# Patient Record
Sex: Female | Born: 1955 | Race: White | Hispanic: No | Marital: Married | Smoking: Never smoker
Health system: Southern US, Community
[De-identification: ages and names within clinical notes are randomized; demographics above are authoritative.]

## PROBLEM LIST (undated history)

## (undated) DIAGNOSIS — M1990 Unspecified osteoarthritis, unspecified site: Secondary | ICD-10-CM

## (undated) DIAGNOSIS — K219 Gastro-esophageal reflux disease without esophagitis: Secondary | ICD-10-CM

## (undated) DIAGNOSIS — F32A Depression, unspecified: Secondary | ICD-10-CM

## (undated) DIAGNOSIS — C4490 Unspecified malignant neoplasm of skin, unspecified: Secondary | ICD-10-CM

## (undated) DIAGNOSIS — E079 Disorder of thyroid, unspecified: Secondary | ICD-10-CM

## (undated) DIAGNOSIS — E785 Hyperlipidemia, unspecified: Secondary | ICD-10-CM

## (undated) DIAGNOSIS — E119 Type 2 diabetes mellitus without complications: Secondary | ICD-10-CM

## (undated) DIAGNOSIS — K635 Polyp of colon: Secondary | ICD-10-CM

## (undated) DIAGNOSIS — C44310 Basal cell carcinoma of skin of unspecified parts of face: Secondary | ICD-10-CM

## (undated) DIAGNOSIS — K589 Irritable bowel syndrome without diarrhea: Secondary | ICD-10-CM

## (undated) DIAGNOSIS — I10 Essential (primary) hypertension: Secondary | ICD-10-CM

## (undated) DIAGNOSIS — F419 Anxiety disorder, unspecified: Secondary | ICD-10-CM

## (undated) DIAGNOSIS — K449 Diaphragmatic hernia without obstruction or gangrene: Secondary | ICD-10-CM

## (undated) DIAGNOSIS — G4733 Obstructive sleep apnea (adult) (pediatric): Principal | ICD-10-CM

## (undated) DIAGNOSIS — Z201 Contact with and (suspected) exposure to tuberculosis: Secondary | ICD-10-CM

## (undated) DIAGNOSIS — R87619 Unspecified abnormal cytological findings in specimens from cervix uteri: Secondary | ICD-10-CM

## (undated) DIAGNOSIS — IMO0002 Reserved for concepts with insufficient information to code with codable children: Secondary | ICD-10-CM

## (undated) DIAGNOSIS — G43909 Migraine, unspecified, not intractable, without status migrainosus: Secondary | ICD-10-CM

## (undated) DIAGNOSIS — R7611 Nonspecific reaction to tuberculin skin test without active tuberculosis: Secondary | ICD-10-CM

## (undated) DIAGNOSIS — F329 Major depressive disorder, single episode, unspecified: Secondary | ICD-10-CM

## (undated) HISTORY — DX: Type 2 diabetes mellitus without complications: E11.9

## (undated) HISTORY — PX: COLONOSCOPY: SHX174

## (undated) HISTORY — DX: Major depressive disorder, single episode, unspecified: F32.9

## (undated) HISTORY — DX: Disorder of thyroid, unspecified: E07.9

## (undated) HISTORY — DX: Irritable bowel syndrome, unspecified: K58.9

## (undated) HISTORY — DX: Nonspecific reaction to tuberculin skin test without active tuberculosis: R76.11

## (undated) HISTORY — DX: Unspecified abnormal cytological findings in specimens from cervix uteri: R87.619

## (undated) HISTORY — DX: Contact with and (suspected) exposure to tuberculosis: Z20.1

## (undated) HISTORY — DX: Unspecified malignant neoplasm of skin, unspecified: C44.90

## (undated) HISTORY — DX: Essential (primary) hypertension: I10

## (undated) HISTORY — DX: Gastro-esophageal reflux disease without esophagitis: K21.9

## (undated) HISTORY — DX: Hyperlipidemia, unspecified: E78.5

## (undated) HISTORY — PX: POLYPECTOMY: SHX149

## (undated) HISTORY — DX: Obstructive sleep apnea (adult) (pediatric): G47.33

## (undated) HISTORY — DX: Reserved for concepts with insufficient information to code with codable children: IMO0002

## (undated) HISTORY — DX: Anxiety disorder, unspecified: F41.9

## (undated) HISTORY — DX: Unspecified osteoarthritis, unspecified site: M19.90

## (undated) HISTORY — DX: Depression, unspecified: F32.A

## (undated) HISTORY — DX: Polyp of colon: K63.5

## (undated) HISTORY — DX: Basal cell carcinoma of skin of unspecified parts of face: C44.310

## (undated) HISTORY — DX: Migraine, unspecified, not intractable, without status migrainosus: G43.909

## (undated) HISTORY — DX: Diaphragmatic hernia without obstruction or gangrene: K44.9

---

## 1980-05-24 DIAGNOSIS — IMO0002 Reserved for concepts with insufficient information to code with codable children: Secondary | ICD-10-CM

## 1980-05-24 HISTORY — DX: Reserved for concepts with insufficient information to code with codable children: IMO0002

## 1986-05-24 DIAGNOSIS — R87619 Unspecified abnormal cytological findings in specimens from cervix uteri: Secondary | ICD-10-CM

## 1986-05-24 HISTORY — DX: Unspecified abnormal cytological findings in specimens from cervix uteri: R87.619

## 1986-05-24 HISTORY — PX: VAGINAL HYSTERECTOMY: SUR661

## 1993-05-24 DIAGNOSIS — R7611 Nonspecific reaction to tuberculin skin test without active tuberculosis: Secondary | ICD-10-CM

## 1993-05-24 HISTORY — DX: Nonspecific reaction to tuberculin skin test without active tuberculosis: R76.11

## 2000-01-14 ENCOUNTER — Other Ambulatory Visit: Admission: RE | Admit: 2000-01-14 | Discharge: 2000-01-14 | Payer: Self-pay | Admitting: Obstetrics and Gynecology

## 2001-01-17 ENCOUNTER — Other Ambulatory Visit: Admission: RE | Admit: 2001-01-17 | Discharge: 2001-01-17 | Payer: Self-pay | Admitting: Obstetrics and Gynecology

## 2002-01-19 ENCOUNTER — Other Ambulatory Visit: Admission: RE | Admit: 2002-01-19 | Discharge: 2002-01-19 | Payer: Self-pay | Admitting: Obstetrics and Gynecology

## 2003-01-21 ENCOUNTER — Other Ambulatory Visit: Admission: RE | Admit: 2003-01-21 | Discharge: 2003-01-21 | Payer: Self-pay | Admitting: Obstetrics and Gynecology

## 2004-01-28 ENCOUNTER — Other Ambulatory Visit: Admission: RE | Admit: 2004-01-28 | Discharge: 2004-01-28 | Payer: Self-pay | Admitting: Obstetrics and Gynecology

## 2004-03-10 ENCOUNTER — Encounter
Admission: RE | Admit: 2004-03-10 | Discharge: 2004-06-08 | Payer: Self-pay | Admitting: Physical Medicine and Rehabilitation

## 2004-05-24 HISTORY — PX: MOHS SURGERY: SHX181

## 2006-02-18 ENCOUNTER — Encounter: Admission: RE | Admit: 2006-02-18 | Discharge: 2006-02-18 | Payer: Self-pay | Admitting: Obstetrics and Gynecology

## 2006-03-01 ENCOUNTER — Ambulatory Visit: Admission: RE | Admit: 2006-03-01 | Discharge: 2006-03-01 | Payer: Self-pay | Admitting: Gynecologic Oncology

## 2006-05-24 DIAGNOSIS — C44310 Basal cell carcinoma of skin of unspecified parts of face: Secondary | ICD-10-CM

## 2006-05-24 HISTORY — DX: Basal cell carcinoma of skin of unspecified parts of face: C44.310

## 2006-06-21 ENCOUNTER — Ambulatory Visit: Payer: Self-pay | Admitting: Internal Medicine

## 2006-06-24 LAB — HM COLONOSCOPY

## 2006-07-04 ENCOUNTER — Encounter (INDEPENDENT_AMBULATORY_CARE_PROVIDER_SITE_OTHER): Payer: Self-pay | Admitting: *Deleted

## 2006-07-04 ENCOUNTER — Ambulatory Visit: Payer: Self-pay | Admitting: Internal Medicine

## 2006-07-04 DIAGNOSIS — K5730 Diverticulosis of large intestine without perforation or abscess without bleeding: Secondary | ICD-10-CM | POA: Insufficient documentation

## 2006-07-04 DIAGNOSIS — D126 Benign neoplasm of colon, unspecified: Secondary | ICD-10-CM | POA: Insufficient documentation

## 2007-02-15 ENCOUNTER — Ambulatory Visit: Payer: Self-pay | Admitting: Internal Medicine

## 2007-08-04 DIAGNOSIS — K589 Irritable bowel syndrome without diarrhea: Secondary | ICD-10-CM | POA: Insufficient documentation

## 2007-08-04 DIAGNOSIS — K219 Gastro-esophageal reflux disease without esophagitis: Secondary | ICD-10-CM

## 2008-03-22 ENCOUNTER — Other Ambulatory Visit: Admission: RE | Admit: 2008-03-22 | Discharge: 2008-03-22 | Payer: Self-pay | Admitting: Obstetrics and Gynecology

## 2009-05-24 DIAGNOSIS — E079 Disorder of thyroid, unspecified: Secondary | ICD-10-CM

## 2009-05-24 HISTORY — PX: THYROID SURGERY: SHX805

## 2009-05-24 HISTORY — DX: Disorder of thyroid, unspecified: E07.9

## 2010-02-26 ENCOUNTER — Encounter (HOSPITAL_COMMUNITY)
Admission: RE | Admit: 2010-02-26 | Discharge: 2010-05-22 | Payer: Self-pay | Source: Home / Self Care | Attending: Endocrinology | Admitting: Endocrinology

## 2010-05-22 ENCOUNTER — Ambulatory Visit (HOSPITAL_COMMUNITY)
Admission: RE | Admit: 2010-05-22 | Discharge: 2010-05-22 | Payer: Self-pay | Source: Home / Self Care | Attending: Endocrinology | Admitting: Endocrinology

## 2010-09-18 ENCOUNTER — Encounter: Payer: Self-pay | Admitting: Nurse Practitioner

## 2010-09-18 DIAGNOSIS — I10 Essential (primary) hypertension: Secondary | ICD-10-CM | POA: Insufficient documentation

## 2010-09-18 DIAGNOSIS — E0590 Thyrotoxicosis, unspecified without thyrotoxic crisis or storm: Secondary | ICD-10-CM

## 2010-09-18 DIAGNOSIS — E785 Hyperlipidemia, unspecified: Secondary | ICD-10-CM

## 2010-09-18 DIAGNOSIS — G4700 Insomnia, unspecified: Secondary | ICD-10-CM

## 2010-09-18 DIAGNOSIS — E039 Hypothyroidism, unspecified: Secondary | ICD-10-CM | POA: Insufficient documentation

## 2010-09-18 DIAGNOSIS — E8881 Metabolic syndrome: Secondary | ICD-10-CM

## 2010-10-06 NOTE — Assessment & Plan Note (Signed)
[facility]                         GASTROENTEROLOGY OFFICE NOTE   [name], [name]                    MRN:          [id number]  DATE:02/15/2007                            DOB:          06-15-1955    [name] is a very nice 55 year old white female with a history of  gastroesophageal reflux and irritable bowel syndrome. She is here today  to refill her Bentyl. We had her on Levsin sublingually for a long time,  but because the manufacturer stopped making Levsin, she was switched to  Bentyl 10 mg which she takes about 2 or 3 times a week. We did a  colonoscopy on her earlier this year in February 2008 with findings of  two small polyps, one of which was suggestive of neuroma.   She is currently asymptomatic as far as the reflux is concerned being  on:  1. Prilosec 20 mg p.o. b.i.d.  2. Lexapro 20 mg p.o. daily  3. Lipitor 10 mg p.o. daily.  4. Calcium.  5. Mild thistle.  6. Multiple vitamins.  7. Hydrochlorothiazide 12.5 mg 1 p.o. daily.  8. Atenolol 100 mg p.o. daily.  9. Dicyclomine 10 mg p.r.n.   The patient came today to refill her medications.   IMPRESSION:  1. Irritable bowel syndrome.  2. Gastroesophageal reflux, well controlled.   PLAN:  Refill for Bentyl, 6 refills. I will see her on a p.r.n. basis.     Hedwig Morton. Juanda Chance, MD  Electronically Signed    DMB/MedQ  DD: 02/15/2007  DT: 02/16/2007  Job #: [id number]   cc:   Ernestina Penna, M.D.

## 2010-10-09 NOTE — Assessment & Plan Note (Signed)
[facility]                                 ON-CALL NOTE   TOLUWANI, RUDER                    MRN:          [id number]  DATE:07/04/2006                            DOB:          [birth year]    PHONE NUMBER:  [phone].   TIME OF CALL:  7:15 p.m.   I received a call this evening from the patient's husband stating that  his wife underwent colonoscopy today and was having nausea and vomiting.  I then spoke with the patient. After leaving the [facility], she  apparently had a large supper including fettuccine at the [employer].  They acknowledged that this was contrary to discharge instructions. She  subsequently developed problems with abdominal cramping, nausea and  vomiting. She has had one bowel movement with darkish fluid though no  blood. She is having cramping abdominal discomfort though no severe  pain. Currently, she is most troubled by dry heaves. She asks if I might  not call a prescription in for nausea. The patient lives in [place].  We discussed that nausea, vomiting and discomfort may be due to  overeating in the face of sedation and retained air post colonoscopy. I  was very clear however that if she were to develop significant pain that  she should be evaluated immediately. They were agreeable. I called in  Phenergan 25 mg once every 4-6h as needed for nausea to [facility]. I  asked her not to eat but only sip warm liquids until feeling better.  Again, they know to contact me or the office if her problems persist,  worsen or change in a negative manner.     Wilhemina Bonito. Marina Goodell, MD  Electronically Signed    JNP/MedQ  DD: 07/04/2006  DT: 07/05/2006  Job #: [id number]   cc:   Hedwig Morton. Juanda Chance, MD  Iva Boop, MD,FACG

## 2010-10-09 NOTE — Consult Note (Signed)
NAMEJULYANA, [name]           ACCOUNT NO.:  [id number]   MEDICAL RECORD NO.:  [id number]          PATIENT TYPE:  OUT   LOCATION:  GYN                          FACILITY:  [facility]   PHYSICIAN:  John T. Kyla Balzarine, M.D.    DATE OF BIRTH:  [birth year]   DATE OF CONSULTATION:  DATE OF DISCHARGE:                                   CONSULTATION   CHIEF COMPLAINT:  This patient is seen at the request of Dr. Tresa Res for  recommendations regarding persistent adnexal cyst.  Unfortunately, I only  have information from her last visit and ultrasound.   HISTORY OF PRESENT ILLNESS:  This patient has been followed with multiple  ultrasounds for pelvic cysts and states that she will alternate cysts on  the right and left side.  She states that at approximately a year ago she  had cyst on the on the left side.  CA-125 value has been normal on several  occasions, most recently February 16, 2006.  The patient is asymptomatic  and ultrasound performed February 16, 2006, revealed difficulty in  determining the ovaries because of stool and gas.  A 1.6-cm smooth cyst was  seen in the left adnexa.  In the right adnexa, there were small cysts.  An  MRI of the pelvis was performed on February 18, 2006.  The right ovary was  found to measure 3.5 x 2.6 x 2.3-cm with a 1.7-cm cystic lesion compatible  with complex cyst posteriorly, a second cystic lesion medially measuring 1.6  x 1.9-cm, and a third anterior 1.4 x 1.1-cm likely simple cyst.  Immediately  adjacent to the right ovary an 11-mm simple cyst was present.  The left  ovary measured 3.9 x 2.4 x 3-cm and contained a 2-cm x 2.3-cm compatible  with a simple cyst, and a 1.6 x 1-cm second simple cyst.  Of note, the  patient is post vaginal hysterectomy in the past for endometriosis.  She  states that she has had normal LH and FSH values, although these are not  available for review.   PAST MEDICAL HISTORY:  The patient denies major comorbidities.  1. She is  status post TVH.  2. She has anxiety.  3. Low grade, non suicidal depression.   MEDICATIONS:  Include Lexapro, clonazepam, potassium supplementation, and  multivitamins.   ALLERGIES:  NONE KNOWN.   PERSONAL SOCIAL HISTORY:  Denies tobacco or ethanol use.   FAMILY MEDICAL HISTORY:  Noncontributory without gynecologic malignancy.   PHYSICAL EXAMINATION:  GENERAL:  The patient is anxious, alert, and oriented  x3 in no acute distress.  VITAL SIGNS:  Weight 147 pounds and vital signs stable as reflected in the  patient's chart.  LYMPH NODES:  There is no pathologic lymphadenopathy.  LUNGS:  Fields clear.  There is no back or CVA tenderness.  ABDOMEN:  Obese, soft, and benign with no ascites, mass, organomegaly.  EXTREMITIES:  Extremities have full strength and range of motion without  edema.  PELVIC:  External genitalia and BUS are normal.  Bladder and urethra are  normal.  Vaginal mucosa clear.  Bimanual and rectovaginal examinations  reveal  absent uterus and cervix.  No mass or nodularity.   ASSESSMENT:  Likely benign cysts in a patient nearing the end of ovarian  function who continues to have ovulation and has had prior vaginal  hysterectomy.   From my review of the scans and her history, I believe this is  overwhelmingly benign.   RECOMMENDATIONS:  I would recommend the patient be followed at least  symptomatically and with an exam in 6 months.      John T. Kyla Balzarine, M.D.  Electronically Signed     JTS/MEDQ  D:  03/01/2006  T:  03/02/2006  Job:  [id number]   cc:   Telford Nab, R.N.  [address]  [place], [place] [address]   Edwena Felty. Romine, M.D.  Fax: [phone]

## 2010-10-09 NOTE — Assessment & Plan Note (Signed)
[facility]                                 ON-CALL NOTE   [name], [name]                    MRN:          [id number]  DATE:01/09/2007                            DOB:          July 12, 1955    This is a patient of Dr. Regino Schultze with irritable bowel syndrome,  longstanding.  She had been doing well on hyoscyamine, but due to the  lack of production of that, she needs a prescription to tide her over  until she sees Dr. Juanda Chance in followup next month.  I have called in  dicyclomine 10 mg 1 every 6 hours as needed, #120 with 1 refill.     Iva Boop, MD,FACG  Electronically Signed    CEG/MedQ  DD: 01/09/2007  DT: 01/10/2007  Job #: [id number]

## 2011-07-16 ENCOUNTER — Encounter: Payer: Self-pay | Admitting: Internal Medicine

## 2011-08-26 ENCOUNTER — Ambulatory Visit (AMBULATORY_SURGERY_CENTER): Payer: BC Managed Care – PPO | Admitting: *Deleted

## 2011-08-26 VITALS — Ht 65.5 in | Wt 285.0 lb

## 2011-08-26 DIAGNOSIS — Z1211 Encounter for screening for malignant neoplasm of colon: Secondary | ICD-10-CM

## 2011-08-26 MED ORDER — PEG-KCL-NACL-NASULF-NA ASC-C 100 G PO SOLR: ORAL | Status: DC

## 2011-08-26 NOTE — Progress Notes (Signed)
Pt. States she has terrible reflux at night and Her PCP changed her PPI to dexilant which is somewhat better than the Prilosec.  I gave her Reflux information and the Pamphlet on Correction of Acid Reflux by Laparoscopy.  She Has had aspiration pneumonia and is looking for a more permanent solution.. I advised her to discuss it with you.

## 2011-08-27 ENCOUNTER — Encounter: Payer: Self-pay | Admitting: Internal Medicine

## 2011-09-02 ENCOUNTER — Ambulatory Visit: Payer: BC Managed Care – PPO | Attending: Specialist | Admitting: Physical Therapy

## 2011-09-02 DIAGNOSIS — IMO0001 Reserved for inherently not codable concepts without codable children: Secondary | ICD-10-CM | POA: Insufficient documentation

## 2011-09-02 DIAGNOSIS — R5381 Other malaise: Secondary | ICD-10-CM | POA: Insufficient documentation

## 2011-09-02 DIAGNOSIS — M25569 Pain in unspecified knee: Secondary | ICD-10-CM | POA: Insufficient documentation

## 2011-09-03 ENCOUNTER — Ambulatory Visit: Payer: BC Managed Care – PPO | Admitting: Physical Therapy

## 2011-09-06 ENCOUNTER — Ambulatory Visit: Payer: BC Managed Care – PPO | Admitting: Physical Therapy

## 2011-09-08 ENCOUNTER — Encounter: Payer: Self-pay | Admitting: Internal Medicine

## 2011-09-08 ENCOUNTER — Ambulatory Visit (AMBULATORY_SURGERY_CENTER): Payer: BC Managed Care – PPO | Admitting: Internal Medicine

## 2011-09-08 VITALS — BP 158/95 | HR 57 | Temp 98.2°F | Resp 16 | Ht 66.0 in | Wt 285.0 lb

## 2011-09-08 DIAGNOSIS — D128 Benign neoplasm of rectum: Secondary | ICD-10-CM

## 2011-09-08 DIAGNOSIS — D126 Benign neoplasm of colon, unspecified: Secondary | ICD-10-CM

## 2011-09-08 DIAGNOSIS — D129 Benign neoplasm of anus and anal canal: Secondary | ICD-10-CM

## 2011-09-08 DIAGNOSIS — Z1211 Encounter for screening for malignant neoplasm of colon: Secondary | ICD-10-CM

## 2011-09-08 MED ORDER — SODIUM CHLORIDE 0.9 % IV SOLN: 500.0000 mL | INTRAVENOUS | Status: DC

## 2011-09-08 NOTE — Progress Notes (Signed)
Patient did not experience any of the following events: a burn prior to discharge; a fall within the facility; wrong site/side/patient/procedure/implant event; or a hospital transfer or hospital admission upon discharge from the facility. (G8907) Patient did not have preoperative order for IV antibiotic SSI prophylaxis. (G8918)  

## 2011-09-08 NOTE — Op Note (Addendum)
[facility] [address] [place], [place]  [address]  COLONOSCOPY PROCEDURE REPORT  PATIENT:  Olivia, Fowler  MR#:  [id number] BIRTHDATE:  [birth year], 55 yrs. old  GENDER:  female ENDOSCOPIST:  Hedwig Morton. Juanda Chance, MD REF. BY:  Wardell Heath, N.P. PROCEDURE DATE:  09/08/2011 PROCEDURE:  Colonoscopy with biopsy ASA CLASS:  Class III INDICATIONS:  history of polyps colon 2005-polyp colon 2008- neuroma/polyp MEDICATIONS:   MAC sedation, administered by CRNA, propofol (Diprivan) 300 mg  DESCRIPTION OF PROCEDURE:   After the risks and benefits and of the procedure were explained, informed consent was obtained. Digital rectal exam was performed and revealed no rectal masses. The LB 180AL [id number] endoscope was introduced through the anus and advanced to the cecum, which was identified by both the appendix and ileocecal valve.  The quality of the prep was good, using MoviPrep.  The instrument was then slowly withdrawn as the colon was fully examined. <<PROCEDUREIMAGES>>  FINDINGS:  A diminutive polyp was found in the rectum. 3 mm polyp at 5 cm The polyp was removed using cold biopsy forceps (see image7 and image6).  This was otherwise a normal examination of the colon (see image5, image4, image3, and image1).   Retroflexed views in the rectum revealed no abnormalities.    The scope was then withdrawn from the patient and the procedure completed.  COMPLICATIONS:  None ENDOSCOPIC IMPRESSION: 1) Diminutive polyp in the rectum 2) Otherwise normal examination RECOMMENDATIONS: 1) Await pathology results 2) High fiber diet.  REPEAT EXAM:  7-10  ______________________________ Hedwig Morton. Juanda Chance, MD  CC:  n. REVISED:  09/08/2011 10:58 AM eSIGNED:   Hedwig Morton. Jasmane Brockway at 09/08/2011 10:58 AM  Chong Sicilian, [phone]

## 2011-09-08 NOTE — Patient Instructions (Signed)
YOU HAD AN ENDOSCOPIC PROCEDURE TODAY AT THE [facility]: Refer to the procedure report that was given to you for any specific questions about what was found during the examination.  If the procedure report does not answer your questions, please call your gastroenterologist to clarify.  If you requested that your care partner not be given the details of your procedure findings, then the procedure report has been included in a sealed envelope for you to review at your convenience later.  YOU SHOULD EXPECT: Some feelings of bloating in the abdomen. Passage of more gas than usual.  Walking can help get rid of the air that was put into your GI tract during the procedure and reduce the bloating. If you had a lower endoscopy (such as a colonoscopy or flexible sigmoidoscopy) you may notice spotting of blood in your stool or on the toilet paper. If you underwent a bowel prep for your procedure, then you may not have a normal bowel movement for a few days.  DIET: Your first meal following the procedure should be a light meal and then it is ok to progress to your normal diet.  A half-sandwich or bowl of soup is an example of a good first meal.  Heavy or fried foods are harder to digest and may make you feel nauseous or bloated.  Likewise meals heavy in dairy and vegetables can cause extra gas to form and this can also increase the bloating.  Drink plenty of fluids but you should avoid alcoholic beverages for 24 hours.  ACTIVITY: Your care partner should take you home directly after the procedure.  You should plan to take it easy, moving slowly for the rest of the day.  You can resume normal activity the day after the procedure however you should NOT DRIVE or use heavy machinery for 24 hours (because of the sedation medicines used during the test).    SYMPTOMS TO REPORT IMMEDIATELY: A gastroenterologist can be reached at any hour.  During normal business hours, 8:30 AM to 5:00 PM Monday through Friday,  call [phone].  After hours and on weekends, please call the GI answering service at [phone] who will take a message and have the physician on call contact you.   Following lower endoscopy (colonoscopy or flexible sigmoidoscopy):  Excessive amounts of blood in the stool  Significant tenderness or worsening of abdominal pains  Swelling of the abdomen that is new, acute  Fever of 100F or higher    FOLLOW UP: If any biopsies were taken you will be contacted by phone or by letter within the next 1-3 weeks.  Call your gastroenterologist if you have not heard about the biopsies in 3 weeks.  Our staff will call the home number listed on your records the next business day following your procedure to check on you and address any questions or concerns that you may have at that time regarding the information given to you following your procedure. This is a courtesy call and so if there is no answer at the home number and we have not heard from you through the emergency physician on call, we will assume that you have returned to your regular daily activities without incident.  SIGNATURES/CONFIDENTIALITY: You and/or your care partner have signed paperwork which will be entered into your electronic medical record.  These signatures attest to the fact that that the information above on your After Visit Summary has been reviewed and is understood.  Full responsibility of the confidentiality   of this discharge information lies with you and/or your care-partner.     

## 2011-09-08 NOTE — Progress Notes (Signed)
PATIENT STATING SHE HAAS BURSITIS OF LT. KNEE. CAREFULLY POSITIONED PATIENT ON LEFT SIDE WITH PILLOW BETWEEN LEGS.

## 2011-09-09 ENCOUNTER — Telehealth: Payer: Self-pay | Admitting: *Deleted

## 2011-09-10 ENCOUNTER — Ambulatory Visit: Payer: BC Managed Care – PPO | Admitting: *Deleted

## 2011-09-13 ENCOUNTER — Encounter: Payer: Self-pay | Admitting: Internal Medicine

## 2011-09-14 ENCOUNTER — Ambulatory Visit: Payer: BC Managed Care – PPO | Admitting: Physical Therapy

## 2011-09-15 ENCOUNTER — Ambulatory Visit: Payer: BC Managed Care – PPO | Admitting: Physical Therapy

## 2012-06-26 ENCOUNTER — Encounter: Payer: Self-pay | Admitting: *Deleted

## 2012-06-26 ENCOUNTER — Telehealth: Payer: Self-pay | Admitting: Internal Medicine

## 2012-06-26 NOTE — Telephone Encounter (Signed)
Spoke with patient and she is having severe acid reflux. Started getting worse this fall and is now severe. She is taking Dexilant nightly and Zantac OTC in AM. Reports is using anti reflux measures. Scheduled patient on 06/28/12 at 9:00 AM with Dr. Juanda Chance.

## 2012-06-28 ENCOUNTER — Encounter: Payer: Self-pay | Admitting: Internal Medicine

## 2012-06-28 ENCOUNTER — Ambulatory Visit (INDEPENDENT_AMBULATORY_CARE_PROVIDER_SITE_OTHER): Payer: BC Managed Care – PPO | Admitting: Internal Medicine

## 2012-06-28 VITALS — BP 120/80 | HR 66 | Ht 65.5 in | Wt 289.2 lb

## 2012-06-28 DIAGNOSIS — K219 Gastro-esophageal reflux disease without esophagitis: Secondary | ICD-10-CM

## 2012-06-28 MED ORDER — DEXLANSOPRAZOLE 60 MG PO CPDR: 60.0000 mg | DELAYED_RELEASE_CAPSULE | Freq: Two times a day (BID) | ORAL | Status: DC

## 2012-06-28 MED ORDER — METOCLOPRAMIDE HCL 5 MG PO TABS: 5.0000 mg | ORAL_TABLET | Freq: Every day | ORAL | Status: DC

## 2012-06-28 NOTE — Progress Notes (Signed)
[name] [birth year] MRN [id number]   History of Present Illness:  This is a 57 year old white female with severe gastroesophageal reflux. We have seen her in the past for a colorectal screening. Her last colonoscopy in April 2013 showed a hyperplastic polyp. She has had several episodes of waking up at night with severe burning cough and chest burning. She has had hoarseness after each of these episodes. She is on Dexilant 60 mg at bedtime and ranitidine 150 mg in the morning. She has gained about 50 pounds in the last 2 years partly due to thyroid disease. She had 2 episodes of pneumonia last year. She denies any reflux during the day. She sleeps with the head of the bed elevated and eats supper at 5:30 PM.sSe denies dysphagia or odynophagia. She denies any problems with previous delayed gastric emptying.  Past Medical History  Diagnosis Date  . Anxiety   . Arthritis   . Cancer     basal cell and 1 squamous cell on face and neck  . Depression   . GERD (gastroesophageal reflux disease)   . Hypertension   . Hyperlipidemia   . Tuberculosis 1995    Positive TB test/negative chest xray  . Ulcer 1982    gastric  . Thyroid disease     hypothyroid/ radio active iodine  . Hyperplastic colonic polyp   . IBS (irritable bowel syndrome)    Past Surgical History  Procedure Date  . Vaginal hysterectomy 1983  . Mos   . Mohs surgery 2006    left side of face    reports that she has quit smoking. She has never used smokeless tobacco. She reports that she does not drink alcohol or use illicit drugs. family history is not on file.  She is adopted. Allergies  Allergen Reactions  . Lipitor (Atorvastatin Calcium)     myalgia  . Pravastatin Other (See Comments)    Severe pain in feet  . Zocor (Simvastatin - High Dose)     myalgia        Review of Systems: No abdominal pain diarrhea or constipation  The remainder of the 10 point ROS is negative except as outlined in  H&P   Physical Exam: General appearance  Well developed, in no distress. Obese Eyes- non icteric. HEENT nontraumatic, normocephalic. Mouth no lesions, tongue papillated, no cheilosis. Neck supple without adenopathy, thyroid not enlarged, no carotid bruits, no JVD. Lungs Clear to auscultation bilaterally. Cor normal S1, normal S2, regular rhythm, no murmur,  quiet precordium. Abdomen: Large obese abdomen nontender with normoactive bowel sounds. No ascites. Rectal: Not done Extremities no pedal edema. Skin no lesions. Neurological alert and oriented x 3. Psychological normal mood and affect.  Assessment and Plan:  Problem #1 Severe gastroesophageal reflux disease refractory to proton pump inhibitors as well as episodes of nocturnal aspiration. She has a history of pneumonia on 2 occasions last year.  She needs to be fully evaluated and treated aggressively. He have discussed weight loss. She will continue strict antireflux measures. We will proceed with an upper endoscopy and biopsies to rule out Barrett's esophagus. She will also likely need a barium esophagram and consideration of Nissen fundoplication. We will add Reglan 5 mg at bedtime and increase her Dexilant to 60 mg twice a day. I have also mentioned the possibly to of gastric bypass or lap band for weight loss.  Problem #2 Colorectal screening. She is up-to-date on her colonoscopy. Her next exam will be due in 2023.  06/28/2012 [name]

## 2012-06-28 NOTE — Patient Instructions (Addendum)
You have been scheduled for an endoscopy with propofol. Please follow written instructions given to you at your visit today. If you use inhalers (even only as needed) or a CPAP machine, please bring them with you on the day of your procedure.  We have sent the following medications to your pharmacy for you to pick up at your convenience: Dexilant twice daily Reglan 5 mg at bedtime  Continue to take Maalox as needed  We have given you a brochure on Correction of Acid Reflux by Laparoscopy as well as a brochure on GERD to read over.  CC: Dr Paulene Floor

## 2012-06-30 ENCOUNTER — Encounter: Payer: Self-pay | Admitting: Internal Medicine

## 2012-06-30 ENCOUNTER — Telehealth: Payer: Self-pay | Admitting: *Deleted

## 2012-06-30 ENCOUNTER — Ambulatory Visit (AMBULATORY_SURGERY_CENTER): Payer: BC Managed Care – PPO | Admitting: Internal Medicine

## 2012-06-30 VITALS — BP 140/99 | HR 59 | Temp 99.0°F | Resp 18 | Ht 65.0 in | Wt 285.0 lb

## 2012-06-30 DIAGNOSIS — G4734 Idiopathic sleep related nonobstructive alveolar hypoventilation: Secondary | ICD-10-CM

## 2012-06-30 DIAGNOSIS — T17800A Unspecified foreign body in other parts of respiratory tract causing asphyxiation, initial encounter: Secondary | ICD-10-CM

## 2012-06-30 DIAGNOSIS — R05 Cough: Secondary | ICD-10-CM

## 2012-06-30 DIAGNOSIS — K219 Gastro-esophageal reflux disease without esophagitis: Secondary | ICD-10-CM

## 2012-06-30 DIAGNOSIS — K2990 Gastroduodenitis, unspecified, without bleeding: Secondary | ICD-10-CM

## 2012-06-30 DIAGNOSIS — K319 Disease of stomach and duodenum, unspecified: Secondary | ICD-10-CM

## 2012-06-30 DIAGNOSIS — K2970 Gastritis, unspecified, without bleeding: Secondary | ICD-10-CM

## 2012-06-30 MED ORDER — SODIUM CHLORIDE 0.9 % IV SOLN: 500.0000 mL | INTRAVENOUS | Status: DC

## 2012-06-30 NOTE — Patient Instructions (Addendum)

## 2012-06-30 NOTE — Progress Notes (Signed)
Called to room to assist during endoscopic procedure.  Patient ID and intended procedure confirmed with present staff. Received instructions for my participation in the procedure from the performing physician.  

## 2012-06-30 NOTE — Progress Notes (Signed)
Patient did not experience any of the following events: a burn prior to discharge; a fall within the facility; wrong site/side/patient/procedure/implant event; or a hospital transfer or hospital admission upon discharge from the facility. [phone]) Patient did not have preoperative order for IV antibiotic SSI prophylaxis. [phone])   1600-sat patient in chairs in recovery waiting for the nurse from down stairs to call with appointment for barium esophagram, Rene Kocher has been called down stairs and message was left at [phone].

## 2012-06-30 NOTE — Progress Notes (Signed)
Pt stable throughtout procedure HOB elevated  Pt fully awake to PACU

## 2012-06-30 NOTE — Telephone Encounter (Signed)
Per Dr. Juanda Chance, needs barium esophagram. Scheduled at [facility] radiology([place]) on 07/05/12 at 9:15/9:30 AM. No prep. April at [facility] recovery given information to give patient.

## 2012-06-30 NOTE — Op Note (Signed)
[facility] [address] [place], [address]   ENDOSCOPY PROCEDURE REPORT  PATIENT: [name], [name]  MR#: [id number] BIRTHDATE: 27-Sep-1955 , 56  yrs. old GENDER: Female ENDOSCOPIST: Hart Carwin, MD REFERRED BY:  Wardell Heath, N.P. PROCEDURE DATE:  06/30/2012 PROCEDURE:  EGD w/ biopsy ASA CLASS:     Class III INDICATIONS:  Heartburn.   aspirating at night, coughing, hoarsness, sleeps in a recliner, refractory to PPI's. MEDICATIONS: MAC sedation, administered by CRNA and Fentanyl-Detailed 70 mg IV TOPICAL ANESTHETIC: Cetacaine Spray  DESCRIPTION OF PROCEDURE: After the risks benefits and alternatives of the procedure were thoroughly explained, informed consent was obtained.  The LB GIF-H180 [id number] endoscope was introduced through the mouth and advanced to the second portion of the duodenum. Without limitations.  The instrument was slowly withdrawn as the mucosa was fully examined.      esophagus. Subcutaneous mucosa appeared normal throughout the proximal and mid esophagus. Z line appeared slightly irregular. Biopsies were taken to rule out Barrett's esophagus. There was a small hiatal hernia extending 1-2 cm above the Z line it was reducible. There was no definite stricture.  Stomach stomach was insufflated with air and showed normal-appearing gastric folds. There was intense erythema and nodularity of the gastric antrum consistent with acute gastritis. Biopsies were taken from gastric antrum to rule out H. pylori. Gastric outlet was normal. There was no retention of the food or liquid in the stomach. Duodenum duodenal bulb and descending duodenal are normal endoscope was brought back into the stomach retroflexed. Fundus and cardia appeared normal[          The scope was then withdrawn from the patient and the procedure completed.  COMPLICATIONS: There were no complications. ENDOSCOPIC IMPRESSION:  hiatal hernia with gastroesophageal reflux. No  evidence of stricture. Status post biopsies from Z line Moderately severe antral gastritis. Status post biopsies RECOMMENDATIONS:  patient has had severe gastroesophageal reflux which occurs nocturnally and causes aspiration and cough. Her oxygen saturation dropped during the exam to 88% on 4 L of O2. She needs to be evaluated for obstructive sleep apnea and for aspiration. We will set up  pulmonary appointment for her. In the meantime she will continue on proton pump inhibitor and strict antireflux measures. We will obtain barium esophagram to assess esophageal motility in preparation for possible Nissen fundoplication  REPEAT EXAM: no recall  eSigned:  Hart Carwin, MD 06/30/2012 3:27 PM   CC:  PATIENT NAME:  Alessandria, Henken MR#: [id number]

## 2012-06-30 NOTE — Progress Notes (Signed)
No egg or soy allergy. ewm 

## 2012-06-30 NOTE — Telephone Encounter (Signed)
Per Dr. Juanda Chance, patient needs pulmonary referral for hypoxia during procedure(88%) Scheduled patient with Dr. Craige Cotta on 07/19/12 11:00 AM.April in [facility] recovery given this information to give patient.

## 2012-07-03 ENCOUNTER — Telehealth: Payer: Self-pay | Admitting: *Deleted

## 2012-07-03 NOTE — Telephone Encounter (Signed)
  Follow up Call-  Call back number 06/30/2012 09/08/2011  Post procedure Call Back phone  # [phone]  Permission to leave phone message Yes Yes     Patient questions:  Do you have a fever, pain , or abdominal swelling? no Pain Score  0 *  Have you tolerated food without any problems? yes  Have you been able to return to your normal activities? yes  Do you have any questions about your discharge instructions: Diet   no Medications  no Follow up visit  no  Do you have questions or concerns about your Care? no  Actions: * If pain score is 4 or above: No action needed, pain <4.

## 2012-07-04 ENCOUNTER — Ambulatory Visit (HOSPITAL_COMMUNITY)
Admission: RE | Admit: 2012-07-04 | Discharge: 2012-07-04 | Disposition: A | Discharge status: Home / Self Care | Payer: BC Managed Care – PPO | Source: Ambulatory Visit | Attending: Internal Medicine | Admitting: Internal Medicine

## 2012-07-04 ENCOUNTER — Encounter: Payer: Self-pay | Admitting: Internal Medicine

## 2012-07-04 DIAGNOSIS — Z8701 Personal history of pneumonia (recurrent): Secondary | ICD-10-CM | POA: Insufficient documentation

## 2012-07-04 DIAGNOSIS — K449 Diaphragmatic hernia without obstruction or gangrene: Secondary | ICD-10-CM | POA: Insufficient documentation

## 2012-07-04 DIAGNOSIS — K219 Gastro-esophageal reflux disease without esophagitis: Secondary | ICD-10-CM | POA: Insufficient documentation

## 2012-07-05 ENCOUNTER — Other Ambulatory Visit (HOSPITAL_COMMUNITY): Payer: BC Managed Care – PPO

## 2012-07-14 ENCOUNTER — Other Ambulatory Visit: Payer: Self-pay | Admitting: Physician Assistant

## 2012-07-19 ENCOUNTER — Telehealth: Payer: Self-pay | Admitting: *Deleted

## 2012-07-19 ENCOUNTER — Encounter: Payer: Self-pay | Admitting: Pulmonary Disease

## 2012-07-19 ENCOUNTER — Encounter: Payer: Self-pay | Admitting: *Deleted

## 2012-07-19 ENCOUNTER — Ambulatory Visit (INDEPENDENT_AMBULATORY_CARE_PROVIDER_SITE_OTHER)
Admission: RE | Admit: 2012-07-19 | Discharge: 2012-07-19 | Disposition: A | Discharge status: Home / Self Care | Payer: BC Managed Care – PPO | Source: Ambulatory Visit | Attending: Pulmonary Disease | Admitting: Pulmonary Disease

## 2012-07-19 ENCOUNTER — Ambulatory Visit (INDEPENDENT_AMBULATORY_CARE_PROVIDER_SITE_OTHER): Payer: BC Managed Care – PPO | Admitting: Pulmonary Disease

## 2012-07-19 VITALS — BP 116/84 | HR 75 | Temp 98.0°F | Ht 65.5 in | Wt 270.6 lb

## 2012-07-19 DIAGNOSIS — R0683 Snoring: Secondary | ICD-10-CM

## 2012-07-19 NOTE — Progress Notes (Deleted)
  Subjective:    Patient ID: [name], female    DOB: [birth year], 57 y.o.   MRN: [id number]  HPI    Review of Systems  Constitutional: Positive for unexpected weight change. Negative for appetite change.  HENT: Positive for congestion, sore throat, sneezing and trouble swallowing. Negative for ear pain and dental problem.   Respiratory: Positive for cough and shortness of breath.   Cardiovascular: Positive for leg swelling. Negative for chest pain and palpitations.  Gastrointestinal: Positive for abdominal pain.  Musculoskeletal: Positive for arthralgias. Negative for joint swelling.  Skin: Negative for rash.  Neurological: Negative for headaches.  Psychiatric/Behavioral: Positive for dysphoric mood. The patient is nervous/anxious.        Objective:   Physical Exam        Assessment & Plan:

## 2012-07-19 NOTE — Patient Instructions (Signed)
Will arrange for breathing test (PFT) and sleep study Chest xray today Follow up in 2 weeks

## 2012-07-19 NOTE — Progress Notes (Signed)
Chief Complaint  Patient presents with  . Advice Only    c/o loud snoring, stops breathing at night    History of Present Illness: [name] is a 57 y.o. female former smoker evaluation of sleep apnea, and cough in the setting of reflux.  She has a history of reflux, and is folloed by Dr. Juanda Chance.  She reports having EGD recently, and had low oxygen level during procedure.  She is also concerned about constant cough, and possibility of reflux.  She has not had recent PFT or chest xray.    She coughs 24 hours per day, and is always hoarse.  She does not bring up sputum, and denies hemoptysis.  She does get a burning in her chest.  She will occasionally get sinus congestion, but does not usually take anything for this.  She denies any history of allergies or asthma.   She worked as a Runner, broadcasting/film/video.  She was exposed to another teacher who was positive for tuberculosis in 1995.  She was given INH for 6 months.  She had pneumonia twice last year after aspirating.  She did get her pneumococcal vaccination.  She is retired.  She is from [place], and denies any travel history.  She has a Emergency planning/management officer.    She snores, and has trouble with her breathing while asleep.  She can't sleep flat.  She has been told she stops breathing while asleep, and will wake up with a choking sensation.  Her family reports what appears to be a paradoxical breathing pattern while asleep.  She goes to bed at 1030.  She falls asleep quickly, and does not use anything to help her sleep.  She wakes up once to use the bathroom.  She gets out of bed at 8 am.  She feels tired during the day.  She does not use anything to help her stay awake.  The patient denies sleep walking, sleep talking, bruxism, or nightmares.  There is no history of restless legs.  The patient denies sleep hallucinations, sleep paralysis, or cataplexy.  Her Epworth score is 7 out of 24.  Tests: Barium esophogram 07/05/12 >> Prominent, spontaneous GERD  reaching upper thoracic esophagus   [name]  has a past medical history of Anxiety; Arthritis; Cancer; Depression; GERD (gastroesophageal reflux disease); Hypertension; Hyperlipidemia; Tuberculosis (1995); Ulcer (1982); Thyroid disease; Hyperplastic colonic polyp; and IBS (irritable bowel syndrome).  [name]  has past surgical history that includes Vaginal hysterectomy (1983); mos; Mohs surgery (2006); Polypectomy; Colonoscopy; and Thyroid surgery.  Prior to Admission medications   Medication Sig Start Date End Date Taking? Authorizing Provider  atenolol (TENORMIN) 100 MG tablet Take 100 mg by mouth daily.     Yes Historical Provider, MD  Calcium Carbonate-Vitamin D (CALCIUM 600 + D PO) Take 1 tablet by mouth daily.   Yes Historical Provider, MD  clonazePAM (KLONOPIN) 0.5 MG tablet Take 1 tablet by mouth at bedtime. 07/23/11  Yes Historical Provider, MD  dexlansoprazole (DEXILANT) 60 MG capsule Take 1 capsule (60 mg total) by mouth 2 (two) times daily. 06/28/12  Yes Hart Carwin, MD  diazepam (VALIUM) 5 MG tablet Take 5 mg by mouth at bedtime as needed.     Yes Historical Provider, MD  escitalopram (LEXAPRO) 20 MG tablet Take 1 tablet by mouth Daily. 08/06/11  Yes Historical Provider, MD  Fish Oil-Cholecalciferol (FISH OIL + D3) 1200-1000 MG-UNIT CAPS Take 1 capsule by mouth daily.   Yes Historical Provider, MD  hydrochlorothiazide 25  MG tablet Take 25 mg by mouth daily.     Yes Historical Provider, MD  levothyroxine (SYNTHROID, LEVOTHROID) 200 MCG tablet Take 1 tablet by mouth Daily. 157mg  Mon-Friday then 200 mg Sat and Sunday 08/19/11  Yes Historical Provider, MD  metoCLOPramide (REGLAN) 5 MG tablet Take 1 tablet (5 mg total) by mouth at bedtime. 06/28/12  Yes Hart Carwin, MD  Multiple Vitamins-Minerals (MULTI FOR HER 50+) CAPS Take 1 capsule by mouth daily.   Yes Historical Provider, MD  pravastatin (PRAVACHOL) 40 MG tablet Take 40 mg by mouth daily.     Yes Historical Provider,  MD  ranitidine (ZANTAC) 150 MG tablet Take 150 mg by mouth 1 day or 1 dose.   Yes Historical Provider, MD  traMADol (ULTRAM) 50 MG tablet Take 1 tablet by mouth as needed. migraines 08/11/11  Yes Historical Provider, MD  zolpidem (AMBIEN) 10 MG tablet Take 1 tablet by mouth at bedtime. 08/08/11  Yes Historical Provider, MD    Allergies  Allergen Reactions  . Lipitor (Atorvastatin Calcium)     myalgia  . Pravastatin Other (See Comments)    Severe pain in feet  . Zocor (Simvastatin - High Dose)     myalgia    family history includes Lung cancer in her maternal aunt and mother. She is adopted.   reports that she quit smoking about 10 years ago. Her smoking use included Cigarettes. She smoked 0.00 packs per day for 3 years. She has never used smokeless tobacco. She reports that she does not drink alcohol or use illicit drugs.  Review of Systems  Constitutional: Positive for unexpected weight change. Negative for appetite change.  HENT: Positive for congestion, sore throat, sneezing and trouble swallowing. Negative for ear pain and dental problem.   Respiratory: Positive for cough and shortness of breath.   Cardiovascular: Positive for leg swelling. Negative for chest pain and palpitations.  Gastrointestinal: Positive for abdominal pain.  Musculoskeletal: Positive for arthralgias. Negative for joint swelling.  Skin: Negative for rash.  Neurological: Negative for headaches.  Psychiatric/Behavioral: Positive for dysphoric mood. The patient is nervous/anxious.    Physical Exam:  General - No distress ENT - No sinus tenderness, MP 3, no oral exudate, no LAN, no thyromegaly, TM clear, pupils equal/reactive Cardiac - s1s2 regular, no murmur, pulses symmetric Chest - No wheeze/rales/dullness, good air entry, normal respiratory excursion Back - No focal tenderness Abd - Soft, non-tender, no organomegaly, + bowel sounds Ext - No edema Neuro - Normal strength, cranial nerves intact Skin - No  rashes Psych - Normal mood, and behavior  Assessment/Plan:  Coralyn Helling, MD  Pulmonary/Critical Care/Sleep Pager:  [phone] 07/19/2012, 10:41 AM

## 2012-07-19 NOTE — Telephone Encounter (Signed)
Patient called and left a message with her 2 week update. States she is doing some better. States she had a "bad spell 2 days ago." Reglan seems to have helped some. Still has a bad cough. She is seeing Dr. Craige Cotta today at 11:00 AM for her respiratory issues.

## 2012-07-20 ENCOUNTER — Telehealth: Payer: Self-pay | Admitting: Pulmonary Disease

## 2012-07-20 NOTE — Telephone Encounter (Signed)
Dg Chest 2 View  07/19/2012  *RADIOLOGY REPORT*   Clinical Data: Chronic cough, sleep apnea   CHEST - 2 VIEW   Comparison: None.   Findings: The study is limited by patient's body habitus.  No acute infiltrate or pleural effusion.  No pulmonary edema.  Probable small hiatal hernia.   IMPRESSION: No active disease.  Probable small hiatal hernia.     Original Report Authenticated By: Natasha Mead, M.D.     Results d/w pt.

## 2012-07-24 ENCOUNTER — Other Ambulatory Visit: Payer: Self-pay | Admitting: Internal Medicine

## 2012-07-25 ENCOUNTER — Encounter: Payer: Self-pay | Admitting: Pulmonary Disease

## 2012-07-25 DIAGNOSIS — G4733 Obstructive sleep apnea (adult) (pediatric): Secondary | ICD-10-CM

## 2012-07-25 HISTORY — DX: Obstructive sleep apnea (adult) (pediatric): G47.33

## 2012-07-25 NOTE — Assessment & Plan Note (Addendum)
She has chronic cough which is most likely related to her reflux.  She is a former smoker, but I am less concerned about asthma or obstructive lung disease.  She is concerned about chronic aspiration.  To further assess will arrange for chest xray and pulmonary function testing.  She is to follow up with Dr. Juanda Chance about treating her reflux.  Advised her to d/w Dr. Juanda Chance about whether continued use of fish oil could be contributing to some of her GI symptoms.

## 2012-07-25 NOTE — Assessment & Plan Note (Addendum)
She reports snoring, sleep disruption, and daytime sleepiness.  She has history of HTN and depression.  I am concerned she has sleep apnea.  I have explained how sleep apnea can affect the patient's health.  Driving precautions and importance of weight loss were discussed.  Treatment options for sleep apnea were reviewed.  Will arrange for in lab sleep study to further assess.

## 2012-08-01 ENCOUNTER — Ambulatory Visit (HOSPITAL_COMMUNITY)
Admission: RE | Admit: 2012-08-01 | Discharge: 2012-08-01 | Disposition: A | Discharge status: Home / Self Care | Payer: BC Managed Care – PPO | Source: Ambulatory Visit | Attending: Pulmonary Disease | Admitting: Pulmonary Disease

## 2012-08-01 ENCOUNTER — Encounter: Payer: Self-pay | Admitting: Pulmonary Disease

## 2012-08-01 ENCOUNTER — Other Ambulatory Visit: Payer: Self-pay | Admitting: *Deleted

## 2012-08-01 ENCOUNTER — Ambulatory Visit (INDEPENDENT_AMBULATORY_CARE_PROVIDER_SITE_OTHER): Payer: BC Managed Care – PPO | Admitting: Pulmonary Disease

## 2012-08-01 VITALS — BP 118/72 | HR 69 | Temp 98.5°F | Ht 66.5 in | Wt 290.0 lb

## 2012-08-01 DIAGNOSIS — R0609 Other forms of dyspnea: Secondary | ICD-10-CM | POA: Insufficient documentation

## 2012-08-01 DIAGNOSIS — R062 Wheezing: Secondary | ICD-10-CM | POA: Insufficient documentation

## 2012-08-01 DIAGNOSIS — R059 Cough, unspecified: Secondary | ICD-10-CM | POA: Insufficient documentation

## 2012-08-01 DIAGNOSIS — R0989 Other specified symptoms and signs involving the circulatory and respiratory systems: Secondary | ICD-10-CM | POA: Insufficient documentation

## 2012-08-01 DIAGNOSIS — R0602 Shortness of breath: Secondary | ICD-10-CM | POA: Insufficient documentation

## 2012-08-01 LAB — PULMONARY FUNCTION TEST

## 2012-08-01 MED ORDER — ALBUTEROL SULFATE (5 MG/ML) 0.5% IN NEBU
2.5000 mg | INHALATION_SOLUTION | Freq: Once | RESPIRATORY_TRACT | Status: AC
  Administered 2012-08-01: 2.5 mg via RESPIRATORY_TRACT

## 2012-08-01 NOTE — Patient Instructions (Signed)
Will call with results of sleep study Follow up in 2 to 3 months

## 2012-08-01 NOTE — Assessment & Plan Note (Signed)
Likely related to sleep apnea.  Will review her sleep study later this week, and call her with results.

## 2012-08-01 NOTE — Progress Notes (Signed)
Chief Complaint  Patient presents with  . Follow-up    w/ pft. Pt cough is getting better. no wheezing, chest tx. still SOB w/ exertion. Pt will have sleep study thursday night.     History of Present Illness: [name] is a 57 y.o. female former smoker with sleep apnea, and cough in the setting of reflux.  Her cough and reflux have been doing better since she was started on reglan and stopped fish oil supplement.  She is here to review her PFT.  This was normal.  She is scheduled for her sleep study later this week.  She denies chest pain, wheeze, or sputum.   Tests: Barium esophogram 07/05/12 >> Prominent, spontaneous GERD reaching upper thoracic esophagus CXR 07/19/12 >> small hiatal hernia, otherwise normal PFT 08/01/12 >> FEV1 2.50 (87%), FEV1% 85, TLC 5.04 (92%), DLCO 76%   [name]  has a past medical history of Anxiety; Arthritis; Skin cancer; Depression; GERD (gastroesophageal reflux disease); Hypertension; Hyperlipidemia; PPD positive, treated (1995); Ulcer (1982); Thyroid disease; Hyperplastic colonic polyp; and IBS (irritable bowel syndrome).  [name]  has past surgical history that includes Vaginal hysterectomy (1983); mos; Mohs surgery (2006); Polypectomy; Colonoscopy; and Thyroid surgery.  Prior to Admission medications   Medication Sig Start Date End Date Taking? Authorizing Provider  atenolol (TENORMIN) 100 MG tablet Take 100 mg by mouth daily.     Yes Historical Provider, MD  Calcium Carbonate-Vitamin D (CALCIUM 600 + D PO) Take 1 tablet by mouth daily.   Yes Historical Provider, MD  clonazePAM (KLONOPIN) 0.5 MG tablet Take 1 tablet by mouth at bedtime. 07/23/11  Yes Historical Provider, MD  dexlansoprazole (DEXILANT) 60 MG capsule Take 1 capsule (60 mg total) by mouth 2 (two) times daily. 06/28/12  Yes Hart Carwin, MD  diazepam (VALIUM) 5 MG tablet Take 5 mg by mouth at bedtime as needed.     Yes Historical Provider, MD  escitalopram  (LEXAPRO) 20 MG tablet Take 1 tablet by mouth Daily. 08/06/11  Yes Historical Provider, MD  Fish Oil-Cholecalciferol (FISH OIL + D3) 1200-1000 MG-UNIT CAPS Take 1 capsule by mouth daily.   Yes Historical Provider, MD  hydrochlorothiazide 25 MG tablet Take 25 mg by mouth daily.     Yes Historical Provider, MD  levothyroxine (SYNTHROID, LEVOTHROID) 200 MCG tablet Take 1 tablet by mouth Daily. 157mg  Mon-Friday then 200 mg Sat and Sunday 08/19/11  Yes Historical Provider, MD  metoCLOPramide (REGLAN) 5 MG tablet Take 1 tablet (5 mg total) by mouth at bedtime. 06/28/12  Yes Hart Carwin, MD  Multiple Vitamins-Minerals (MULTI FOR HER 50+) CAPS Take 1 capsule by mouth daily.   Yes Historical Provider, MD  pravastatin (PRAVACHOL) 40 MG tablet Take 40 mg by mouth daily.     Yes Historical Provider, MD  ranitidine (ZANTAC) 150 MG tablet Take 150 mg by mouth 1 day or 1 dose.   Yes Historical Provider, MD  traMADol (ULTRAM) 50 MG tablet Take 1 tablet by mouth as needed. migraines 08/11/11  Yes Historical Provider, MD  zolpidem (AMBIEN) 10 MG tablet Take 1 tablet by mouth at bedtime. 08/08/11  Yes Historical Provider, MD    Allergies  Allergen Reactions  . Lipitor (Atorvastatin Calcium)     myalgia  . Pravastatin Other (See Comments)    Severe pain in feet  . Zocor (Simvastatin - High Dose)     myalgia    Physical Exam:  General - No distress ENT - No sinus tenderness, MP  3, no oral exudate, no LAN Cardiac - s1s2 regular, no murmur, pulses symmetric Chest - No wheeze/rales/dullness, good air entry, normal respiratory excursion Back - No focal tenderness Abd - Soft, non-tender, no organomegaly, + bowel sounds Ext - No edema Neuro - Normal strength Skin - No rashes Psych - Normal mood, and behavior  Assessment/Plan:  Coralyn Helling, MD [place] Pulmonary/Critical Care/Sleep Pager:  [phone] 08/01/2012, 4:38 PM

## 2012-08-01 NOTE — Assessment & Plan Note (Signed)
Likely related to reflux, and improved since last visit.  She is to continue dexilant and reglan per GI.  Reviewed potential side effects to monitor for while on reglan.  Her pulmonary evaluation otherwise has been negative for an alternative cause of her cough, and I don't think she needs any additional pulmonary testing at this time.

## 2012-08-03 ENCOUNTER — Ambulatory Visit (HOSPITAL_BASED_OUTPATIENT_CLINIC_OR_DEPARTMENT_OTHER): Payer: BC Managed Care – PPO | Attending: Pulmonary Disease | Admitting: Radiology

## 2012-08-03 VITALS — Ht 65.5 in | Wt 277.0 lb

## 2012-08-03 DIAGNOSIS — I10 Essential (primary) hypertension: Secondary | ICD-10-CM | POA: Insufficient documentation

## 2012-08-03 DIAGNOSIS — Z79899 Other long term (current) drug therapy: Secondary | ICD-10-CM | POA: Insufficient documentation

## 2012-08-03 DIAGNOSIS — G4733 Obstructive sleep apnea (adult) (pediatric): Secondary | ICD-10-CM | POA: Insufficient documentation

## 2012-08-04 ENCOUNTER — Telehealth: Payer: Self-pay | Admitting: Pulmonary Disease

## 2012-08-04 ENCOUNTER — Encounter: Payer: Self-pay | Admitting: Pulmonary Disease

## 2012-08-04 DIAGNOSIS — G4733 Obstructive sleep apnea (adult) (pediatric): Secondary | ICD-10-CM

## 2012-08-04 NOTE — Telephone Encounter (Signed)
PSG 08/03/12 >> AHI 108.6, SpO2 low 83%; CPAP 11 cm H2O >> AHI 4.3, +R, +S, PLMI 6 >>3.5.  Results d/w pt.  Will arrange for CPAP set up.  She has requested to use [employer] [facility].  Will have my nurse schedule ROV 8 weeks after CPAP set up.

## 2012-08-04 NOTE — Telephone Encounter (Signed)
Called and spoke with pt and she stated that she is anxious to get the results of her sleep test.  She was told that she stopped breathing while doing the test and pt is wanting to get set up with cpap asap.  VS please advise if you have seen the results yet.  Pt stated that she will be using [facility] and she has already spoken to them.  Pt stated ok to leave a message on her home phone.  VS please advise. Thanks  Allergies  Allergen Reactions  . Lipitor (Atorvastatin Calcium)     myalgia  . Pravastatin Other (See Comments)    Severe pain in feet  . Zocor (Simvastatin - High Dose)     myalgia

## 2012-08-05 NOTE — Procedures (Signed)
NAME:  [name], [name] NO.:  [id number]  MEDICAL RECORD NO.:  [id number]          PATIENT TYPE:  OUT  LOCATION:  SLEEP CENTER                 FACILITY[facility]  [facility]  PHYSICIAN:  Coralyn Helling, MD        DATE OF BIRTH:  28-Jan-1956  DATE OF STUDY:  08/04/2012                           NOCTURNAL POLYSOMNOGRAM  REFERRING PHYSICIAN:  Coralyn Helling, MD  INDICATION FOR STUDY:  [name] is a 57 year old female, who has a history of hypertension, depression.  She also reports snoring, sleep disruption, and daytime sleepiness.  She is referred to sleep lab for evaluation of hypersomnia with obstructive sleep apnea.  Height is 5 feet 5 inches, weight is 277 pounds, BMI is 45, neck size is 17.5 inches.  EPWORTH SLEEPINESS SCORE:  3.  MEDICATIONS:  Atenolol, calcium, Klonopin, Valium, Lexapro, fish oil, hydrochlorothiazide, levothyroxine, Reglan, multivitamin, Zantac, and Ultram.  SLEEP ARCHITECTURE:  The patient followed a split night study protocol. During the diagnostic portion of the study, total recording time was 167 minutes.  Total sleep time was 121 minutes.  Sleep efficiency was 70%. Sleep latency was 22 minutes.  This portion of study was notable for a lack of slow-wave sleep and REM sleep.  The patient slept in both supine and nonsupine positions.  During the titration portion of study, total recording time was 246 minutes.  Total sleep time was 237 minutes.  Sleep efficiency was 96%. Sleep latency was 1 minute.  REM latency was 74 minutes.  This portion of the study was notable for lack of slow-wave sleep and she slept exclusively in the supine position.  RESPIRATORY DATA:  The average respiratory rate was 20.  Moderate snoring was noted by the technician.  During the diagnostic portion of study, the overall apnea/hypopnea index was 108.6.  The events were exclusively obstructive in nature.  During the titration portion of the study, the patient was started  on CPAP of 5 and increased to 15 cm water.  With CPAP set at 11 cm of water, her apnea-hypopnea index was reduced to 4.3.  At this pressure setting, she was observed in REM sleep and supine sleep.  She was fitted with a medium-sized ResMed Mirage FX mask.  OXYGEN DATA:  The baseline oxygenation was 95%.  The oxygen saturation nadir was 83%.  The patient had good control of her oxygenation with CPAP at 11 cm of water.  The study was conducted without the use of supplemental oxygen.  CARDIAC DATA:  The average heart rate was 66 and the rhythm strip showed normal sinus rhythm with occasional PVCs.  MOVEMENT-PARASOMNIA:  The patient had 1 restroom trip.  The periodic limb movement index was 6 during the diagnostic portion of study and 3.5 in the therapeutic portion of study.  IMPRESSIONS-RECOMMENDATIONS:  This study shows evidence for severe obstructive sleep apnea with an apnea/hypopnea index of 108.6, an oxygen saturation nadir of 83%.  She did very well with CPAP of 11 cm of water.  In addition to diet, exercise, and weight reduction, I would recommend the patient will be started on CPAP of 11 cm water and monitored for her clinical response.     Coralyn Helling, MD  Diplomat, American Board of Sleep Medicine    VS/MEDQ  D:  08/04/2012 16:53:04  T:  08/05/2012 02:39:44  Job:  [id number]

## 2012-08-10 NOTE — Telephone Encounter (Signed)
Pt returned call.  Set an appt w/ VS for 10/06/12 @ 10 AM.    Antionette Fairy

## 2012-08-10 NOTE — Telephone Encounter (Signed)
lmomtcb x1 for pt to schedule OV for 8 weeks

## 2012-08-10 NOTE — Telephone Encounter (Signed)
Noted  

## 2012-08-16 ENCOUNTER — Ambulatory Visit (INDEPENDENT_AMBULATORY_CARE_PROVIDER_SITE_OTHER): Payer: BC Managed Care – PPO

## 2012-08-16 ENCOUNTER — Other Ambulatory Visit: Payer: Self-pay

## 2012-08-16 ENCOUNTER — Ambulatory Visit: Payer: BC Managed Care – PPO | Admitting: Pharmacist

## 2012-08-16 VITALS — Ht 65.5 in | Wt 290.0 lb

## 2012-08-16 DIAGNOSIS — E039 Hypothyroidism, unspecified: Secondary | ICD-10-CM

## 2012-08-16 LAB — HM DEXA SCAN: HM DEXA SCAN: NORMAL

## 2012-08-16 NOTE — Progress Notes (Unsigned)
Osteoporosis Clinic Current Height:        Max Lifetime Height:  5' 5.5" Current Weight:       Max Lifetime Weight:  ***  Ethnicity: Not Hispanic or Latino [1] BP:       HR:         HPI: Does pt already have a diagnosis of:  Osteopenia?  No Osteoporosis?  No  Back Pain?  No       Kyphosis?  No Prior fracture?  No Med(s) for Osteoporosis/Osteopenia:  N/A Med(s) previously tried for Osteoporosis/Osteopenia:  N/A                                                             PMH: Age at menopause:  Hysterectomy in 1986 Hysterectomy?  Yes Oophorectomy?  No HRT? No Steroid Use?  No Thyroid med?  Yes History of cancer?  Yes - skin cancer History of digestive disorders (ie Crohn's)?  Yes Current or previous eating disorders?  No Last Vitamin D Result:  75 (08/2010) Last SCr Result: 0.82 (03/2012)   FH/SH: Family history of osteoporosis?  Unknown  Parent with history of hip fracture?  unknown Family history of breast cancer?  unknown Exercise?  No Caffeine?  Yes 1 cup coffee/day Smoking?  No Alcohol?  No    Calcium Assessment Calcium Intake  # of servings/day  Calcium mg  Milk (8 oz) 1/2 - 1  x  300  = 150 - 300mg   Yogurt (8 oz) occ < daily x  400 =    Cheese (1 oz) 2x/week x  200 =    Non dairy sources   250 mg  Ca supplement MVI qd and Calcium 600mg  1d = 1000mg    Estimated calcium intake per day 1350-1550mg     DEXA Results Date of Test T-Score for AP Spine L1-L4 T-Score for Left Neck of Hip T-Score for Right Neck of Hip  *** *** *** ***                  FRAX 10 year estimate: Total FX risk:  ***  (consider medication if >/= 20%) Hip FX risk:  ***  (consider medication if >/= 3%)  Assessment: ***  Recommendations: Changes in Osteo meds:  *** Weight bearing exercise Educate on fall preventtion - counseling and educational materials provided Recheck DEXA:  ***  Time spent counseling patient:  ***

## 2012-08-17 ENCOUNTER — Telehealth: Payer: Self-pay | Admitting: Nurse Practitioner

## 2012-08-17 NOTE — Telephone Encounter (Signed)
appt made for tomorr. With mmm

## 2012-08-17 NOTE — Telephone Encounter (Signed)
Patient called to make an appt today for severe pain in the tops of both feet and in her legs but more on her right side. Patient was offered an appt 4/16 and she states that is too far out and needs to be seen sooner.

## 2012-08-18 ENCOUNTER — Encounter: Payer: Self-pay | Admitting: Nurse Practitioner

## 2012-08-18 ENCOUNTER — Ambulatory Visit (INDEPENDENT_AMBULATORY_CARE_PROVIDER_SITE_OTHER): Payer: BC Managed Care – PPO | Admitting: Nurse Practitioner

## 2012-08-18 VITALS — BP 128/67 | HR 67 | Temp 98.1°F | Ht 65.5 in | Wt 279.0 lb

## 2012-08-18 DIAGNOSIS — G5793 Unspecified mononeuropathy of bilateral lower limbs: Secondary | ICD-10-CM

## 2012-08-18 DIAGNOSIS — G5790 Unspecified mononeuropathy of unspecified lower limb: Secondary | ICD-10-CM

## 2012-08-18 MED ORDER — GABAPENTIN 300 MG PO CAPS: 300.0000 mg | ORAL_CAPSULE | Freq: Three times a day (TID) | ORAL | Status: DC

## 2012-08-18 NOTE — Patient Instructions (Signed)
Neuropathy Neuropathy means your peripheral nerves are not working normally. Peripheral nerves are the nerves outside the brain and spinal cord. Messages between the brain and the rest of the body do not work properly with peripheral nerve disorders. CAUSES There are many different causes of peripheral nerve disorders. These include:  Injury.   Infections.   Diabetes.   Vitamin deficiency.   Poor circulation.   Alcoholism.   Exposure to toxins.   Drug effects.   Tumors.   Kidney disease.  SYMPTOMS  Tingling, burning, pain, and numbness in the extremities.   Weakness and loss of muscle tone and size.  DIAGNOSIS Blood tests and special studies of nerve function may help confirm the diagnosis.  TREATMENT  Treatment includes adopting healthy life habits.   A good diet, vitamin supplements, and mild pain medicine may be needed.   Avoid known toxins such as alcohol, tobacco, and recreational drugs.   Anti-convulsant medicines are helpful in some types of neuropathy.  Make a follow-up appointment with your caregiver to be sure you are getting better with treatment.  SEEK IMMEDIATE MEDICAL CARE IF:   You have breathing problems.   You have severe or uncontrolled pain.   You notice extreme weakness or you feel faint.   You are not better after 1 week or if you have worse symptoms.  Document Released: 06/17/2004 Document Revised: 01/20/2011 Document Reviewed: 05/10/2005 Exit[employer] Patient Information 2012 [employer]. 

## 2012-08-18 NOTE — Addendum Note (Signed)
Addended by: Prescott Gum on: 08/18/2012 02:42 PM   Modules accepted: Orders

## 2012-08-18 NOTE — Progress Notes (Signed)
  Subjective:    Patient ID: [name], female    DOB: 06/30/1955, 57 y.o.   MRN: [id number]  HPI Patient in complaining of pain Pain tops of bil feet. Started . persistent. Rates pain 8/10, worse at night. Nothing  helps pain. walking increases pain. Describes pain as burning a prickly. Denies numbness.     Review of Systems  Constitutional: Negative.   Respiratory: Negative.   Cardiovascular: Negative.   Musculoskeletal: Negative.        Objective:   Physical Exam  Constitutional: She is oriented to person, place, and time. She appears well-developed and well-nourished.  Cardiovascular: Normal rate, normal heart sounds and intact distal pulses.   Pulmonary/Chest: Effort normal and breath sounds normal.  Neurological: She is alert and oriented to person, place, and time. She displays normal reflexes.  + 4/4 monofilament bil  Skin: Skin is warm and dry.   BP 128/67  Pulse 67  Temp(Src) 98.1 F (36.7 C) (Oral)  Ht 5' 5.5" (1.664 m)  Wt 279 lb (126.554 kg)  BMI 45.71 kg/m2        Assessment & Plan:  1. Neuropathy of both feet - gabapentin (NEURONTIN) 300 MG capsule; Take 1 capsule (300 mg total) by mouth 3 (three) times daily.  Dispense: 90 capsule; Refill: 3 - Anemia panel 7 - Vitamin B12  Mary-Margaret Daphine Deutscher, FNP

## 2012-08-22 ENCOUNTER — Telehealth: Payer: Self-pay | Admitting: Internal Medicine

## 2012-08-22 MED ORDER — DEXLANSOPRAZOLE 60 MG PO CPDR: 60.0000 mg | DELAYED_RELEASE_CAPSULE | Freq: Two times a day (BID) | ORAL | Status: DC

## 2012-08-22 NOTE — Telephone Encounter (Signed)
90 day supply of Dexilant sent to patient's pharmacy. Patient verbalizes understanding.

## 2012-09-01 ENCOUNTER — Ambulatory Visit: Payer: BC Managed Care – PPO

## 2012-10-06 ENCOUNTER — Ambulatory Visit (INDEPENDENT_AMBULATORY_CARE_PROVIDER_SITE_OTHER): Payer: BC Managed Care – PPO | Admitting: Pulmonary Disease

## 2012-10-06 ENCOUNTER — Encounter: Payer: Self-pay | Admitting: Pulmonary Disease

## 2012-10-06 VITALS — BP 138/80 | HR 67 | Temp 98.4°F | Ht 65.5 in | Wt 267.0 lb

## 2012-10-06 DIAGNOSIS — G4733 Obstructive sleep apnea (adult) (pediatric): Secondary | ICD-10-CM

## 2012-10-06 DIAGNOSIS — R05 Cough: Secondary | ICD-10-CM

## 2012-10-06 NOTE — Assessment & Plan Note (Signed)
Likely related to reflux, and improved since last visit.  She is to continue dexilant per GI.

## 2012-10-06 NOTE — Assessment & Plan Note (Signed)
She has severe OSA.  I have reviewed the recent sleep study results with the patient.  We discussed how sleep apnea can affect various health problems including risks for hypertension, cardiovascular disease, and diabetes.  We also discussed how sleep disruption can increase risks for accident, such as while driving.  Weight loss as a means of improving sleep apnea was also reviewed.  Additional treatment options discussed were CPAP therapy, oral appliance, and surgical intervention.  She is doing very well with CPAP 11 cm H2O.  She is compliant with therapy, and reports benefit.

## 2012-10-06 NOTE — Progress Notes (Signed)
Chief Complaint  Patient presents with  . 8 wk follow up    Doing well on cpap overall.  Feels cpap has really helped.  Wearing it everynight for approx 10 hours.  Would like to see about pressure increased a little.  No problems with mask.  Breathing doing well.  Does have some SOB with activities but states this is no problem.  No wheezing, chest tightness, chest pain, or cough.    History of Present Illness: [name] is a 57 y.o. female former smoker with sleep apnea, and cough in the setting of reflux.  Since her last visit she had sleep study which showed severe OSA.  She was started on CPAP 11 cm H2O.  She has full face mask.  She goes to bed at 10 pm and wakes up at 8 am.  She uses her CPAP all night.  She is sleeping better, and feels much better during the day.  She is no longer snoring.  She is no longer having a cough.  She continues to use dexilant.  She is no longer using reglan.  Tests: Barium esophogram 07/05/12 >> Prominent, spontaneous GERD reaching upper thoracic esophagus CXR 07/19/12 >> small hiatal hernia, otherwise normal PFT 08/01/12 >> FEV1 2.50 (87%), FEV1% 85, TLC 5.04 (92%), DLCO 76% PSG 08/03/12 >> AHI 108.6, SpO2 low 83%; CPAP 11 cm H2O >> AHI 4.3, +R, +S, PLMI 6 >>3.5.   [name]  has a past medical history of Anxiety; Arthritis; Skin cancer; Depression; GERD (gastroesophageal reflux disease); Hypertension; Hyperlipidemia; PPD positive, treated (1995); Ulcer (1982); Thyroid disease; Hyperplastic colonic polyp; IBS (irritable bowel syndrome); and OSA (obstructive sleep apnea) (07/25/2012).  [name]  has past surgical history that includes Vaginal hysterectomy (1983); mos; Mohs surgery (2006); Polypectomy; Colonoscopy; and Thyroid surgery.  Prior to Admission medications   Medication Sig Start Date End Date Taking? Authorizing Provider  atenolol (TENORMIN) 100 MG tablet Take 100 mg by mouth daily.     Yes Historical Provider, MD  Calcium  Carbonate-Vitamin D (CALCIUM 600 + D PO) Take 1 tablet by mouth daily.   Yes Historical Provider, MD  clonazePAM (KLONOPIN) 0.5 MG tablet Take 1 tablet by mouth at bedtime. 07/23/11  Yes Historical Provider, MD  dexlansoprazole (DEXILANT) 60 MG capsule Take 1 capsule (60 mg total) by mouth 2 (two) times daily. 06/28/12  Yes Hart Carwin, MD  diazepam (VALIUM) 5 MG tablet Take 5 mg by mouth at bedtime as needed.     Yes Historical Provider, MD  escitalopram (LEXAPRO) 20 MG tablet Take 1 tablet by mouth Daily. 08/06/11  Yes Historical Provider, MD  Fish Oil-Cholecalciferol (FISH OIL + D3) 1200-1000 MG-UNIT CAPS Take 1 capsule by mouth daily.   Yes Historical Provider, MD  hydrochlorothiazide 25 MG tablet Take 25 mg by mouth daily.     Yes Historical Provider, MD  levothyroxine (SYNTHROID, LEVOTHROID) 200 MCG tablet Take 1 tablet by mouth Daily. 157mg  Mon-Friday then 200 mg Sat and Sunday 08/19/11  Yes Historical Provider, MD  metoCLOPramide (REGLAN) 5 MG tablet Take 1 tablet (5 mg total) by mouth at bedtime. 06/28/12  Yes Hart Carwin, MD  Multiple Vitamins-Minerals (MULTI FOR HER 50+) CAPS Take 1 capsule by mouth daily.   Yes Historical Provider, MD  pravastatin (PRAVACHOL) 40 MG tablet Take 40 mg by mouth daily.     Yes Historical Provider, MD  ranitidine (ZANTAC) 150 MG tablet Take 150 mg by mouth 1 day or 1 dose.  Yes Historical Provider, MD  traMADol (ULTRAM) 50 MG tablet Take 1 tablet by mouth as needed. migraines 08/11/11  Yes Historical Provider, MD  zolpidem (AMBIEN) 10 MG tablet Take 1 tablet by mouth at bedtime. 08/08/11  Yes Historical Provider, MD    Allergies  Allergen Reactions  . Lipitor (Atorvastatin Calcium)     myalgia  . Pravastatin Other (See Comments)    Severe pain in feet  . Zocor (Simvastatin - High Dose)     myalgia    Physical Exam:  General - No distress ENT - No sinus tenderness, MP 3, no oral exudate, no LAN Cardiac - s1s2 regular, no murmur Chest - No  wheeze/rales/dullness Back - No focal tenderness Abd - Soft, non-tender Ext - No edema Neuro - Normal strength Skin - No rashes Psych - Normal mood, and behavior  Assessment/Plan:  Olivia Helling, MD [facility] Pager:  [phone] 10/06/2012, 10:02 AM

## 2012-10-06 NOTE — Patient Instructions (Signed)
Follow up in 1 year Call if help needed sooner 

## 2012-10-23 ENCOUNTER — Other Ambulatory Visit: Payer: Self-pay | Admitting: Nurse Practitioner

## 2012-10-25 ENCOUNTER — Telehealth: Payer: Self-pay | Admitting: Nurse Practitioner

## 2012-10-25 NOTE — Telephone Encounter (Signed)
done

## 2012-11-04 ENCOUNTER — Other Ambulatory Visit: Payer: Self-pay | Admitting: Nurse Practitioner

## 2012-11-16 ENCOUNTER — Telehealth: Payer: Self-pay | Admitting: Nurse Practitioner

## 2012-11-16 MED ORDER — SUMATRIPTAN 20 MG/ACT NA SOLN: 1.0000 | NASAL | Status: DC | PRN

## 2012-11-16 NOTE — Telephone Encounter (Signed)
Patient aware that sent to pharamcy

## 2012-11-16 NOTE — Telephone Encounter (Signed)
Please review

## 2012-11-21 ENCOUNTER — Telehealth: Payer: Self-pay | Admitting: Nurse Practitioner

## 2012-11-21 ENCOUNTER — Ambulatory Visit (INDEPENDENT_AMBULATORY_CARE_PROVIDER_SITE_OTHER): Payer: BC Managed Care – PPO | Admitting: Family Medicine

## 2012-11-21 ENCOUNTER — Ambulatory Visit (INDEPENDENT_AMBULATORY_CARE_PROVIDER_SITE_OTHER): Payer: BC Managed Care – PPO

## 2012-11-21 ENCOUNTER — Encounter: Payer: Self-pay | Admitting: Family Medicine

## 2012-11-21 VITALS — BP 147/83 | HR 81 | Temp 101.3°F | Wt 267.0 lb

## 2012-11-21 DIAGNOSIS — R05 Cough: Secondary | ICD-10-CM

## 2012-11-21 DIAGNOSIS — J209 Acute bronchitis, unspecified: Secondary | ICD-10-CM

## 2012-11-21 DIAGNOSIS — R062 Wheezing: Secondary | ICD-10-CM

## 2012-11-21 MED ORDER — ALBUTEROL SULFATE HFA 108 (90 BASE) MCG/ACT IN AERS: 2.0000 | INHALATION_SPRAY | RESPIRATORY_TRACT | Status: DC | PRN

## 2012-11-21 MED ORDER — CEFTRIAXONE SODIUM 1 G IJ SOLR
1.0000 g | Freq: Once | INTRAMUSCULAR | Status: AC
  Administered 2012-11-21: 1 g via INTRAMUSCULAR

## 2012-11-21 MED ORDER — PREDNISONE 50 MG PO TABS: ORAL_TABLET | ORAL | Status: DC

## 2012-11-21 MED ORDER — AZITHROMYCIN 250 MG PO TABS: ORAL_TABLET | ORAL | Status: DC

## 2012-11-21 MED ORDER — METHYLPREDNISOLONE SODIUM SUCC 125 MG IJ SOLR
125.0000 mg | Freq: Once | INTRAMUSCULAR | Status: AC
  Administered 2012-11-21: 125 mg via INTRAMUSCULAR

## 2012-11-21 NOTE — Progress Notes (Signed)
Tolerated solu medrol injection well Tolerated rocephin injection well

## 2012-11-21 NOTE — Patient Instructions (Addendum)
Ceftriaxone injection What is this medicine? CEFTRIAXONE (sef try AX one) is a cephalosporin antibiotic. It is used to treat certain kinds of bacterial infections. It It will not work for colds, flu, or other viral infections. This medicine may be used for other purposes; ask your health care provider or pharmacist if you have questions. What should I tell my health care provider before I take this medicine? They need to know if you have any of these conditions: -any chronic illness -bowel disease, like colitis -both kidney and liver disease -high bilirubin level in newborn patients -an unusual or allergic reaction to ceftriaxone, other cephalosporin or penicillin antibiotics, foods, dyes or preservatives -pregnant or trying to get pregnant -breast-feeding How should I use this medicine? This medicine is injected into a muscle or infused it into a vein. It is usually given in a medical office or clinic. If you are to give this medicine you will be taught how to inject it. Follow instructions carefully. Use your doses at regular intervals. Do not take your medicine more often than directed. Do not skip doses or stop your medicine early even if you feel better. Do not stop taking except on your doctor's advice. Talk to your pediatrician regarding the use of this medicine in children. Special care may be needed. Overdosage: If you think you have taken too much of this medicine contact a poison control center or emergency room at once. NOTE: This medicine is only for you. Do not share this medicine with others. What if I miss a dose? If you miss a dose, take it as soon as you can. If it is almost time for your next dose, take only that dose. Do not take double or extra doses. What may interact with this medicine? Do not take this medicine with any of the following medications: -calcium This medicine may also interact with the following medications: -alcohol -some other  antibiotics -warfarin This list may not describe all possible interactions. Give your health care provider a list of all the medicines, herbs, non-prescription drugs, or dietary supplements you use. Also tell them if you smoke, drink alcohol, or use illegal drugs. Some items may interact with your medicine. What should I watch for while using this medicine? Tell your doctor or health care professional if your symptoms do not improve or if they get worse. Do not treat diarrhea with over the counter products. Contact your doctor if you have diarrhea that lasts more than 2 days or if it is severe and watery. If you are being treated for a sexually transmitted disease, avoid sexual contact until you have finished your treatment. Having sex can infect your sexual partner. Calcium may bind to this medicine and cause lung or kidney problems. Avoid calcium products while taking this medicine and for 48 hours after taking the last dose of this medicine. What side effects may I notice from receiving this medicine? Side effects that you should report to your doctor or health care professional as soon as possible: -allergic reactions like skin rash, itching or hives, swelling of the face, lips, or tongue -breathing problems -fever, chills -irregular heartbeat -pain when passing urine -seizures -stomach pain, cramps -unusual bleeding, bruising -unusually weak or tired Side effects that usually do not require medical attention (report to your doctor or health care professional if they continue or are bothersome): -diarrhea -dizzy, drowsy -headache -nausea, vomiting -pain, swelling, irritation where injected -stomach upset -sweating This list may not describe all possible side effects. Call your  doctor for medical advice about side effects. You may report side effects to FDA at 1-800-FDA-1088. Where should I keep my medicine? Keep out of the reach of children. Store at room temperature below 25  degrees C (77 degrees F). Protect from light. Throw away any unused vials after the expiration date. NOTE: This sheet is a summary. It may not cover all possible information. If you have questions about this medicine, talk to your doctor, pharmacist, or health care provider.  2013, [employer]vier/Gold Standard. (08/15/2007 1:34:22 PM) Methylprednisolone Solution for Injection What is this medicine? METHYLPREDNISOLONE (meth ill pred NISS oh lone) is a corticosteroid. It is commonly used to treat inflammation of the skin, joints, lungs, and other organs. Common conditions treated include asthma, allergies, and arthritis. It is also used for other conditions, such as blood disorders and diseases of the adrenal glands. This medicine may be used for other purposes; ask your health care provider or pharmacist if you have questions. What should I tell my health care provider before I take this medicine? They need to know if you have any of these conditions: -cataracts or glaucoma -Cushing's syndrome -heart disease -high blood pressure -infection including tuberculosis -low calcium or potassium levels in the blood -recent surgery -seizures -stomach or intestinal disease, including colitis -threadworms -thyroid problems -an unusual or allergic reaction to methylprednisolone, corticosteroids, benzyl alcohol, other medicines, foods, dyes, or preservatives -pregnant or trying to get pregnant -breast-feeding How should I use this medicine? This medicine is for injection or infusion into a vein. It is also for injection into a muscle. It is given by a health care professional in a hospital or clinic setting. Talk to your pediatrician regarding the use of this medicine in children. While this drug may be prescribed for selected conditions, precautions do apply. Overdosage: If you think you have taken too much of this medicine contact a poison control center or emergency room at once. NOTE: This medicine is  only for you. Do not share this medicine with others. What if I miss a dose? This does not apply. What may interact with this medicine? Do not take this medicine with any of the following medications: -mifepristone -radiopaque contrast agents This medicine may also interact with the following medications: -aspirin and aspirin-like medicines -cyclosporin -ketoconazole -phenobarbital -phenytoin -rifampin -tacrolimus -troleandomycin -vaccines -warfarin This list may not describe all possible interactions. Give your health care provider a list of all the medicines, herbs, non-prescription drugs, or dietary supplements you use. Also tell them if you smoke, drink alcohol, or use illegal drugs. Some items may interact with your medicine. What should I watch for while using this medicine? Visit your doctor or health care professional for regular checks on your progress. If you are taking this medicine for a long time, carry an identification card with your name and address, the type and dose of your medicine, and your doctor's name and address. The medicine may increase your risk of getting an infection. Stay away from people who are sick. Tell your doctor or health care professional if you are around anyone with measles or chickenpox. You may need to avoid some vaccines. Talk to your health care provider for more information. If you are going to have surgery, tell your doctor or health care professional that you have taken this medicine within the last twelve months. Ask your doctor or health care professional about your diet. You may need to lower the amount of salt you eat. The medicine can increase your blood sugar.  If you are a diabetic check with your doctor if you need help adjusting the dose of your diabetic medicine. What side effects may I notice from receiving this medicine? Side effects that you should report to your doctor or health care professional as soon as possible: -allergic  reactions like skin rash, itching or hives, swelling of the face, lips, or tongue -bloody or tarry stools -changes in vision -eye pain or bulging eyes -fever, sore throat, sneezing, cough, or other signs of infection, wounds that will not heal -increased thirst -irregular heartbeat -muscle cramps -pain in hips, back, ribs, arms, shoulders, or legs -swelling of the ankles, feet, hands -trouble passing urine or change in the amount of urine -unusual bleeding or bruising -unusually weak or tired -weight gain or weight loss Side effects that usually do not require medical attention (report to your doctor or health care professional if they continue or are bothersome): -changes in emotions or moods -constipation or diarrhea -headache -irritation at site where injected -nausea, vomiting -skin problems, acne, thin and shiny skin -trouble sleeping -unusual hair growth on the face or body This list may not describe all possible side effects. Call your doctor for medical advice about side effects. You may report side effects to FDA at 1-800-FDA-1088. Where should I keep my medicine? This drug is given in a hospital or clinic and will not be stored at home. NOTE: This sheet is a summary. It may not cover all possible information. If you have questions about this medicine, talk to your doctor, pharmacist, or health care provider.  2013, Elsevier/Gold Standard. (11/27/2007 1:56:07 PM)

## 2012-11-21 NOTE — Progress Notes (Signed)
  Subjective:    Patient ID: [name], female    DOB: 02/15/1956, 57 y.o.   MRN: [id number]  HPI URI Symptoms Onset: 3-4 days  Description: cough, SOB, wheezing, fever, rhinorrhea, sore throat  Modifying factors:  Morbidly obese, on CPAP, multiple sick contacts   Symptoms Nasal discharge: yes Fever: yes Sore throat: yes Cough: yes Wheezing: yes Ear pain: no GI symptoms: no Sick contacts: yes  Red Flags  Stiff neck: no Dyspnea: yes Rash: no Swallowing difficulty: no  Sinusitis Risk Factors Headache/face pain: no Double sickening: no tooth pain: no  Allergy Risk Factors Sneezing: no Itchy scratchy throat: no Seasonal symptoms: no  Flu Risk Factors Headache: no muscle aches: no severe fatigue: no     Review of Systems  All other systems reviewed and are negative.       Objective:   Physical Exam  Constitutional:  Morbidly obese    HENT:  Head: Normocephalic and atraumatic.  Right Ear: External ear normal.  Left Ear: External ear normal.  +nasal erythema, rhinorrhea bilaterally, + post oropharyngeal erythema    Eyes: Pupils are equal, round, and reactive to light.  Cardiovascular: Normal rate.   Pulmonary/Chest: No respiratory distress.  Diffuse exp wheezes Faint rales    Abdominal: Soft.  Musculoskeletal: Normal range of motion.  Neurological: She is alert.  Skin: Skin is warm.   [facility] reading (PRIMARY) by  Dr. Alvester Morin  CXR preliminary read with some changes concerning for PNA in RML/RLL vs bronchitis                                         Assessment & Plan:  Cough - Plan: DG Chest 2 View  Acute bronchitis - Plan: cefTRIAXone (ROCEPHIN) injection 1 g, methylPREDNISolone sodium succinate (SOLU-MEDROL) 125 mg/2 mL injection 125 mg, azithromycin (ZITHROMAX) 250 MG tablet, predniSONE (DELTASONE) 50 MG tablet, albuterol (PROVENTIL HFA;VENTOLIN HFA) 108 (90 BASE) MCG/ACT inhaler  Wheezing - Plan: methylPREDNISolone sodium succinate  (SOLU-MEDROL) 125 mg/2 mL injection 125 mg, predniSONE (DELTASONE) 50 MG tablet  Some changes on CXR concerning for bronchitis vs. CAP.  Noted wheezing on exam Solumedrol 125mg  IM x1 Rocephin 1gm IM x1 Zpak, prednisone, albuterol for treatment.  Discussed infectious and resp red flags at length with pt and husband.  Plan for reevaluation in 1-2 days if not improved.  Follow up as needed.     The patient and/or caregiver has been counseled thoroughly with regard to treatment plan and/or medications prescribed including dosage, schedule, interactions, rationale for use, and possible side effects and they verbalize understanding. Diagnoses and expected course of recovery discussed and will return if not improved as expected or if the condition worsens. Patient and/or caregiver verbalized understanding.

## 2012-11-21 NOTE — Telephone Encounter (Signed)
appt made

## 2012-11-29 ENCOUNTER — Other Ambulatory Visit: Payer: Self-pay | Admitting: Obstetrics and Gynecology

## 2012-11-30 NOTE — Telephone Encounter (Addendum)
eScribe request for refill on ZOLPIDEM Last filled - 05/31/12, #30 x 5 Last AEX - 05/31/12 with Dr. Tresa Res Next AEX - 06/15/13 with Dr. Farrel Gobble Please advise refills.  Chart on your desk.

## 2012-12-01 NOTE — Telephone Encounter (Signed)
RX faxed on 11/30/12

## 2012-12-12 ENCOUNTER — Ambulatory Visit (INDEPENDENT_AMBULATORY_CARE_PROVIDER_SITE_OTHER): Payer: BC Managed Care – PPO | Admitting: Family Medicine

## 2012-12-12 ENCOUNTER — Encounter: Payer: Self-pay | Admitting: Family Medicine

## 2012-12-12 VITALS — BP 130/73 | HR 65 | Temp 99.0°F | Ht 65.5 in | Wt 267.0 lb

## 2012-12-12 DIAGNOSIS — H60399 Other infective otitis externa, unspecified ear: Secondary | ICD-10-CM

## 2012-12-12 DIAGNOSIS — J309 Allergic rhinitis, unspecified: Secondary | ICD-10-CM

## 2012-12-12 DIAGNOSIS — E039 Hypothyroidism, unspecified: Secondary | ICD-10-CM

## 2012-12-12 DIAGNOSIS — H6092 Unspecified otitis externa, left ear: Secondary | ICD-10-CM

## 2012-12-12 MED ORDER — FLUTICASONE PROPIONATE 50 MCG/ACT NA SUSP: 2.0000 | Freq: Every day | NASAL | Status: DC

## 2012-12-12 MED ORDER — CETIRIZINE HCL 10 MG PO CAPS: 10.0000 mg | ORAL_CAPSULE | Freq: Every day | ORAL | Status: DC

## 2012-12-12 MED ORDER — NEOMYCIN-POLYMYXIN-HC 3.5-10000-1 OT SOLN: 3.0000 [drp] | Freq: Four times a day (QID) | OTIC | Status: DC

## 2012-12-12 NOTE — Progress Notes (Signed)
  Subjective:    Patient ID: [name], female    DOB: [birth year], 57 y.o.   MRN: [id number]  HPI EAR DISCOMFORT/FULLNESS: Location:  L ear Description: L ear fullness, also with nasal congestion, Onset:  4-5 days  Modifying factors: Was seen for bronchitis 2-3 weeks ago. Completed course of prednisone, zpak. Was transitioned to avelox and advair by ER/UC. This is now resolved. Now with current sxs.   Symptoms  Sensation of fullness: yes Ear discharge: no URI symptoms: mild-nasal congestion   Fever: no Tinnitus:no   Dizziness:no   Hearing loss:no   Toothache: no Rashes or lesions: no Facial muscle weakness: no  Red Flags Recent trauma: no PMH prior ear surgery:  n Diabetes or Immunosuppresion: no      Review of Systems  All other systems reviewed and are negative.       Objective:   Physical Exam  Constitutional: She appears well-developed.  HENT:  Head: Normocephalic and atraumatic.  Right Ear: External ear normal.  L ear canaal erythema. Minimal TTP  +nasal erythema, rhinorrhea bilaterally, + post oropharyngeal erythema    Eyes: Conjunctivae are normal. Pupils are equal, round, and reactive to light.  Neck: Normal range of motion. Neck supple.  Cardiovascular: Normal rate and regular rhythm.   Pulmonary/Chest: Effort normal and breath sounds normal.  Musculoskeletal: Normal range of motion.  Lymphadenopathy:    She has no cervical adenopathy.  Neurological: She is alert.  Skin: Skin is warm.          Assessment & Plan:  Hypothyroid - Plan: TSH  Allergic rhinitis - Plan: fluticasone (FLONASE) 50 MCG/ACT nasal spray, Cetirizine HCl 10 MG CAPS  OE (otitis externa), left - Plan: neomycin-polymyxin-hydrocortisone (CORTISPORIN) otic solution   Will place on course of flonase and zyrtec.  Start cortisporin for OE.  Discussed ENT red flags.  Follow up with ENT if sxs not improved despite treatment.

## 2012-12-15 ENCOUNTER — Other Ambulatory Visit: Payer: Self-pay | Admitting: *Deleted

## 2012-12-15 MED ORDER — ATENOLOL 100 MG PO TABS: ORAL_TABLET | ORAL | Status: DC

## 2012-12-22 ENCOUNTER — Other Ambulatory Visit: Payer: Self-pay | Admitting: Obstetrics and Gynecology

## 2012-12-22 MED ORDER — CLONAZEPAM 0.5 MG PO TABS: ORAL_TABLET | ORAL | Status: DC

## 2012-12-22 NOTE — Telephone Encounter (Signed)
RX faxed

## 2012-12-22 NOTE — Addendum Note (Signed)
Addended by: Luisa Dago on: 12/22/2012 05:23 PM   Modules accepted: Orders

## 2012-12-22 NOTE — Telephone Encounter (Signed)
eScribe request for refill on CLONAZEPAM Last filled - 05/31/12, #30 X 5 Last AEX - 05/31/12 Next AEX - 06/15/13 Please advise refills.  Paper chart on your desk.

## 2012-12-25 ENCOUNTER — Other Ambulatory Visit: Payer: Self-pay | Admitting: Nurse Practitioner

## 2013-02-08 ENCOUNTER — Other Ambulatory Visit: Payer: Self-pay | Admitting: Internal Medicine

## 2013-03-06 ENCOUNTER — Ambulatory Visit (INDEPENDENT_AMBULATORY_CARE_PROVIDER_SITE_OTHER): Payer: BC Managed Care – PPO

## 2013-03-06 DIAGNOSIS — Z23 Encounter for immunization: Secondary | ICD-10-CM

## 2013-03-16 ENCOUNTER — Other Ambulatory Visit: Payer: Self-pay | Admitting: Nurse Practitioner

## 2013-03-19 NOTE — Telephone Encounter (Signed)
Last seen 04/28/12  [facility]   Last lipid 04/18/12

## 2013-03-19 NOTE — Telephone Encounter (Signed)
[facility] for refills 

## 2013-03-20 ENCOUNTER — Other Ambulatory Visit: Payer: Self-pay | Admitting: *Deleted

## 2013-03-20 ENCOUNTER — Other Ambulatory Visit: Payer: Self-pay | Admitting: Nurse Practitioner

## 2013-03-20 MED ORDER — DIAZEPAM 5 MG PO TABS: 5.0000 mg | ORAL_TABLET | Freq: Every evening | ORAL | Status: DC | PRN

## 2013-03-20 NOTE — Telephone Encounter (Signed)
Last filled 08/14/12, last seen 12/12/12, route to pool if approved, call into [facility]

## 2013-03-20 NOTE — Telephone Encounter (Signed)
Please call in valium rx 

## 2013-03-21 NOTE — Telephone Encounter (Signed)
Called into [facility] [place] 

## 2013-03-22 NOTE — Telephone Encounter (Signed)
Last seen 12/12/12  Dr Alvester Morin   If approved route to nurse to call into [facility]

## 2013-05-23 ENCOUNTER — Other Ambulatory Visit: Payer: Self-pay | Admitting: Gynecology

## 2013-05-24 DIAGNOSIS — M1990 Unspecified osteoarthritis, unspecified site: Secondary | ICD-10-CM

## 2013-05-24 HISTORY — DX: Unspecified osteoarthritis, unspecified site: M19.90

## 2013-05-25 MED ORDER — ESCITALOPRAM OXALATE 20 MG PO TABS: 20.0000 mg | ORAL_TABLET | Freq: Every day | ORAL | Status: DC

## 2013-05-25 MED ORDER — CLONAZEPAM 0.5 MG PO TABS: ORAL_TABLET | ORAL | Status: DC

## 2013-05-25 NOTE — Telephone Encounter (Signed)
Ambien RX printed; faxed to [facility] in [place]

## 2013-05-25 NOTE — Telephone Encounter (Signed)
Last AEX: 05/31/12 with Dr. Joan Flores AEX 06/15/13 with Dr.Lathrop  S/W patient she says that Dr.Romine gave her enough refills to last her until she sees Dr.Lathrop but she has run out and is needing a month supply of Lexapro,Clonazepam, and Ambien to last her to her appointment.  Given Dr.Lathrop is out okay to refill 1 month for all three?  Please advise.

## 2013-05-25 NOTE — Telephone Encounter (Signed)
LM on patient's VM that rx's had been sent.  Thanks!

## 2013-06-05 ENCOUNTER — Other Ambulatory Visit: Payer: Self-pay | Admitting: Nurse Practitioner

## 2013-06-07 NOTE — Telephone Encounter (Signed)
Please call in valium rx

## 2013-06-07 NOTE — Telephone Encounter (Signed)
Last seen 12/12/12, last filled 03/20/13. Pt uses [facility]

## 2013-06-08 NOTE — Telephone Encounter (Signed)
Refill authorization left on [facility] voicemail.

## 2013-06-09 ENCOUNTER — Other Ambulatory Visit: Payer: Self-pay | Admitting: Nurse Practitioner

## 2013-06-09 ENCOUNTER — Telehealth: Payer: Self-pay | Admitting: Nurse Practitioner

## 2013-06-09 NOTE — Telephone Encounter (Signed)
Patient requesting Diazepam refills.  She has been trying since last week to get this.

## 2013-06-11 NOTE — Telephone Encounter (Signed)
Was done 06/07/13

## 2013-06-14 ENCOUNTER — Encounter: Payer: Self-pay | Admitting: Obstetrics and Gynecology

## 2013-06-15 ENCOUNTER — Other Ambulatory Visit: Payer: Self-pay | Admitting: Nurse Practitioner

## 2013-06-15 ENCOUNTER — Ambulatory Visit (INDEPENDENT_AMBULATORY_CARE_PROVIDER_SITE_OTHER): Payer: BC Managed Care – PPO | Admitting: Gynecology

## 2013-06-15 ENCOUNTER — Encounter: Payer: Self-pay | Admitting: Gynecology

## 2013-06-15 ENCOUNTER — Ambulatory Visit: Payer: Self-pay | Admitting: Obstetrics and Gynecology

## 2013-06-15 VITALS — BP 120/80 | HR 72 | Temp 98.1°F | Resp 16 | Ht 65.25 in | Wt 279.0 lb

## 2013-06-15 DIAGNOSIS — F411 Generalized anxiety disorder: Secondary | ICD-10-CM

## 2013-06-15 DIAGNOSIS — Z01419 Encounter for gynecological examination (general) (routine) without abnormal findings: Secondary | ICD-10-CM

## 2013-06-15 DIAGNOSIS — Z0000 Encounter for general adult medical examination without abnormal findings: Secondary | ICD-10-CM

## 2013-06-15 DIAGNOSIS — R300 Dysuria: Secondary | ICD-10-CM

## 2013-06-15 LAB — POCT URINALYSIS DIPSTICK
BILIRUBIN UA: NEGATIVE
Blood, UA: NEGATIVE
Glucose, UA: NEGATIVE
KETONES UA: NEGATIVE
Nitrite, UA: NEGATIVE
PH UA: 5
Protein, UA: NEGATIVE
Urobilinogen, UA: NEGATIVE

## 2013-06-15 MED ORDER — ESCITALOPRAM OXALATE 20 MG PO TABS: 20.0000 mg | ORAL_TABLET | Freq: Every day | ORAL | Status: DC

## 2013-06-15 MED ORDER — CLONAZEPAM 0.5 MG PO TABS: ORAL_TABLET | ORAL | Status: DC

## 2013-06-15 MED ORDER — ZOLPIDEM TARTRATE 10 MG PO TABS: ORAL_TABLET | ORAL | Status: DC

## 2013-06-15 NOTE — Progress Notes (Signed)
58 y.o. singleCaucasian female   [id number] here for annual exam. Pt had hysterectomy.  She occ report hot flashes, occ have night sweats, does have vaginal dryness.  She is using lubricants, gel.  She does not report post-menopasual bleeding.  Pt reports dizziness that began this am but denies any sinus/URTI symptoms.  Pt states occurred with change of head position.  Pt had ear infection and was noted to have fluid behind eardrum.  Pt takes ambien nightly, clonazepam daily 3-4p, and lexapro daily.  Pt is dealing with loss of daughter and parents in short period of time.  She does speak with her minister about it at times.  Patient's last menstrual period was 05/24/1986.          Sexually active: yes  The current method of family planning is status post hysterectomy.    Exercising: yes  some street & rec center walking Last pap:03-22-08 neg Abnormal PAP: neg Mammogram:12/14 BSE: done monthly Colonoscopy: 2014 DEXA: 2014 PCP manages Alcohol: none Tobacco:none  Health Maintenance  Topic Date Due  . Pap Smear  04/06/2013  . Influenza Vaccine  12/22/2013  . Mammogram  05/24/2015  . Colonoscopy  09/07/2021  . Tetanus/tdap  02/21/2022    Family History  Problem Relation Age of Onset  . Adopted: Yes  . Lung cancer Mother   . Lung cancer Maternal Aunt   . Stroke Maternal Grandfather   . Diabetes Maternal Grandfather     Patient Active Problem List   Diagnosis Date Noted  . OSA (obstructive sleep apnea) 07/25/2012  . Chronic cough 07/25/2012  . Hypertension 09/18/2010  . Hyperlipidemia 09/18/2010  . Metabolic syndrome [id number]  . Hyperthyroidism 09/18/2010  . GERD 08/04/2007  . IBS 08/04/2007  . COLONIC POLYPS 07/04/2006  . DIVERTICULOSIS, COLON 07/04/2006    Past Medical History  Diagnosis Date  . Anxiety   . Arthritis   . Skin cancer     basal cell and 1 squamous cell on face and neck  . Depression   . GERD (gastroesophageal reflux disease)   . Hypertension   .  Hyperlipidemia   . PPD positive, treated 1995    6 months INH  . Ulcer 1982    gastric  . Thyroid disease     hypothyroid/ radio active iodine  . Hyperplastic colonic polyp   . IBS (irritable bowel syndrome)   . OSA (obstructive sleep apnea) 07/25/2012  . Migraines   . Hiatal hernia   . Exposure to TB     Treated x 6 months  . Basal cell carcinoma of face 2008    Left    Past Surgical History  Procedure Laterality Date  . Mos    . Mohs surgery  2006    left side of face  . Polypectomy    . Colonoscopy    . Thyroid surgery  2011    radioactive  . Vaginal hysterectomy  1988    DUB    Allergies: Lipitor and Zocor  Current Outpatient Prescriptions  Medication Sig Dispense Refill  . atenolol (TENORMIN) 100 MG tablet TAKE 1 TABLET EVERY DAY  90 tablet  1  . Calcium Carbonate-Vitamin D (CALCIUM 600 + D PO) Take 1 tablet by mouth daily as needed.       . clonazePAM (KLONOPIN) 0.5 MG tablet TAKE 1 TABLET BY MOUTH AS NEEDED FOR ANXIETY  30 tablet  0  . DEXILANT 60 MG capsule TAKE 1 CAPSULE (60 MG TOTAL) BY MOUTH 2 (TWO) TIMES  DAILY.  180 capsule  1  . escitalopram (LEXAPRO) 20 MG tablet Take 1 tablet (20 mg total) by mouth daily.  30 tablet  0  . gabapentin (NEURONTIN) 300 MG capsule daily at bedtime prn      . hydrochlorothiazide (HYDRODIURIL) 25 MG tablet TAKE 1 TABLET EVERY DAY  90 tablet  1  . levothyroxine (SYNTHROID, LEVOTHROID) 175 MCG tablet Take 175 mcg by mouth daily. Take 1 tablet [= 164mcg] by mouth daily Monday thru Friday and 238mcg on Saturdays and sundays      . levothyroxine (SYNTHROID, LEVOTHROID) 200 MCG tablet Take 200 mcg by mouth. Take 1 pill Saturday & sunday      . Multiple Vitamins-Minerals (MULTI FOR HER 50+) CAPS Take 1 capsule by mouth daily.      . pravastatin (PRAVACHOL) 40 MG tablet TAKE 1 TABLET EVERY DAY  90 tablet  0  . SUMAtriptan (IMITREX) 20 MG/ACT nasal spray Place 1 spray (20 mg total) into the nose every 2 (two) hours as needed for migraine.   1 Inhaler  1  . traMADol (ULTRAM) 50 MG tablet Take 1 tablet by mouth as needed. migraines      . zolpidem (AMBIEN) 10 MG tablet TAKE 1 TABLET BY MOUTH AT BEDTIME  30 tablet  0   No current facility-administered medications for this visit.    ROS: Pertinent items are noted in HPI.  Exam:    BP 120/80  Pulse 72  Temp(Src) 98.1 F (36.7 C) (Oral)  Resp 16  Ht 5' 5.25" (1.657 m)  Wt 279 lb (126.554 kg)  BMI 46.09 kg/m2  LMP 05/24/1986 Weight change: @WEIGHTCHANGE @ Last 3 height recordings:  Ht Readings from Last 3 Encounters:  06/15/13 5' 5.25" (1.657 m)  12/12/12 5' 5.5" (1.664 m)  10/06/12 5' 5.5" (1.664 m)   General appearance: alert, cooperative and appears stated age Head: Normocephalic, without obvious abnormality, atraumatic, no nystagmus Neck: no adenopathy, no carotid bruit, no JVD, supple, symmetrical, trachea midline and thyroid not enlarged, symmetric, no tenderness/mass/nodules Lungs: clear to auscultation bilaterally Breasts: normal appearance, no masses or tenderness Heart: regular rate and rhythm, S1, S2 normal, no murmur, click, rub or gallop Abdomen: soft, non-tender; bowel sounds normal; no masses,  no organomegaly Extremities: extremities normal, atraumatic, no cyanosis or edema Skin: Skin color, texture, turgor normal. No rashes or lesions Lymph nodes: Cervical, supraclavicular, and axillary nodes normal. no inguinal nodes palpated Neurologic: Grossly normal   Pelvic: External genitalia:  no lesions              Urethra: normal appearing urethra with no masses, tenderness or lesions              Bartholins and Skenes: normal                 Vagina: normal appearing vagina with normal color and discharge, no lesions              Cervix: absent              Pap taken: no        Bimanual Exam:  Uterus:  absent                                      Adnexa:    no masses  Rectovaginal: Confirms                                       Anus:  normal sphincter tone, no lesions  A: well woman Anxiety on meds veritgo     P: mammogram annual pap smear not indicated Refill anxiety medications Questionable UTI will send cx Recommend pt see either PCP or urgent care or ENT counseled on breast self exam, mammography screening, adequate intake of calcium and vitamin D, diet and exercise return annually or prn Discussed PAP guideline changes, importance of weight bearing exercises, calcium, vit D and balanced diet.  An After Visit Summary was printed and given to the patient.

## 2013-06-15 NOTE — Patient Instructions (Signed)

## 2013-06-16 ENCOUNTER — Other Ambulatory Visit: Payer: Self-pay | Admitting: Family Medicine

## 2013-06-16 ENCOUNTER — Other Ambulatory Visit: Payer: Self-pay | Admitting: Nurse Practitioner

## 2013-06-17 LAB — URINE CULTURE

## 2013-06-18 ENCOUNTER — Other Ambulatory Visit: Payer: Self-pay | Admitting: Nurse Practitioner

## 2013-06-18 ENCOUNTER — Telehealth: Payer: Self-pay | Admitting: *Deleted

## 2013-06-18 MED ORDER — FLUCONAZOLE 150 MG PO TABS: 150.0000 mg | ORAL_TABLET | Freq: Once | ORAL | Status: DC

## 2013-06-18 MED ORDER — CIPROFLOXACIN HCL 500 MG PO TABS: 500.0000 mg | ORAL_TABLET | Freq: Two times a day (BID) | ORAL | Status: DC

## 2013-06-18 NOTE — Addendum Note (Signed)
Addended by: Alfonzo Feller on: 06/18/2013 02:18 PM   Modules accepted: Orders

## 2013-06-18 NOTE — Telephone Encounter (Signed)
Message copied by Alfonzo Feller on Mon Jun 18, 2013 11:15 AM ------      Message from: Elveria Rising      Created: Sun Jun 17, 2013 11:25 AM       Species pending, can send in antibiotic now or get id and send in the one with the best coverage, depends on symptoms having ------

## 2013-06-18 NOTE — Telephone Encounter (Signed)
Left Message To Call Back  

## 2013-06-18 NOTE — Telephone Encounter (Signed)
Patient notified see labs 

## 2013-06-18 NOTE — Addendum Note (Signed)
Addended by: Elveria Rising on: 06/18/2013 02:53 PM   Modules accepted: Orders

## 2013-06-18 NOTE — Telephone Encounter (Signed)
Patient returning Jasmine's call.  

## 2013-06-19 NOTE — Telephone Encounter (Signed)
ntbs

## 2013-06-19 NOTE — Telephone Encounter (Signed)
Last seen 12/12/12  Dr Ernestina Patches  No lipids since [facility]

## 2013-06-19 NOTE — Telephone Encounter (Signed)
Last seen 12/12/12  Dr Newton 

## 2013-06-28 ENCOUNTER — Ambulatory Visit: Payer: BC Managed Care – PPO | Admitting: Nurse Practitioner

## 2013-07-02 ENCOUNTER — Other Ambulatory Visit: Payer: Self-pay | Admitting: Nurse Practitioner

## 2013-07-03 NOTE — Telephone Encounter (Signed)
Last seen 12/12/12  Dr Newton 

## 2013-07-09 ENCOUNTER — Other Ambulatory Visit: Payer: Self-pay | Admitting: *Deleted

## 2013-07-09 DIAGNOSIS — F411 Generalized anxiety disorder: Secondary | ICD-10-CM

## 2013-07-09 MED ORDER — ESCITALOPRAM OXALATE 20 MG PO TABS: 20.0000 mg | ORAL_TABLET | Freq: Every day | ORAL | Status: DC

## 2013-07-09 NOTE — Telephone Encounter (Signed)
Annual Exam 06/15/13  Rx was sent for Lexapro 20mg  #30 x 11 refills, Pharmacy asking for a 90 day supply [facility] 20[facility]  #90 x 3 refills.

## 2013-07-10 ENCOUNTER — Other Ambulatory Visit: Payer: Self-pay | Admitting: Nurse Practitioner

## 2013-07-12 NOTE — Telephone Encounter (Signed)
Last seen 12/12/12  Dr Ernestina Patches

## 2013-07-30 ENCOUNTER — Other Ambulatory Visit: Payer: Self-pay | Admitting: Internal Medicine

## 2013-08-06 ENCOUNTER — Other Ambulatory Visit: Payer: Self-pay | Admitting: Family Medicine

## 2013-08-07 IMAGING — CR DG CHEST 2V
2 series · 2 of 2 positions shown · non-contrast
Comparison: None.

CLINICAL DATA: Chronic cough, sleep apnea

CHEST - 2 VIEW

[view not recorded (1 of 2)]
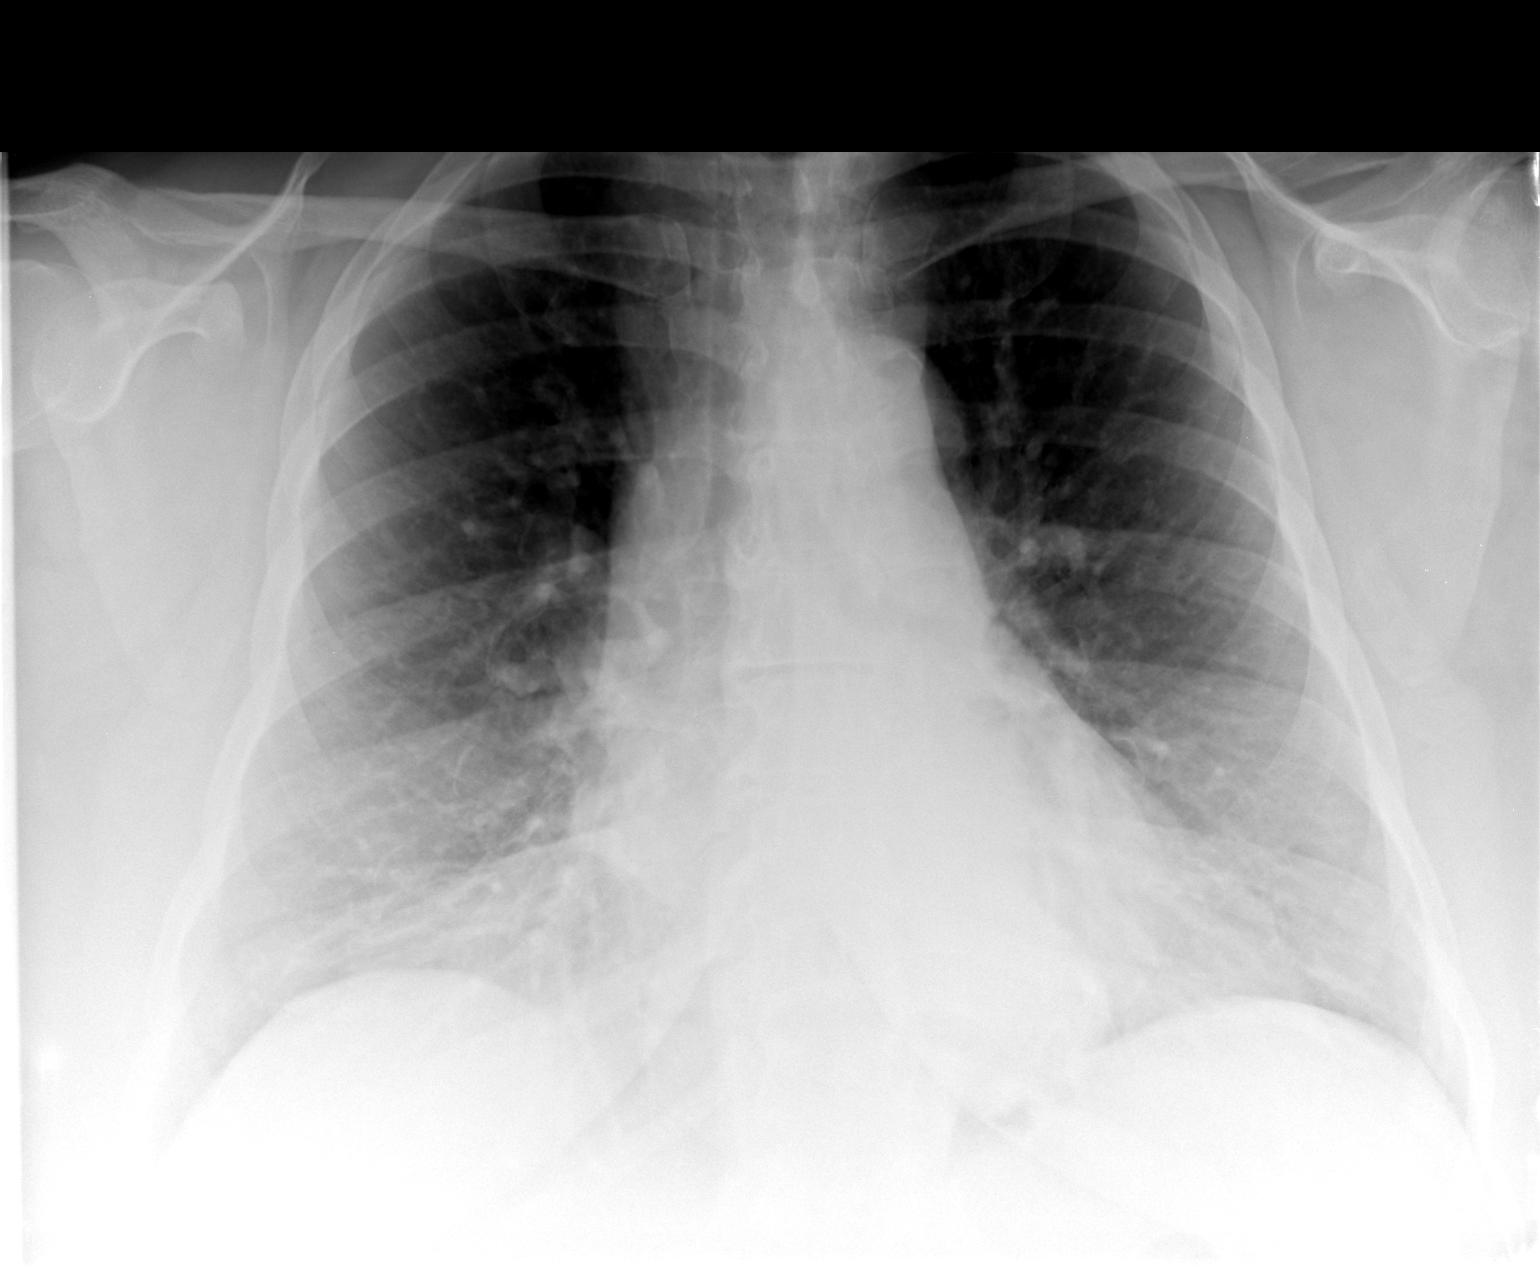

[view not recorded (2 of 2)]
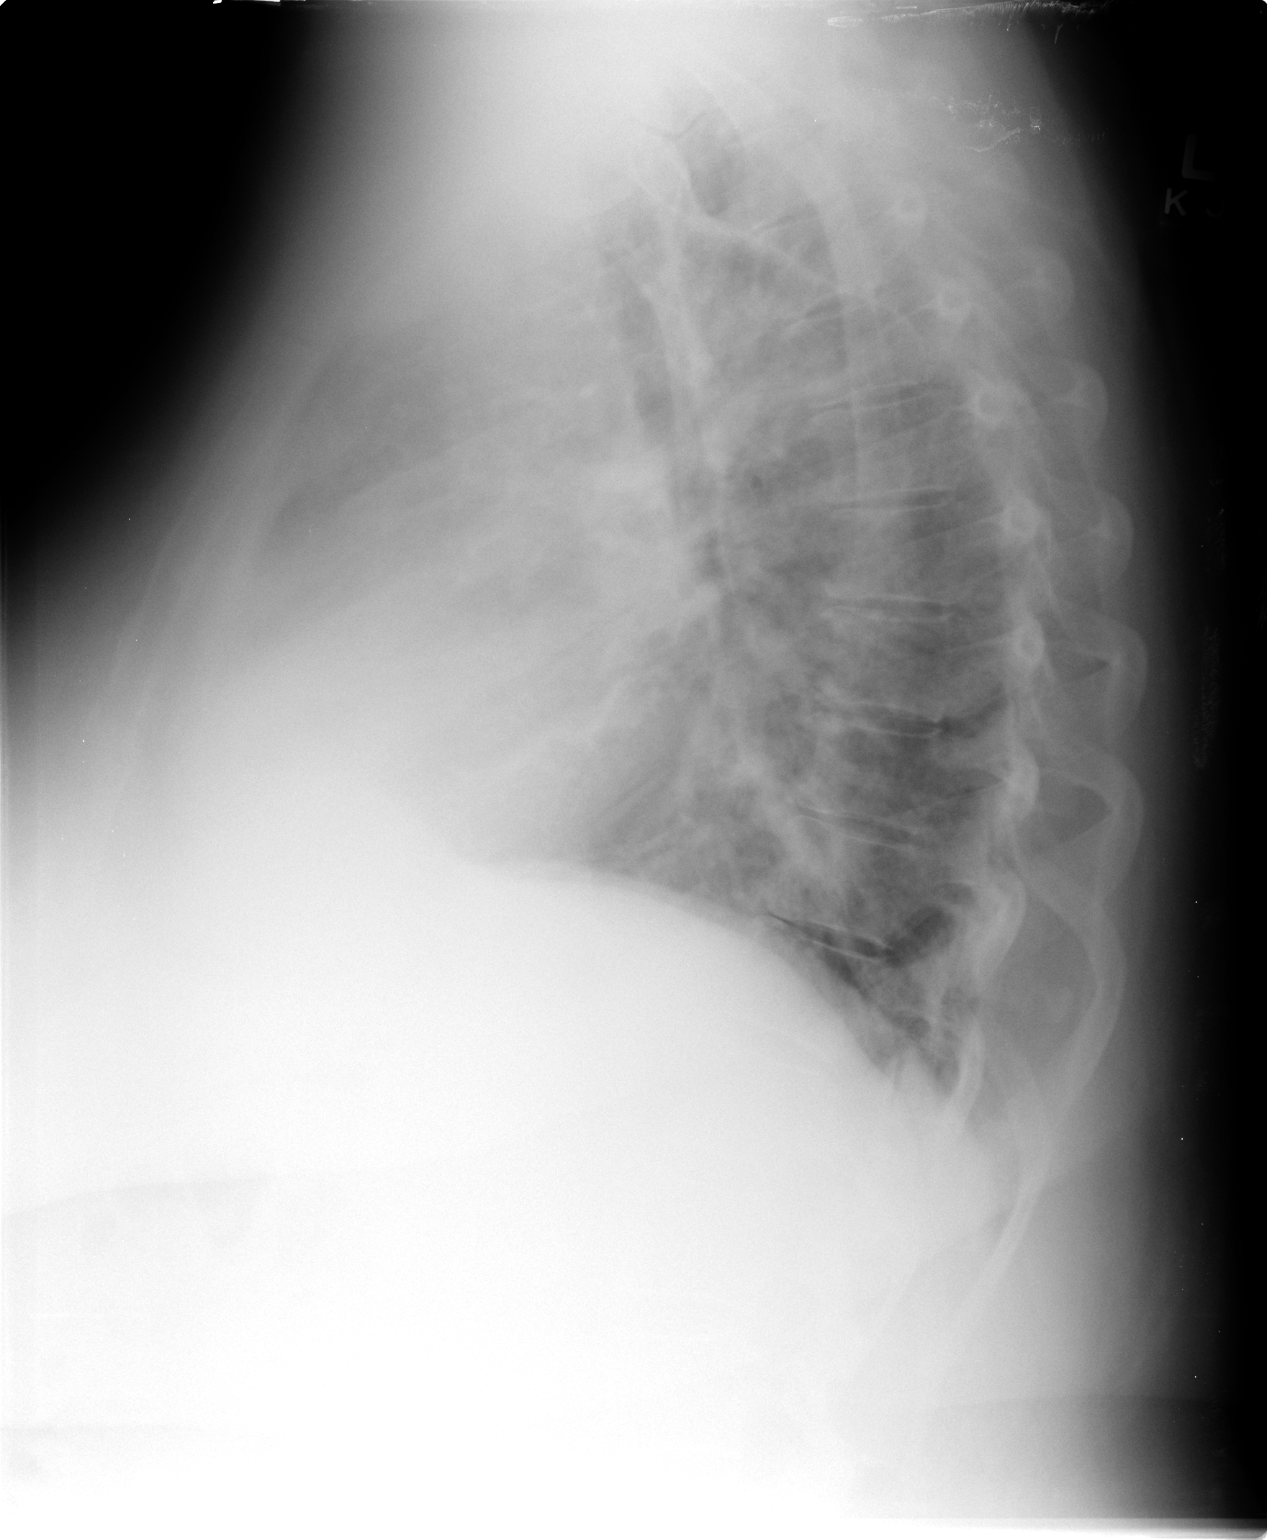

[2 of 2 positions shown; findings below may reference images not displayed]

FINDINGS: The study is limited by patient's body habitus.  No acute
infiltrate or pleural effusion.  No pulmonary edema.  Probable
small hiatal hernia.
IMPRESSION: No active disease.  Probable small hiatal hernia.

## 2013-08-08 NOTE — Telephone Encounter (Signed)
Last ov 7/14. 

## 2013-09-14 ENCOUNTER — Other Ambulatory Visit: Payer: Self-pay | Admitting: Family Medicine

## 2013-09-17 ENCOUNTER — Other Ambulatory Visit: Payer: Self-pay

## 2013-09-17 MED ORDER — HYDROCHLOROTHIAZIDE 25 MG PO TABS: 25.0000 mg | ORAL_TABLET | Freq: Every day | ORAL | Status: DC

## 2013-09-17 NOTE — Telephone Encounter (Signed)
Can only have 1 month supply- Patient NTBS for follow up and lab work

## 2013-09-17 NOTE — Telephone Encounter (Signed)
Call patient : Prescription refilled & sent to pharmacy in [facility]. 

## 2013-09-17 NOTE — Telephone Encounter (Signed)
Last seen 12/12/12  Dr Newton  Requesting a 90 day supply 

## 2013-09-17 NOTE — Telephone Encounter (Signed)
Last seen 12/12/12  Dr Ernestina Patches  Requesting a 90 day supply

## 2013-10-22 ENCOUNTER — Other Ambulatory Visit: Payer: Self-pay

## 2013-10-24 ENCOUNTER — Telehealth: Payer: Self-pay | Admitting: Nurse Practitioner

## 2013-10-24 MED ORDER — HYDROCHLOROTHIAZIDE 25 MG PO TABS: 25.0000 mg | ORAL_TABLET | Freq: Every day | ORAL | Status: DC

## 2013-10-24 NOTE — Telephone Encounter (Signed)
appt given can we refill medication

## 2013-11-14 ENCOUNTER — Encounter: Payer: Self-pay | Admitting: Nurse Practitioner

## 2013-11-14 ENCOUNTER — Ambulatory Visit (INDEPENDENT_AMBULATORY_CARE_PROVIDER_SITE_OTHER): Payer: BC Managed Care – PPO | Admitting: Nurse Practitioner

## 2013-11-14 ENCOUNTER — Telehealth: Payer: Self-pay | Admitting: Pulmonary Disease

## 2013-11-14 VITALS — BP 124/78 | HR 75 | Temp 98.5°F | Ht 65.25 in | Wt 256.6 lb

## 2013-11-14 DIAGNOSIS — R739 Hyperglycemia, unspecified: Secondary | ICD-10-CM

## 2013-11-14 DIAGNOSIS — K589 Irritable bowel syndrome without diarrhea: Secondary | ICD-10-CM

## 2013-11-14 DIAGNOSIS — G4733 Obstructive sleep apnea (adult) (pediatric): Secondary | ICD-10-CM

## 2013-11-14 DIAGNOSIS — G43111 Migraine with aura, intractable, with status migrainosus: Secondary | ICD-10-CM

## 2013-11-14 DIAGNOSIS — F329 Major depressive disorder, single episode, unspecified: Secondary | ICD-10-CM

## 2013-11-14 DIAGNOSIS — R7309 Other abnormal glucose: Secondary | ICD-10-CM

## 2013-11-14 DIAGNOSIS — E0590 Thyrotoxicosis, unspecified without thyrotoxic crisis or storm: Secondary | ICD-10-CM

## 2013-11-14 DIAGNOSIS — E785 Hyperlipidemia, unspecified: Secondary | ICD-10-CM

## 2013-11-14 DIAGNOSIS — G4700 Insomnia, unspecified: Secondary | ICD-10-CM

## 2013-11-14 DIAGNOSIS — F3289 Other specified depressive episodes: Secondary | ICD-10-CM

## 2013-11-14 DIAGNOSIS — F32A Depression, unspecified: Secondary | ICD-10-CM

## 2013-11-14 DIAGNOSIS — I10 Essential (primary) hypertension: Secondary | ICD-10-CM

## 2013-11-14 DIAGNOSIS — K219 Gastro-esophageal reflux disease without esophagitis: Secondary | ICD-10-CM

## 2013-11-14 DIAGNOSIS — F411 Generalized anxiety disorder: Secondary | ICD-10-CM

## 2013-11-14 DIAGNOSIS — G43101 Migraine with aura, not intractable, with status migrainosus: Secondary | ICD-10-CM

## 2013-11-14 LAB — COMPLETE METABOLIC PANEL WITH GFR
ALT: 18 U/L (ref 0–35)
AST: 17 U/L (ref 0–37)
Albumin: 3.9 g/dL (ref 3.5–5.2)
Alkaline Phosphatase: 77 U/L (ref 39–117)
BUN: 16 mg/dL (ref 6–23)
CO2: 34 meq/L — AB (ref 19–32)
Calcium: 9.3 mg/dL (ref 8.4–10.5)
Chloride: 98 mEq/L (ref 96–112)
Creat: 0.87 mg/dL (ref 0.50–1.10)
GFR, EST AFRICAN AMERICAN: 85 mL/min
GFR, Est Non African American: 74 mL/min
Glucose, Bld: 143 mg/dL — ABNORMAL HIGH (ref 70–99)
Potassium: 4 mEq/L (ref 3.5–5.3)
SODIUM: 142 meq/L (ref 135–145)
TOTAL PROTEIN: 6 g/dL (ref 6.0–8.3)
Total Bilirubin: 0.5 mg/dL (ref 0.2–1.2)

## 2013-11-14 LAB — THYROID PANEL WITH TSH
FREE THYROXINE INDEX: 4.8 — AB (ref 1.0–3.9)
T3 Uptake: 30.6 % (ref 22.5–37.0)
T4 TOTAL: 15.6 ug/dL — AB (ref 5.0–12.5)
TSH: 0.961 u[IU]/mL (ref 0.350–4.500)

## 2013-11-14 LAB — LIPID PANEL
Cholesterol: 227 mg/dL — ABNORMAL HIGH (ref 0–200)
HDL: 44 mg/dL (ref >39)
LDL Cholesterol: 130 mg/dL — ABNORMAL HIGH (ref 0–99)
TRIGLYCERIDES: 266 mg/dL — AB (ref <150)
Total CHOL/HDL Ratio: 5.2 Ratio
VLDL: 53 mg/dL — ABNORMAL HIGH (ref 0–40)

## 2013-11-14 MED ORDER — DEXLANSOPRAZOLE 60 MG PO CPDR: DELAYED_RELEASE_CAPSULE | ORAL | Status: DC

## 2013-11-14 MED ORDER — ESCITALOPRAM OXALATE 20 MG PO TABS: 20.0000 mg | ORAL_TABLET | Freq: Every day | ORAL | Status: DC

## 2013-11-14 MED ORDER — SUMATRIPTAN 20 MG/ACT NA SOLN: 1.0000 | NASAL | Status: DC | PRN

## 2013-11-14 MED ORDER — TRAMADOL HCL 50 MG PO TABS: 50.0000 mg | ORAL_TABLET | ORAL | Status: DC | PRN

## 2013-11-14 MED ORDER — HYDROCHLOROTHIAZIDE 25 MG PO TABS: 25.0000 mg | ORAL_TABLET | Freq: Every day | ORAL | Status: DC

## 2013-11-14 MED ORDER — ATENOLOL 100 MG PO TABS: ORAL_TABLET | ORAL | Status: DC

## 2013-11-14 NOTE — Patient Instructions (Signed)

## 2013-11-14 NOTE — Progress Notes (Signed)
Subjective:    Patient ID: [name], female    DOB: 18-Apr-1956, 58 y.o.   MRN: [id number]  Hypertension This is a chronic problem. The current episode started more than 1 year ago. The problem is unchanged. The problem is controlled. Pertinent negatives include no anxiety, chest pain, neck pain, palpitations, peripheral edema or shortness of breath. Agents associated with hypertension include thyroid hormones. Risk factors for coronary artery disease include stress, obesity, dyslipidemia and family history. Past treatments include beta blockers and diuretics. The current treatment provides moderate improvement. Compliance problems include diet and exercise.  Hypertensive end-organ damage includes a thyroid problem.  Hyperlipidemia This is a chronic problem. The current episode started more than 1 year ago. The problem is uncontrolled. Recent lipid tests were reviewed and are variable. Exacerbating diseases include hypothyroidism and obesity. She has no history of diabetes. Pertinent negatives include no chest pain or shortness of breath. Treatments tried: REFUSES to take a statin due to myalgia. Cant take fish oil becauseof GERD. The current treatment provides no improvement of lipids. Risk factors for coronary artery disease include dyslipidemia, family history, obesity, hypertension and post-menopausal.  Thyroid Problem Presents for follow-up (hypothyroidism) visit. Patient reports no palpitations. The symptoms have been stable. Her past medical history is significant for hyperlipidemia. There is no history of diabetes.  insomnia ambien 10 qhs- sleeps well- uses CPAP nightly GAD/Depression Has tried to wean off of leapro but would get side effects- takes klonopin maybe 1X per week. GERD Dexilant keeps symptoms under control- thsi is the only med that has worked for her sympytoms. Migraine imitrex as needed- has not had one since February of 2015.   Review of Systems  Constitutional:  Negative.   HENT: Negative.   Respiratory: Negative for shortness of breath.   Cardiovascular: Negative for chest pain and palpitations.  Genitourinary: Negative.   Musculoskeletal: Negative for neck pain.  Neurological: Negative.   Psychiatric/Behavioral: Negative.   All other systems reviewed and are negative.      Objective:   Physical Exam  Constitutional: She is oriented to person, place, and time. She appears well-developed and well-nourished.  HENT:  Nose: Nose normal.  Mouth/Throat: Oropharynx is clear and moist.  Eyes: EOM are normal.  Neck: Trachea normal, normal range of motion and full passive range of motion without pain. Neck supple. No JVD present. Carotid bruit is not present. No thyromegaly present.  Cardiovascular: Normal rate, regular rhythm, normal heart sounds and intact distal pulses.  Exam reveals no gallop and no friction rub.   No murmur heard. Pulmonary/Chest: Effort normal and breath sounds normal.  Abdominal: Soft. Bowel sounds are normal. She exhibits no distension and no mass. There is no tenderness.  Musculoskeletal: Normal range of motion.  Lymphadenopathy:    She has no cervical adenopathy.  Neurological: She is alert and oriented to person, place, and time. She has normal reflexes.  Skin: Skin is warm and dry.  Psychiatric: She has a normal mood and affect. Her behavior is normal. Judgment and thought content normal.   BP 124/78  Pulse 75  Temp(Src) 98.5 F (36.9 C) (Oral)  Ht 5' 5.25" (1.657 m)  Wt 256 lb 9.6 oz (116.393 kg)  BMI 42.39 kg/m2  LMP 05/24/1986        Assessment & Plan:   1. IBS   2. Hyperthyroidism   3. Essential hypertension   4. Hyperlipidemia   5. Gastroesophageal reflux disease without esophagitis   6. OSA (obstructive sleep apnea)  7. GAD (generalized anxiety disorder)   8. Depression   9. Insomnia   10. Anxiety state, unspecified   11. Migraine with aura and with status migrainosus, not intractable     Orders Placed This Encounter  Procedures  . HM DEXA SCAN    This external order was created through the Results Console.  . CMP14+EGFR  . NMR, lipoprofile  . Thyroid Panel With TSH   Meds ordered this encounter  Medications  . escitalopram (LEXAPRO) 20 MG tablet    Sig: Take 1 tablet (20 mg total) by mouth daily.    Dispense:  90 tablet    Refill:  1    Order Specific Question:  Supervising Provider    Answer:  Chipper Herb [[id number]]  . atenolol (TENORMIN) 100 MG tablet    Sig: TAKE 1 TABLET EVERY DAY    Dispense:  90 tablet    Refill:  1    Order Specific Question:  Supervising Provider    Answer:  Chipper Herb [[id number]]  . dexlansoprazole (DEXILANT) 60 MG capsule    Sig: TAKE 1 CAPSULE (60 MG TOTAL) BY MOUTH 2 (TWO) TIMES DAILY.    Dispense:  180 capsule    Refill:  1    Order Specific Question:  Supervising Provider    Answer:  Chipper Herb [[id number]]  . hydrochlorothiazide (HYDRODIURIL) 25 MG tablet    Sig: Take 1 tablet (25 mg total) by mouth daily.    Dispense:  90 tablet    Refill:  1    Order Specific Question:  Supervising Provider    Answer:  Chipper Herb [[id number]]  . SUMAtriptan (IMITREX) 20 MG/ACT nasal spray    Sig: Place 1 spray (20 mg total) into the nose every 2 (two) hours as needed for migraine.    Dispense:  1 Inhaler    Refill:  1    Order Specific Question:  Supervising Provider    Answer:  Chipper Herb [[id number]]  . traMADol (ULTRAM) 50 MG tablet    Sig: Take 1 tablet (50 mg total) by mouth as needed. migraines    Dispense:  30 tablet    Refill:  0    Order Specific Question:  Supervising Provider    Answer:  Chipper Herb [[id number]]    Labs pending Health maintenance reviewed Diet and exercise encouraged Continue all meds Follow up  In 3 months   [place], FNP

## 2013-11-14 NOTE — Telephone Encounter (Signed)
VS, are you okay with overbooking schedule on 11/29/13 at 11:30 am to see this pt since she will be in town? Please advise, thanks

## 2013-11-14 NOTE — Telephone Encounter (Signed)
MyChart Message copied below:       From: Shetley,Vincenza    Sent: 11/14/2013 12:24 PM EDT      To: Patient Appointment Schedule Request Mailing List Subject: Appointment Request  Appointment Request From: Gaye Pollack  With Provider: Chesley Mires MD [facility]]  Preferred Date Range: From 11/29/2013 To 11/29/2013  Preferred Times: Thursday Morning  Reason for visit: Office Visit  Comments: needs 11:30am  if possible.  i live out of town, and will coming that morning for another dr. visit.  this is just for a well-check

## 2013-11-14 NOTE — Addendum Note (Signed)
Addended by: Pollyann Kennedy F on: 11/14/2013 11:46 AM   Modules accepted: Orders

## 2013-11-14 NOTE — Telephone Encounter (Signed)
Okay to double book visit. 

## 2013-11-15 NOTE — Telephone Encounter (Signed)
Patient returned call

## 2013-11-15 NOTE — Telephone Encounter (Signed)
LMOM x 1 

## 2013-11-15 NOTE — Telephone Encounter (Signed)
Spoke with the pt and scheduled appt with VS for 11/29/13 at 11:00 am  Nothing further needed per pt

## 2013-11-15 NOTE — Addendum Note (Signed)
Addended by: Chevis Pretty on: 11/15/2013 08:27 AM   Modules accepted: Orders

## 2013-11-29 ENCOUNTER — Ambulatory Visit (INDEPENDENT_AMBULATORY_CARE_PROVIDER_SITE_OTHER): Payer: BC Managed Care – PPO | Admitting: Pulmonary Disease

## 2013-11-29 ENCOUNTER — Encounter: Payer: Self-pay | Admitting: Pulmonary Disease

## 2013-11-29 VITALS — BP 130/80 | HR 67 | Ht 65.5 in | Wt 262.8 lb

## 2013-11-29 DIAGNOSIS — R053 Chronic cough: Secondary | ICD-10-CM

## 2013-11-29 DIAGNOSIS — R05 Cough: Secondary | ICD-10-CM

## 2013-11-29 DIAGNOSIS — R059 Cough, unspecified: Secondary | ICD-10-CM

## 2013-11-29 DIAGNOSIS — G4733 Obstructive sleep apnea (adult) (pediatric): Secondary | ICD-10-CM

## 2013-11-29 NOTE — Progress Notes (Signed)
Chief Complaint  Patient presents with  . Follow-up    Wears CPAP nightly. Denies problems with mask. Pt reports that she raised the pressure herself -- seems to work very well.     History of Present Illness: [name] is a 58 y.o. female former smoker with sleep apnea, and cough in the setting of reflux.  She is doing very well with CPAP.  She is sleeping much better.  She has full face mask.  Her only issue with mask is related to her straps for mask >> when she got replacement mask, she was sent straps that were smaller.  As a result her straps get too tight.  She is using her old straps for now, and this is working.  Since starting CPAP she is not having issues with reflux.  As a result she is not having cough.  She has not felt this good in years.  Tests: Barium esophogram 07/05/12 >> Prominent, spontaneous GERD reaching upper thoracic esophagus CXR 07/19/12 >> small hiatal hernia, otherwise normal PFT 08/01/12 >> FEV1 2.50 (87%), FEV1% 85, TLC 5.04 (92%), DLCO 76% PSG 08/03/12 >> AHI 108.6, SpO2 low 83%; CPAP 11 cm H2O >> AHI 4.3, +R, +S, PLMI 6 >>3.5.  [name]  has a past medical history of Anxiety; Arthritis; Skin cancer; Depression; GERD (gastroesophageal reflux disease); Hypertension; Hyperlipidemia; PPD positive, treated (1995); Ulcer (1982); Thyroid disease; Hyperplastic colonic polyp; IBS (irritable bowel syndrome); OSA (obstructive sleep apnea) (07/25/2012); Migraines; Hiatal hernia; Exposure to TB; and Basal cell carcinoma of face (2008).  [name]  has past surgical history that includes mos; Mohs surgery (2006); Polypectomy; Colonoscopy; Thyroid surgery (2011); and Vaginal hysterectomy (1988).  Prior to Admission medications   Medication Sig Start Date End Date Taking? Authorizing Provider  atenolol (TENORMIN) 100 MG tablet Take 100 mg by mouth daily.     Yes Historical Provider, MD  Calcium Carbonate-Vitamin D (CALCIUM 600 + D PO) Take 1 tablet by  mouth daily.   Yes Historical Provider, MD  clonazePAM (KLONOPIN) 0.5 MG tablet Take 1 tablet by mouth at bedtime. 07/23/11  Yes Historical Provider, MD  dexlansoprazole (DEXILANT) 60 MG capsule Take 1 capsule (60 mg total) by mouth 2 (two) times daily. 06/28/12  Yes Lafayette Dragon, MD  diazepam (VALIUM) 5 MG tablet Take 5 mg by mouth at bedtime as needed.     Yes Historical Provider, MD  escitalopram (LEXAPRO) 20 MG tablet Take 1 tablet by mouth Daily. 08/06/11  Yes Historical Provider, MD  Fish Oil-Cholecalciferol (FISH OIL + D3) 1200-1000 MG-UNIT CAPS Take 1 capsule by mouth daily.   Yes Historical Provider, MD  hydrochlorothiazide 25 MG tablet Take 25 mg by mouth daily.     Yes Historical Provider, MD  levothyroxine (SYNTHROID, LEVOTHROID) 200 MCG tablet Take 1 tablet by mouth Daily. 157mg  Mon-Friday then 200 mg Sat and Sunday 08/19/11  Yes Historical Provider, MD  metoCLOPramide (REGLAN) 5 MG tablet Take 1 tablet (5 mg total) by mouth at bedtime. 06/28/12  Yes Lafayette Dragon, MD  Multiple Vitamins-Minerals (MULTI FOR HER 50+) CAPS Take 1 capsule by mouth daily.   Yes Historical Provider, MD  pravastatin (PRAVACHOL) 40 MG tablet Take 40 mg by mouth daily.     Yes Historical Provider, MD  ranitidine (ZANTAC) 150 MG tablet Take 150 mg by mouth 1 day or 1 dose.   Yes Historical Provider, MD  traMADol (ULTRAM) 50 MG tablet Take 1 tablet by mouth as needed. migraines 08/11/11  Yes Historical Provider, MD  zolpidem (AMBIEN) 10 MG tablet Take 1 tablet by mouth at bedtime. 08/08/11  Yes Historical Provider, MD    Allergies  Allergen Reactions  . Lipitor [Atorvastatin Calcium]     myalgia  . Statins     Myalgia   . Zocor [Simvastatin - High Dose]     myalgia    Physical Exam:  General - No distress ENT - No sinus tenderness, MP 3, no oral exudate, no LAN Cardiac - s1s2 regular, no murmur Chest - No wheeze/rales/dullness Back - No focal tenderness Abd - Soft, non-tender Ext - No edema Neuro -  Normal strength Skin - No rashes Psych - Normal mood, and behavior  Assessment/Plan:  Chesley Mires, MD [facility] Pager:  [phone] 11/29/2013, 11:07 AM

## 2013-11-29 NOTE — Assessment & Plan Note (Signed)
She has done well with CPAP 14 cm H2O.  She is compliant with therapy and reports benefit.  Advised her to d/w her DME if she has issues with her CPAP mask fit.

## 2013-11-29 NOTE — Assessment & Plan Note (Signed)
Related to reflux.  Much improved since last visit.

## 2013-11-29 NOTE — Patient Instructions (Signed)
Follow up in 1 year.

## 2013-12-26 ENCOUNTER — Other Ambulatory Visit: Payer: Self-pay | Admitting: Gynecology

## 2013-12-26 NOTE — Telephone Encounter (Signed)
eScribe request from [facility] for refill on CLONAZEPAM AND AMBIEN Last filled - 06/15/13, #30 X 5 (BOTH) Last AEX - 06/15/13 Next AEX - 06/21/14  Please advise refills.  RX over six months old.

## 2013-12-28 ENCOUNTER — Telehealth: Payer: Self-pay | Admitting: Gynecology

## 2013-12-28 NOTE — Telephone Encounter (Signed)
Patient is calling for refill status request. Patient says her pharmacy requested two refill on Wed. 12/26/13 and has had no response from our office. Patient needs refills today. Please call with status.

## 2013-12-28 NOTE — Telephone Encounter (Signed)
Both rx's printed and signed by Dr. Charlies Constable LM on patient's vm (confirmed first and last name) that rx has been sent.  Routed to provider for review, encounter closed.

## 2013-12-28 NOTE — Telephone Encounter (Signed)
RX faxed to pharmacy by J. Brigitte Pulse, CMA.

## 2013-12-28 NOTE — Telephone Encounter (Signed)
Per Colletta [place]: I notified the patient the patient the RX is in the process of being sent to the pharmacy this afternoon. Patient appreciative.  See closed phone note:  Graylon Good, CMA at 12/26/2013 3:01 PM    Status: Signed       eScribe request from [facility] for refill on CLONAZEPAM AND AMBIEN  Last filled - 06/15/13, #30 X 5 (BOTH)  Last AEX - 06/15/13  Next AEX - 06/21/14  Please advise refills. RX over six months old.                 Routing History     Priority Sent On From To Message Type     12/28/2013 1:26 PM Azalia Bilis, MD Graylon Good, CMA Rx Response     12/26/2013 3:03 PM Graylon Good, CMA Azalia Bilis, MD Rx Request     12/26/2013 1:08 PM [employer] Out Interface [facility] Rx Refill Pool Rx Request

## 2013-12-31 ENCOUNTER — Encounter: Payer: Self-pay | Admitting: Internal Medicine

## 2014-01-16 ENCOUNTER — Ambulatory Visit: Payer: BC Managed Care – PPO | Admitting: Pulmonary Disease

## 2014-01-21 ENCOUNTER — Other Ambulatory Visit: Payer: Self-pay | Admitting: Nurse Practitioner

## 2014-01-23 ENCOUNTER — Other Ambulatory Visit: Payer: Self-pay | Admitting: Gynecology

## 2014-01-24 NOTE — Telephone Encounter (Signed)
Last AEX: 06/15/13 Last refill:12/28/13 #30 X 0 (both rx) Current AEX:06/21/14  Please advise

## 2014-01-25 MED ORDER — CLONAZEPAM 0.5 MG PO TABS: ORAL_TABLET | ORAL | Status: DC

## 2014-01-25 MED ORDER — ZOLPIDEM TARTRATE 10 MG PO TABS: ORAL_TABLET | ORAL | Status: DC

## 2014-01-25 NOTE — Addendum Note (Signed)
Addended by: Alfonzo Feller on: 01/25/2014 04:20 PM   Modules accepted: Orders

## 2014-01-25 NOTE — Telephone Encounter (Signed)
Dr. Charlies Constable, any decision on these rx

## 2014-01-25 NOTE — Telephone Encounter (Signed)
RX printed signed by Dr.Lathrop and faxed.

## 2014-01-25 NOTE — Telephone Encounter (Signed)
Pt is calling about refills asking if she can give them to her for more than 1 month

## 2014-01-25 NOTE — Addendum Note (Signed)
Addended by: Alfonzo Feller on: 01/25/2014 04:16 PM   Modules accepted: Orders

## 2014-03-25 ENCOUNTER — Encounter: Payer: Self-pay | Admitting: Pulmonary Disease

## 2014-04-23 ENCOUNTER — Other Ambulatory Visit: Payer: Self-pay | Admitting: Nurse Practitioner

## 2014-04-23 NOTE — Telephone Encounter (Signed)
Patient NTBS for follow up and lab work  

## 2014-04-23 NOTE — Telephone Encounter (Signed)
Last ov 6/15

## 2014-04-25 NOTE — Telephone Encounter (Signed)
appt made

## 2014-05-15 ENCOUNTER — Other Ambulatory Visit: Payer: Self-pay | Admitting: *Deleted

## 2014-05-15 NOTE — Telephone Encounter (Signed)
Medication refill request: Clonazepam 0.5 mg  Last AEX:  06/15/13 with Dr. Charlies Constable Next AEX: 06/24/14 with Ms. Patty Last MMG (if hormonal medication request): N/A Refill authorized: please advise.

## 2014-05-17 ENCOUNTER — Other Ambulatory Visit: Payer: Self-pay | Admitting: Nurse Practitioner

## 2014-05-20 ENCOUNTER — Telehealth: Payer: Self-pay | Admitting: Obstetrics & Gynecology

## 2014-05-20 ENCOUNTER — Other Ambulatory Visit: Payer: Self-pay

## 2014-05-20 MED ORDER — ZOLPIDEM TARTRATE 10 MG PO TABS: ORAL_TABLET | ORAL | Status: DC

## 2014-05-20 MED ORDER — CLONAZEPAM 0.5 MG PO TABS: 0.5000 mg | ORAL_TABLET | Freq: Two times a day (BID) | ORAL | Status: DC | PRN

## 2014-05-20 NOTE — Telephone Encounter (Signed)
Clonazepam is not a medication that I or Dr. Quincy Simmonds prescribe for our patients.  This is something Dr. Charlies Constable started for her but this is not something that I can continue long term for her.  I will prescribe for 30 days but she will need to be seen by psychiatry to continue this medication.

## 2014-05-20 NOTE — Telephone Encounter (Signed)
S/W pt and stated that the refill request has been sent to the provider. Pt was upset on how this has been handled. She states she doesn't understand why its taking so long. Pt was told, " the request has been sitting in Dr. Sabra Heck box since 05/15/14". She was also upset stating for the past 3 months she has had a problems getting anybody to call her back from our office. She stated she has been dropped from providers in this office and doesn't understand why another provider doesn't take care of things so this would not happen. She states she is considering going to another office because she " doesn't like how the office conducts business".  I stated to pt the refill request protocol and that I will speak to provider to see if anything can be and and that I will call her back with an answer.  Pt voiced understanding and agreed. Pt was appreciative and thanked me for calling her back promptly.   Sent to provider for review

## 2014-05-20 NOTE — Telephone Encounter (Signed)
Pt informed of info below. She voiced understanding and agreed to speak with her PCP about the rx. Rx has been faxed by Blanch Media, CMA. Pt informed  Encounter closed

## 2014-05-20 NOTE — Telephone Encounter (Signed)
Rx has been faxed by Blanch Media, CMA

## 2014-05-20 NOTE — Telephone Encounter (Signed)
Medication refill request: Ambien 10mg  Last AEX:  06/15/13 (l[place]rop) Next AEX: 06/24/14(Patti) Last MMG (if hormonal medication request): NA Refill authorized: until AEX

## 2014-05-20 NOTE — Telephone Encounter (Signed)
Rx printed, signed by Dr. Sabra Heck and faxed, patient has been made aware by Larene Beach, CMA

## 2014-05-20 NOTE — Telephone Encounter (Signed)
Patient is requesting status of her refill request Ambien and clonazePAM (KLONOPIN).  Patient needs ASAP. Please call today, she requested refills a week ago.

## 2014-06-21 ENCOUNTER — Ambulatory Visit: Payer: BC Managed Care – PPO | Admitting: Gynecology

## 2014-06-24 ENCOUNTER — Ambulatory Visit (INDEPENDENT_AMBULATORY_CARE_PROVIDER_SITE_OTHER): Payer: BLUE CROSS/BLUE SHIELD | Admitting: Nurse Practitioner

## 2014-06-24 ENCOUNTER — Encounter: Payer: Self-pay | Admitting: Nurse Practitioner

## 2014-06-24 VITALS — BP 122/76 | HR 72 | Ht 65.0 in | Wt 289.0 lb

## 2014-06-24 DIAGNOSIS — Z01419 Encounter for gynecological examination (general) (routine) without abnormal findings: Secondary | ICD-10-CM

## 2014-06-24 DIAGNOSIS — Z0000 Encounter for general adult medical examination without abnormal findings: Secondary | ICD-10-CM

## 2014-06-24 DIAGNOSIS — N8320 Unspecified ovarian cysts: Secondary | ICD-10-CM

## 2014-06-24 DIAGNOSIS — N83201 Unspecified ovarian cyst, right side: Secondary | ICD-10-CM

## 2014-06-24 MED ORDER — FLUCONAZOLE 150 MG PO TABS: 150.0000 mg | ORAL_TABLET | Freq: Once | ORAL | Status: DC

## 2014-06-24 MED ORDER — ZOLPIDEM TARTRATE 10 MG PO TABS: ORAL_TABLET | ORAL | Status: DC

## 2014-06-24 MED ORDER — CLONAZEPAM 0.5 MG PO TABS: ORAL_TABLET | ORAL | Status: DC

## 2014-06-24 NOTE — Progress Notes (Signed)
Patient ID: [name], female   DOB: [birth year], 59 y.o.   MRN: [id number] 59 y.o. [id number] Married Caucasian Fe here for annual exam.  Only new diagnosis since last year is sleep apnea.  Now BP is better on C Pap.  Currently on antibiotic for abscess lower abdomen that had I&D last Monday and now on Doxycycline for 10 days.  Now feels like yeast infection and asked for Diflucan.  Normally we had been giving the refills on Ambien and  Klonopin.  We have discussed these med's are better monitored by PCP since she has other health issues and co- morbidies.  She has apt. mid Feb with PCP,  Patient's last menstrual period was 05/24/1986.          Sexually active: Yes.    The current method of family planning is status post hysterectomy.    Exercising: Yes.    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 She has never used smokeless tobacco. She reports that she does not drink alcohol or use illicit drugs.  Past Medical History  Diagnosis Date  . Anxiety   . Arthritis 2015    spinal stenosis - Dr. Nelva Bush.  Epidural steroids  . Skin cancer     basal cell and 1 squamous cell on face and neck  . Depression   . GERD (gastroesophageal reflux disease)   . Hypertension   . Hyperlipidemia     diet controlled  . PPD positive, treated 1995    6 months INH  . Ulcer 1982    gastric  . Hyperplastic colonic polyp 2003 & 2008    #3 polyps first, 1 second time  . IBS (irritable bowel syndrome)   . OSA (obstructive sleep apnea) 07/25/2012    C- pap  . Migraines   . Hiatal hernia   . Exposure to TB     Treated x 6 months  . Basal cell carcinoma of face 2008    Left  . Thyroid disease 2011   hyperthyroid and treated with radio active iodine - now hypothyroid  . Abnormal Pap smear of cervix 1988    prior to hysterectomy    Past Surgical History  Procedure Laterality Date  . Mohs surgery  2006    left side of face  . Polypectomy  colon  . Colonoscopy    . Thyroid surgery  2011    radioactive  . Vaginal hysterectomy  1988    DUB and endometriosis    Current Outpatient Prescriptions  Medication Sig Dispense Refill  . atenolol (TENORMIN) 100 MG tablet TAKE 1 TABLET EVERY DAY 90 tablet 0  . Calcium Carbonate-Vitamin D (CALCIUM 600 + D PO) Take 1 tablet by mouth daily as needed.     . clonazePAM (KLONOPIN) 0.5 MG tablet TAKE 1 TABLET AS NEEDED FOR ANXIETY 14 tablet 0  . DEXILANT 60 MG capsule TAKE 1 CAPSULE (60 MG TOTAL) BY MOUTH 2 (TWO) TIMES DAILY. 180 capsule 0  . escitalopram (LEXAPRO) 20 MG tablet Take 1 tablet (20 mg total) by mouth daily. (Patient taking differently: Take 10 mg by mouth every other day. 06/2013, weaning off) 90 tablet 1  .  hydrochlorothiazide (HYDRODIURIL) 25 MG tablet TAKE 1 TABLET (25 MG TOTAL) BY MOUTH DAILY. 90 tablet 0  . levothyroxine (SYNTHROID, LEVOTHROID) 175 MCG tablet Take 175 mcg by mouth daily. Take one pill Monday-Friday.    . levothyroxine (SYNTHROID, LEVOTHROID) 200 MCG tablet Take 200 mcg by mouth. Take 1 pill Saturday & sunday    . Multiple Vitamins-Minerals (MULTI FOR HER 50+) CAPS Take 1 capsule by mouth daily.    Marland Kitchen zolpidem (AMBIEN) 10 MG tablet TAKE 1 TABLET AT BEDTIME 14 tablet 0  . fluconazole (DIFLUCAN) 150 MG tablet Take 1 tablet (150 mg total) by mouth once. Take one tablet.  Repeat in 48 hours if symptoms are not completely resolved. 2 tablet 0   No current facility-administered medications for this visit.    Family History  Problem Relation Age of Onset  . Adopted: Yes  . Lung cancer Mother   . Lung cancer Maternal Aunt   . Stroke Maternal Grandfather   . Diabetes Maternal Grandfather     ROS:  Pertinent items are  noted in HPI.  Otherwise, a comprehensive ROS was negative.  Exam:   BP 122/76 mmHg  Pulse 72  Ht 5\' 5"  (1.651 m)  Wt 289 lb (131.09 kg)  BMI 48.09 kg/m2  LMP 05/24/1986 Height: 5\' 5"  (165.1 cm) Ht Readings from Last 3 Encounters:  06/24/14 5\' 5"  (1.651 m)  11/29/13 5' 5.5" (1.664 m)  11/14/13 5' 5.25" (1.657 m)    General appearance: alert, cooperative and appears stated age Head: Normocephalic, without obvious abnormality, atraumatic Neck: no adenopathy, supple, symmetrical, trachea midline and thyroid normal to inspection and palpation Lungs: clear to auscultation bilaterally Breasts: normal appearance, no masses or tenderness Heart: regular rate and rhythm Abdomen: soft, non-tender; no masses,  no organomegaly Extremities: extremities normal, atraumatic, no cyanosis or edema Skin: Skin color, texture, turgor normal. No rashes or lesions, healing lesion right lower abdomen from previous seb cyst I&D Lymph nodes: Cervical, supraclavicular, and axillary nodes normal. No abnormal inguinal nodes palpated Neurologic: Grossly normal   Pelvic: External genitalia:  no lesions              Urethra:  normal appearing urethra with no masses, tenderness or lesions              Bartholin's and Skene's: normal                 Vagina: normal appearing vagina with normal color and discharge, no lesions              Cervix: absent              Pap taken: Yes.   Bimanual Exam:  Uterus:  uterus absent              Adnexa: no mass, fullness, tenderness - unable to feel any mass secondary to body habitus.               Rectovaginal: Confirms               Anus:  normal sphincter tone, no lesions  Chaperone present:  No   A:  Well Woman with normal exam  S/P TVH secondary to DUB and ?endometriosis 11/1986  History of right paraovarian cyst vs. solid mass in the past - last PUS 01/2006, MRI abdomen 02/19/06 and consult with Dr. Clarene Essex 03/01/2006 - see paper chart.  History of Anxiety and  depression on med's  History of HTN. Hypothyroid, morbid obesity, sleep apnea,  colon ployps  Remote history of CIN II 1988  Recent I&D of sebaceous cyst right lower abdomen on antibiotics   P:   Reviewed health and wellness pertinent to exam  Pap smear taken today  Mammogram is due 05/2015  Will get a repeat PUS and follow with her past history  I did give refill on Klonopin and Ambien but only for 2 weeks pending visit with PCP.  The PCP treats her other health issues and we feel they need to monitor this med.  Counseled on breast self exam, mammography screening, adequate intake of calcium and vitamin D, diet and exercise return annually or prn  An After Visit Summary was printed and given to the patient.

## 2014-06-24 NOTE — Patient Instructions (Signed)

## 2014-06-27 ENCOUNTER — Telehealth: Payer: Self-pay | Admitting: Nurse Practitioner

## 2014-06-27 LAB — IPS PAP TEST WITH HPV

## 2014-06-27 NOTE — Telephone Encounter (Signed)
Spoke with patient. Advised that per benefit quote received, she will be responsible to pay $271.24 when she comes in for PUS. Patient feels that this is very expensive and would like to check with provider to see if this is something that she "has to do". Advised that I would check with provider and that either I or the providers nurse will call her back.  Staff message sent to Mrs Chong Sicilian.

## 2014-06-30 NOTE — Progress Notes (Signed)
Encounter reviewed by Dr. Brook Silva.  

## 2014-07-04 ENCOUNTER — Telehealth: Payer: Self-pay

## 2014-07-04 ENCOUNTER — Encounter: Payer: Self-pay | Admitting: Nurse Practitioner

## 2014-07-04 ENCOUNTER — Ambulatory Visit (INDEPENDENT_AMBULATORY_CARE_PROVIDER_SITE_OTHER): Payer: BLUE CROSS/BLUE SHIELD | Admitting: Nurse Practitioner

## 2014-07-04 DIAGNOSIS — G4700 Insomnia, unspecified: Secondary | ICD-10-CM

## 2014-07-04 DIAGNOSIS — E0590 Thyrotoxicosis, unspecified without thyrotoxic crisis or storm: Secondary | ICD-10-CM

## 2014-07-04 DIAGNOSIS — F329 Major depressive disorder, single episode, unspecified: Secondary | ICD-10-CM

## 2014-07-04 DIAGNOSIS — F32A Depression, unspecified: Secondary | ICD-10-CM

## 2014-07-04 DIAGNOSIS — E785 Hyperlipidemia, unspecified: Secondary | ICD-10-CM

## 2014-07-04 DIAGNOSIS — K219 Gastro-esophageal reflux disease without esophagitis: Secondary | ICD-10-CM

## 2014-07-04 DIAGNOSIS — E8881 Metabolic syndrome: Secondary | ICD-10-CM

## 2014-07-04 DIAGNOSIS — I10 Essential (primary) hypertension: Secondary | ICD-10-CM

## 2014-07-04 DIAGNOSIS — F411 Generalized anxiety disorder: Secondary | ICD-10-CM

## 2014-07-04 LAB — COMPLETE METABOLIC PANEL WITH GFR
ALBUMIN: 3.6 g/dL (ref 3.5–5.2)
ALK PHOS: 83 U/L (ref 39–117)
ALT: 18 U/L (ref 0–35)
AST: 15 U/L (ref 0–37)
BUN: 18 mg/dL (ref 6–23)
CALCIUM: 9.4 mg/dL (ref 8.4–10.5)
CHLORIDE: 99 meq/L (ref 96–112)
CO2: 31 mEq/L (ref 19–32)
CREATININE: 0.78 mg/dL (ref 0.50–1.10)
GFR, Est African American: >89 mL/min
GFR, Est Non African American: 83 mL/min
GLUCOSE: 182 mg/dL — AB (ref 70–99)
Potassium: 4.1 mEq/L (ref 3.5–5.3)
Sodium: 140 mEq/L (ref 135–145)
TOTAL PROTEIN: 5.9 g/dL — AB (ref 6.0–8.3)
Total Bilirubin: 0.5 mg/dL (ref 0.2–1.2)

## 2014-07-04 MED ORDER — DEXLANSOPRAZOLE 60 MG PO CPDR: DELAYED_RELEASE_CAPSULE | ORAL | Status: DC

## 2014-07-04 MED ORDER — ESCITALOPRAM OXALATE 20 MG PO TABS: 20.0000 mg | ORAL_TABLET | Freq: Every day | ORAL | Status: DC

## 2014-07-04 MED ORDER — ZOLPIDEM TARTRATE 10 MG PO TABS: ORAL_TABLET | ORAL | Status: DC

## 2014-07-04 MED ORDER — CLONAZEPAM 0.5 MG PO TABS: ORAL_TABLET | ORAL | Status: DC

## 2014-07-04 MED ORDER — HYDROCHLOROTHIAZIDE 25 MG PO TABS: ORAL_TABLET | ORAL | Status: DC

## 2014-07-04 MED ORDER — ATENOLOL 100 MG PO TABS: 100.0000 mg | ORAL_TABLET | Freq: Every day | ORAL | Status: DC

## 2014-07-04 NOTE — Addendum Note (Signed)
Addended by: Selmer Dominion on: 07/04/2014 10:21 AM   Modules accepted: Orders

## 2014-07-04 NOTE — Telephone Encounter (Signed)
I have left message for patient to return call to office to discuss results below.  Notes Recorded by Graylon Good, CMA on 06/28/2014 at 11:04 AM 02 recall entered, appt 07/02/15 Notes Recorded by Milford Cage, FNP on 06/27/2014 at 6:03 PM pap02

## 2014-07-04 NOTE — Addendum Note (Signed)
Addended by: Selmer Dominion on: 07/04/2014 10:19 AM   Modules accepted: Orders

## 2014-07-04 NOTE — Telephone Encounter (Signed)
Spoke with patient. Advised patient of results as seen below. Patient is agreeable.  Routing to provider for final review. Patient agreeable to disposition. Will close encounter

## 2014-07-04 NOTE — Progress Notes (Signed)
Subjective:    Patient ID: [name], female    DOB: [birth year], 59 y.o.   MRN: [id number]   Patient here today for follow up of chronic medical problems.   Hypertension This is a chronic problem. The current episode started more than 1 year ago. The problem is unchanged. The problem is controlled. Pertinent negatives include no chest pain, neck pain, palpitations or shortness of breath. Risk factors for coronary artery disease include dyslipidemia, obesity and post-menopausal state. Past treatments include beta blockers and diuretics. The current treatment provides moderate improvement. Compliance problems include diet and exercise.  Hypertensive end-organ damage includes a thyroid problem.  Hyperlipidemia This is a chronic problem. The current episode started more than 1 year ago. Recent lipid tests were reviewed and are variable. Pertinent negatives include no chest pain or shortness of breath. She is currently on no antihyperlipidemic treatment. The current treatment provides mild improvement of lipids. Compliance problems include adherence to diet and adherence to exercise.  Risk factors for coronary artery disease include dyslipidemia, hypertension and obesity.  Thyroid Problem Visit type: hypothyroidism. Patient reports no palpitations. Her past medical history is significant for hyperlipidemia.  insomnia ambien 10 qhs- sleeps well- uses CPAP nightly GAD/Depression Has tried to wean off of leapro but would get side effects- takes klonopin maybe 1X per week. GERD Dexilant keeps symptoms under control- thsi is the only med that has worked for her sympytoms. Migraine imitrex as needed- has not had one since February of 2015.   Review of Systems  Constitutional: Negative.   HENT: Negative.   Respiratory: Negative for shortness of breath.   Cardiovascular: Negative for chest pain and palpitations.  Genitourinary: Negative.   Musculoskeletal: Negative for neck pain.    Neurological: Negative.   Psychiatric/Behavioral: Negative.   All other systems reviewed and are negative.      Objective:   Physical Exam  Constitutional: She is oriented to person, place, and time. She appears well-developed and well-nourished.  HENT:  Nose: Nose normal.  Mouth/Throat: Oropharynx is clear and moist.  Eyes: EOM are normal.  Neck: Trachea normal, normal range of motion and full passive range of motion without pain. Neck supple. No JVD present. Carotid bruit is not present. No thyromegaly present.  Cardiovascular: Normal rate, regular rhythm, normal heart sounds and intact distal pulses.  Exam reveals no gallop and no friction rub.   No murmur heard. Pulmonary/Chest: Effort normal and breath sounds normal.  Abdominal: Soft. Bowel sounds are normal. She exhibits no distension and no mass. There is no tenderness.  Musculoskeletal: Normal range of motion.  Lymphadenopathy:    She has no cervical adenopathy.  Neurological: She is alert and oriented to person, place, and time. She has normal reflexes.  Skin: Skin is warm and dry.  Erythematous macular lesion 2cm right forearm  Psychiatric: She has a normal mood and affect. Her behavior is normal. Judgment and thought content normal.   BP 136/80 mmHg  Pulse 61  Temp(Src) 97 F (36.1 C) (Oral)  Ht '5\' 5"'  (1.651 m)  Wt 265 lb (120.203 kg)  BMI 44.10 kg/m2  LMP 05/24/1986         Assessment & Plan:   1. Essential hypertension Do not add slat to diet - atenolol (TENORMIN) 100 MG tablet; Take 1 tablet (100 mg total) by mouth daily.  Dispense: 90 tablet; Refill: 1 - hydrochlorothiazide (HYDRODIURIL) 25 MG tablet; TAKE 1 TABLET (25 MG TOTAL) BY MOUTH DAILY.  Dispense: 90 tablet; Refill: 1 -  CMP14+EGFR  2. Gastroesophageal reflux disease without esophagitis Avoid spicy foods - dexlansoprazole (DEXILANT) 60 MG capsule; TAKE 1 CAPSULE (60 MG TOTAL) BY MOUTH 2 (TWO) TIMES DAILY.  Dispense: 180 capsule; Refill:  1  3. Hyperthyroidism Keep follo wup with Dr. Chalmers Cater  4. Metabolic syndrome Watch carbs in diet  5. Insomnia Bedtime ritual - zolpidem (AMBIEN) 10 MG tablet; TAKE 1 TABLET AT BEDTIME  Dispense: 14 tablet; Refill: 3  6. Hyperlipidemia Low fat diet - NMR, lipoprofile  7. GAD (generalized anxiety disorder) Stress management - clonazePAM (KLONOPIN) 0.5 MG tablet; TAKE 1 TABLET AS NEEDED FOR ANXIETY  Dispense: 30 tablet; Refill: 3  8. Depression - escitalopram (LEXAPRO) 20 MG tablet; Take 1 tablet (20 mg total) by mouth daily.  Dispense: 90 tablet; Refill: 1    Labs pending Health maintenance reviewed Diet and exercise encouraged Continue all meds Follow up  In 6 months   [place], FNP

## 2014-07-04 NOTE — Patient Instructions (Signed)
Fat and Cholesterol Control Diet Fat and cholesterol levels in your blood and organs are influenced by your diet. High levels of fat and cholesterol may lead to diseases of the heart, small and large blood vessels, gallbladder, liver, and pancreas. CONTROLLING FAT AND CHOLESTEROL WITH DIET Although exercise and lifestyle factors are important, your diet is key. That is because certain foods are known to raise cholesterol and others to lower it. The goal is to balance foods for their effect on cholesterol and more importantly, to replace saturated and trans fat with other types of fat, such as monounsaturated fat, polyunsaturated fat, and omega-3 fatty acids. On average, a person should consume no more than 15 to 17 g of saturated fat daily. Saturated and trans fats are considered "bad" fats, and they will raise LDL cholesterol. Saturated fats are primarily found in animal products such as meats, butter, and cream. However, that does not mean you need to give up all your favorite foods. Today, there are good tasting, low-fat, low-cholesterol substitutes for most of the things you like to eat. Choose low-fat or nonfat alternatives. Choose round or loin cuts of red meat. These types of cuts are lowest in fat and cholesterol. Chicken (without the skin), fish, veal, and ground turkey breast are great choices. Eliminate fatty meats, such as hot dogs and salami. Even shellfish have little or no saturated fat. Have a 3 oz (85 g) portion when you eat lean meat, poultry, or fish. Trans fats are also called "partially hydrogenated oils." They are oils that have been scientifically manipulated so that they are solid at room temperature resulting in a longer shelf life and improved taste and texture of foods in which they are added. Trans fats are found in stick margarine, some tub margarines, cookies, crackers, and baked goods.  When baking and cooking, oils are a great substitute for butter. The monounsaturated oils are  especially beneficial since it is believed they lower LDL and raise HDL. The oils you should avoid entirely are saturated tropical oils, such as coconut and palm.  Remember to eat a lot from food groups that are naturally free of saturated and trans fat, including fish, fruit, vegetables, beans, grains (barley, rice, couscous, bulgur wheat), and pasta (without cream sauces).  IDENTIFYING FOODS THAT LOWER FAT AND CHOLESTEROL  Soluble fiber may lower your cholesterol. This type of fiber is found in fruits such as apples, vegetables such as broccoli, potatoes, and carrots, legumes such as beans, peas, and lentils, and grains such as barley. Foods fortified with plant sterols (phytosterol) may also lower cholesterol. You should eat at least 2 g per day of these foods for a cholesterol lowering effect.  Read package labels to identify low-saturated fats, trans fat free, and low-fat foods at the supermarket. Select cheeses that have only 2 to 3 g saturated fat per ounce. Use a heart-healthy tub margarine that is free of trans fats or partially hydrogenated oil. When buying baked goods (cookies, crackers), avoid partially hydrogenated oils. Breads and muffins should be made from whole grains (whole-wheat or whole oat flour, instead of "flour" or "enriched flour"). Buy non-creamy canned soups with reduced salt and no added fats.  FOOD PREPARATION TECHNIQUES  Never deep-fry. If you must fry, either stir-fry, which uses very little fat, or use non-stick cooking sprays. When possible, broil, bake, or roast meats, and steam vegetables. Instead of putting butter or margarine on vegetables, use lemon and herbs, applesauce, and cinnamon (for squash and sweet potatoes). Use nonfat   yogurt, salsa, and low-fat dressings for salads.  LOW-SATURATED FAT / LOW-FAT FOOD SUBSTITUTES Meats / Saturated Fat (g)  Avoid: Steak, marbled (3 oz/85 g) / 11 g  Choose: Steak, lean (3 oz/85 g) / 4 g  Avoid: Hamburger (3 oz/85 g) / 7  g  Choose: Hamburger, lean (3 oz/85 g) / 5 g  Avoid: Ham (3 oz/85 g) / 6 g  Choose: Ham, lean cut (3 oz/85 g) / 2.4 g  Avoid: Chicken, with skin, dark meat (3 oz/85 g) / 4 g  Choose: Chicken, skin removed, dark meat (3 oz/85 g) / 2 g  Avoid: Chicken, with skin, light meat (3 oz/85 g) / 2.5 g  Choose: Chicken, skin removed, light meat (3 oz/85 g) / 1 g Dairy / Saturated Fat (g)  Avoid: Whole milk (1 cup) / 5 g  Choose: Low-fat milk, 2% (1 cup) / 3 g  Choose: Low-fat milk, 1% (1 cup) / 1.5 g  Choose: Skim milk (1 cup) / 0.3 g  Avoid: Hard cheese (1 oz/28 g) / 6 g  Choose: Skim milk cheese (1 oz/28 g) / 2 to 3 g  Avoid: Cottage cheese, 4% fat (1 cup) / 6.5 g  Choose: Low-fat cottage cheese, 1% fat (1 cup) / 1.5 g  Avoid: Ice cream (1 cup) / 9 g  Choose: Sherbet (1 cup) / 2.5 g  Choose: Nonfat frozen yogurt (1 cup) / 0.3 g  Choose: Frozen fruit bar / trace  Avoid: Whipped cream (1 tbs) / 3.5 g  Choose: Nondairy whipped topping (1 tbs) / 1 g Condiments / Saturated Fat (g)  Avoid: Mayonnaise (1 tbs) / 2 g  Choose: Low-fat mayonnaise (1 tbs) / 1 g  Avoid: Butter (1 tbs) / 7 g  Choose: Extra light margarine (1 tbs) / 1 g  Avoid: Coconut oil (1 tbs) / 11.8 g  Choose: Olive oil (1 tbs) / 1.8 g  Choose: Corn oil (1 tbs) / 1.7 g  Choose: Safflower oil (1 tbs) / 1.2 g  Choose: Sunflower oil (1 tbs) / 1.4 g  Choose: Soybean oil (1 tbs) / 2.4 g  Choose: Canola oil (1 tbs) / 1 g Document Released: 05/10/2005 Document Revised: 09/04/2012 Document Reviewed: 08/08/2013 [employer] Patient Information 2015 [employer]. This information is not intended to replace advice given to you by your health care provider. Make sure you discuss any questions you have with your health care provider.  

## 2014-07-04 NOTE — Telephone Encounter (Signed)
Left message to call [place] at [phone].  Notes Recorded by Graylon Good, CMA on 06/28/2014 at 11:04 AM 02 recall entered, appt 07/02/15 Notes Recorded by Milford Cage, FNP on 06/27/2014 at 6:03 PM pap02

## 2014-07-04 NOTE — Telephone Encounter (Signed)
Spoke with patient. Advised patient of results as seen below. Patient is agreeable.

## 2014-07-06 LAB — NMR LIPOPROFILE WITH LIPIDS
Cholesterol, Total: 211 mg/dL — ABNORMAL HIGH (ref 100–199)
HDL PARTICLE NUMBER: 23 umol/L — AB (ref >=30.5)
HDL Size: 9 nm — ABNORMAL LOW (ref >=9.2)
HDL-C: 33 mg/dL — ABNORMAL LOW (ref >39)
LARGE HDL: 3.4 umol/L — AB (ref >=4.8)
LARGE VLDL-P: 10.9 nmol/L — AB (ref <=2.7)
LDL CALC: 123 mg/dL — AB (ref 0–99)
LDL PARTICLE NUMBER: 2509 nmol/L — AB (ref <1000)
LDL SIZE: 19.6 nm (ref >=20.8)
LP-IR Score: 73 — ABNORMAL HIGH (ref <=45)
Small LDL Particle Number: 1962 nmol/L — ABNORMAL HIGH (ref <=527)
TRIGLYCERIDES: 276 mg/dL — AB (ref 0–149)
VLDL SIZE: 53.3 nm — AB (ref <=46.6)

## 2014-07-09 ENCOUNTER — Telehealth: Payer: Self-pay | Admitting: *Deleted

## 2014-07-09 NOTE — Telephone Encounter (Signed)
Please make appt for patient to meet with clinical pharmacist to review medical history, evaluate for qualification for PCSK9 medication and to fill out paper work.

## 2014-07-09 NOTE — Telephone Encounter (Signed)
Pt called to go over her test results, pt states she will agree to try the injectable.

## 2014-07-09 NOTE — Telephone Encounter (Signed)
i will have to send your chart to clinical pharmacist for review.

## 2014-07-10 NOTE — Telephone Encounter (Signed)
Patient scheduled to see pharmacist to discuss injection for cholesterol.

## 2014-07-17 ENCOUNTER — Encounter: Payer: Self-pay | Admitting: Pharmacist

## 2014-07-17 ENCOUNTER — Ambulatory Visit (INDEPENDENT_AMBULATORY_CARE_PROVIDER_SITE_OTHER): Payer: BLUE CROSS/BLUE SHIELD | Admitting: Pharmacist

## 2014-07-17 VITALS — BP 132/80 | HR 78 | Ht 65.0 in | Wt 264.5 lb

## 2014-07-17 DIAGNOSIS — R7309 Other abnormal glucose: Secondary | ICD-10-CM

## 2014-07-17 DIAGNOSIS — E782 Mixed hyperlipidemia: Secondary | ICD-10-CM

## 2014-07-17 MED ORDER — PRAVASTATIN SODIUM 20 MG PO TABS: 20.0000 mg | ORAL_TABLET | Freq: Every evening | ORAL | Status: DC

## 2014-07-17 NOTE — Progress Notes (Signed)
Patient ID: [name], female   DOB: 1956/03/07, 59 y.o.   MRN: [id number]  Lipid Clinic Consultation  Chief Complaint:   Chief Complaint  Patient presents with  . Hyperlipidemia     HPI:  Patient with elevated LDL-P and Triglycerides.  She has tried multiple statins and has not tolerated - atrovastatin, crestor, simvastatin and most recently pravastatin. Patient is not sure reaction she had to pravastatin was related to pravastatin or neuropathy though.  She does not have history of CAD.      Component Value Date/Time   CHOL 211 07/04/2014   TRIG 276 07/04/2014   LDL-C 123    LDL-P 2509    HDL-C 33     CHD/CHF Risk Equivalents:  metabolic syndrome / pre diabetes AHA Risk assessment:  6.8%  Primary Problem(s):  LDL or LDL-P elevated, HDL or HDL-P decreased and TG elevated  Low fat diet followed?  No -   Low carb diet followed?  No  Exercise?  No -    Assessment:  Dyslipidemia Elevated BG  Recommendations:  1.  Restart pravastatin at lower dose 20mg  1 tablet qpm.  2.  Discussed limiting high CHO containing foods. 3.  Recommend increased physical activity - goal 30 minutes daily at least 5 days per week 4.   RTC in 4 weeks to check FBG / A1c / lipids 5.  Also discussed weight loss options / medications - will try TLC first.  Time spent counseling patient:  45 minutes    Cherre Robins, PharmD, CPP

## 2014-07-18 ENCOUNTER — Telehealth: Payer: Self-pay | Admitting: Pharmacist

## 2014-07-18 ENCOUNTER — Other Ambulatory Visit: Payer: Self-pay | Admitting: Pharmacist

## 2014-07-18 DIAGNOSIS — R7309 Other abnormal glucose: Secondary | ICD-10-CM

## 2014-07-18 NOTE — Telephone Encounter (Signed)
Spoke with patient and orders placed for her to come in tomorrow to have FBG and A1c checked.

## 2014-07-19 ENCOUNTER — Other Ambulatory Visit: Payer: Self-pay | Admitting: Pharmacist

## 2014-07-19 ENCOUNTER — Telehealth: Payer: Self-pay | Admitting: Pharmacist

## 2014-07-19 ENCOUNTER — Other Ambulatory Visit (INDEPENDENT_AMBULATORY_CARE_PROVIDER_SITE_OTHER): Payer: BLUE CROSS/BLUE SHIELD

## 2014-07-19 DIAGNOSIS — E1165 Type 2 diabetes mellitus with hyperglycemia: Secondary | ICD-10-CM

## 2014-07-19 DIAGNOSIS — E782 Mixed hyperlipidemia: Secondary | ICD-10-CM

## 2014-07-19 DIAGNOSIS — R7309 Other abnormal glucose: Secondary | ICD-10-CM

## 2014-07-19 DIAGNOSIS — E785 Hyperlipidemia, unspecified: Secondary | ICD-10-CM

## 2014-07-19 LAB — LIPID PANEL
CHOLESTEROL: 196 mg/dL (ref 0–200)
HDL: 30 mg/dL — AB (ref >=46)
LDL Cholesterol: 112 mg/dL — ABNORMAL HIGH (ref 0–99)
Total CHOL/HDL Ratio: 6.5 Ratio
Triglycerides: 269 mg/dL — ABNORMAL HIGH (ref <150)
VLDL: 54 mg/dL — ABNORMAL HIGH (ref 0–40)

## 2014-07-19 LAB — POCT GLYCOSYLATED HEMOGLOBIN (HGB A1C): Hemoglobin A1C: 7.9

## 2014-07-19 LAB — GLUCOSE, POCT (MANUAL RESULT ENTRY): POC GLUCOSE: 226 mg/dL — AB (ref 70–99)

## 2014-07-19 LAB — LDL CHOLESTEROL, DIRECT: Direct LDL: 128 mg/dL — ABNORMAL HIGH

## 2014-07-19 MED ORDER — BAYER MICROLET LANCETS MISC: Status: DC

## 2014-07-19 MED ORDER — GLUCOSE BLOOD VI STRP: ORAL_STRIP | Status: DC

## 2014-07-19 MED ORDER — ONETOUCH DELICA LANCETS 33G MISC: Status: DC

## 2014-07-19 MED ORDER — LINAGLIPTIN 5 MG PO TABS: 5.0000 mg | ORAL_TABLET | Freq: Every day | ORAL | Status: DC

## 2014-07-19 NOTE — Addendum Note (Signed)
Addended by: Earlene Plater on: 07/19/2014 08:08 AM   Modules accepted: Orders

## 2014-07-19 NOTE — Addendum Note (Signed)
Addended by: Earlene Plater on: 07/19/2014 08:09 AM   Modules accepted: Orders

## 2014-07-19 NOTE — Progress Notes (Signed)
Lab only 

## 2014-07-19 NOTE — Telephone Encounter (Signed)
Rx sent in for one touch verio IG test strips and delica lancets.  Also left free meter at front desk for patient. Patient aware.

## 2014-08-01 ENCOUNTER — Telehealth: Payer: Self-pay | Admitting: Pharmacist

## 2014-08-01 MED ORDER — LINAGLIPTIN 5 MG PO TABS: 5.0000 mg | ORAL_TABLET | Freq: Every day | ORAL | Status: DC

## 2014-08-01 NOTE — Telephone Encounter (Signed)
#  28 samples of Trajenda 5mg  left at front desk.  Patient aware.

## 2014-08-27 ENCOUNTER — Other Ambulatory Visit: Payer: Self-pay

## 2014-08-29 ENCOUNTER — Ambulatory Visit (INDEPENDENT_AMBULATORY_CARE_PROVIDER_SITE_OTHER): Payer: BLUE CROSS/BLUE SHIELD | Admitting: Pharmacist

## 2014-08-29 ENCOUNTER — Encounter: Payer: Self-pay | Admitting: Pharmacist

## 2014-08-29 VITALS — BP 134/78 | HR 68 | Ht 65.0 in | Wt 265.0 lb

## 2014-08-29 DIAGNOSIS — E119 Type 2 diabetes mellitus without complications: Secondary | ICD-10-CM

## 2014-08-29 DIAGNOSIS — E785 Hyperlipidemia, unspecified: Secondary | ICD-10-CM

## 2014-08-29 DIAGNOSIS — G4700 Insomnia, unspecified: Secondary | ICD-10-CM

## 2014-08-29 MED ORDER — SUVOREXANT 20 MG PO TABS: 1.0000 | ORAL_TABLET | Freq: Every day | ORAL | Status: DC

## 2014-08-29 MED ORDER — SUVOREXANT 15 MG PO TABS: 1.0000 | ORAL_TABLET | Freq: Every day | ORAL | Status: DC

## 2014-08-29 MED ORDER — LINAGLIPTIN 5 MG PO TABS: 5.0000 mg | ORAL_TABLET | Freq: Every day | ORAL | Status: DC

## 2014-08-29 MED ORDER — ASPIRIN EC 81 MG PO TBEC: 81.0000 mg | DELAYED_RELEASE_TABLET | Freq: Every day | ORAL | Status: AC

## 2014-08-29 NOTE — Progress Notes (Signed)
Subjective:    [name] is a 59 y.o. female who presents for an evaluation of Type 2 diabetes mellitus and recheck lipids after restarting pravastatin 20mg  about 6 weeks ago.  Patient has history of intolerance to statins.  Current symptoms/problems include none   The patient was initially diagnosed with Type 2 diabetes mellitus Jul 19, 2014 with A1c of 7.9% and FBG of 226.  Known diabetic complications: none Cardiovascular risk factors: diabetes mellitus, dyslipidemia, hypertension and obesity (BMI >= 30 kg/m2) Current diabetic medications include tradgenta 5mg  1 tablet daily..  She tried metformin about 2 years ago at her endocrinologist's office (sees Dr Chalmers Cater for thyroid disorder) and was unable to tolerate - has severe diarrhea.  Eye exam current (within one year): no Weight trend: stable Prior visit with dietician: no Current diet: in general, a "healthy" diet   Current exercise: walking  Current monitoring regimen: home blood tests - once daily Home blood sugar records: fasting range: 90 to 160 Any episodes of hypoglycemia? No HBG readings:  7 day avg = 162  14 day avg = 165  30 day avg = 169  90 day avg = 173  Is She on ACE inhibitor or angiotensin II receptor blocker?  No   In reviewing medications with patient she mentions that she doesn't feel zolpidem has worked in [employer].  She usually goes to bed around 10pm but does ot fall sleep until around 2 or 3 am.  Sleeps about 5 hours per night.  The following portions of the patient's history were reviewed and updated as appropriate: allergies, current medications, past family history, past medical history and problem list.   Objective:    BP 134/78 mmHg  Pulse 68  Ht 5\' 5"  (1.651 m)  Wt 265 lb (120.203 kg)  BMI 44.10 kg/m2  LMP 05/24/1986  Lab Review GLUCOSE, BLD (mg/dL)  Date Value  07/04/2014 182*  11/14/2013 143*   CO2 (mEq/L)  Date Value  07/04/2014 31  11/14/2013 34*   BUN (mg/dL)  Date Value   07/04/2014 18  11/14/2013 16   CREAT (mg/dL)  Date Value  07/04/2014 0.78  11/14/2013 0.87    Last A1c was 7.9% (07/19/2014)  Assessment:    Diabetes Mellitus type II, under improving control.   Mixed hyperlipidemia with history of intolerance to statins but is tolerating low dose pravastatin started 6 weeks ago Insomnia   Plan:    1.  Rx changes:   Continue tradjenta 5mg  1 tablet daily  Continue pravastatin 20mg  1 tablet daily  Start ASA 81mg  1 tablet daily  Patient given trial of Belsomra 15mg  and 20mg  (rx with #10 free cards) to use in place of zolpidem.  2.  Education: Reviewed 'ABCs' of diabetes management (respective goals in parentheses):  A1C (<7), blood pressure (<130/80), and cholesterol (LDL <100). 3.  Continue with current diet changes / limiting CHO's.  ALso continue with daily walking. 4.  Orders Placed This Encounter  Procedures  . Lipid Panel w/Direct LDL:HDL Ratio    5. Follow up: 6 weeks    Cherre Robins, PharmD, CPP. CDE

## 2014-08-29 NOTE — Patient Instructions (Signed)
Diabetes and Standards of Medical Care   Diabetes is complicated. You may find that your diabetes team includes a dietitian, nurse, diabetes educator, eye doctor, and more. To help everyone know what is going on and to help you get the care you deserve, the following schedule of care was developed to help keep you on track. Below are the tests, exams, vaccines, medicines, education, and plans you will need.  Blood Glucose Goals Prior to meals = 80 - 130 Within 2 hours of the start of a meal = less than 180  HbA1c test (goal is less than 7.0% - your last value was 7.9%) This test shows how well you have controlled your glucose over the past 2 to 3 months. It is used to see if your diabetes management plan needs to be adjusted.   It is performed at least 2 times a year if you are meeting treatment goals.  It is performed 4 times a year if therapy has changed or if you are not meeting treatment goals.  Blood pressure test  This test is performed at every routine medical visit. The goal is less than 140/90 mmHg for most people, but 130/80 mmHg in some cases. Ask your health care provider about your goal.  Dental exam  Follow up with the dentist regularly.  Eye exam  If you are diagnosed with type 1 diabetes as a child, get an exam upon reaching the age of 10 years or older and have had diabetes for 3 to 5 years. Yearly eye exams are recommended after that initial eye exam.  If you are diagnosed with type 1 diabetes as an adult, get an exam within 5 years of diagnosis and then yearly.  If you are diagnosed with type 2 diabetes, get an exam as soon as possible after the diagnosis and then yearly.  Foot care exam  Visual foot exams are performed at every routine medical visit. The exams check for cuts, injuries, or other problems with the feet.  A comprehensive foot exam should be done yearly. This includes visual inspection as well as assessing foot pulses and testing for loss of  sensation.  Check your feet nightly for cuts, injuries, or other problems with your feet. Tell your health care provider if anything is not healing.  Kidney function test (urine microalbumin)  This test is performed once a year.  Type 1 diabetes: The first test is performed 5 years after diagnosis.  Type 2 diabetes: The first test is performed at the time of diagnosis.  A serum creatinine and estimated glomerular filtration rate (eGFR) test is done once a year to assess the level of chronic kidney disease (CKD), if present.  Lipid profile (cholesterol, HDL, LDL, triglycerides)  Performed every 5 years for most people.  The goal for LDL is less than 100 mg/dL. If you are at high risk, the goal is less than 70 mg/dL.  The goal for HDL is 40 mg/dL to 50 mg/dL for men and 50 mg/dL to 60 mg/dL for women. An HDL cholesterol of 60 mg/dL or higher gives some protection against heart disease.  The goal for triglycerides is less than 150 mg/dL.  Influenza vaccine, pneumococcal vaccine, and hepatitis B vaccine  The influenza vaccine is recommended yearly.  The pneumococcal vaccine is generally given once in a lifetime. However, there are some instances when another vaccination is recommended. Check with your health care provider.  The hepatitis B vaccine is also recommended for adults with diabetes.    Diabetes self-management education  Education is recommended at diagnosis and ongoing as needed.  Treatment plan  Your treatment plan is reviewed at every medical visit.  Document Released: 03/07/2009 Document Revised: 01/10/2013 Document Reviewed: 10/10/2012 ExitCare Patient Information 2014 [employer].   

## 2014-08-30 LAB — LIPID PANEL W/DIRECT LDL/HDL RATIO
Cholesterol: 179 mg/dL (ref 0–200)
Direct LDL: 111 mg/dL — ABNORMAL HIGH
HDL: 24 mg/dL — ABNORMAL LOW (ref >=46)
LDL/HDL RATIO (DIRECT LDL): 4.6 ratio
Total Chol/HDL Ratio: 7.5 Ratio
Triglycerides: 251 mg/dL — ABNORMAL HIGH (ref <150)

## 2014-09-02 ENCOUNTER — Telehealth: Payer: Self-pay | Admitting: Pharmacist

## 2014-09-02 MED ORDER — PRAVASTATIN SODIUM 40 MG PO TABS: 40.0000 mg | ORAL_TABLET | Freq: Every evening | ORAL | Status: DC

## 2014-09-02 NOTE — Telephone Encounter (Signed)
Triglycerides and LDL are better but not at goals. Patient has changed diet and diabetes control is improving.  Recommend increase pravastatin from 20mg  to 40mg  daily.  Recheck lipids in 3 months. Patient notified and agrees.

## 2014-09-30 ENCOUNTER — Telehealth: Payer: Self-pay | Admitting: Pharmacist

## 2014-09-30 MED ORDER — LINAGLIPTIN 5 MG PO TABS: 5.0000 mg | ORAL_TABLET | Freq: Every day | ORAL | Status: DC

## 2014-09-30 NOTE — Telephone Encounter (Signed)
#  28 samples of tradgenta left for patient - follow up scheduled 10/24/14

## 2014-10-22 ENCOUNTER — Other Ambulatory Visit: Payer: Self-pay | Admitting: Nurse Practitioner

## 2014-10-22 NOTE — Telephone Encounter (Signed)
Last seen 07/04/14 MMM If approved route to nurse to [facility] into [facility]

## 2014-10-22 NOTE — Telephone Encounter (Signed)
Please call in [place], and klonopin with 1 each refills

## 2014-10-24 ENCOUNTER — Ambulatory Visit (INDEPENDENT_AMBULATORY_CARE_PROVIDER_SITE_OTHER): Payer: BLUE CROSS/BLUE SHIELD | Admitting: Pharmacist

## 2014-10-24 ENCOUNTER — Encounter: Payer: Self-pay | Admitting: Pharmacist

## 2014-10-24 VITALS — BP 128/80 | HR 78 | Ht 65.0 in | Wt 263.0 lb

## 2014-10-24 DIAGNOSIS — E119 Type 2 diabetes mellitus without complications: Secondary | ICD-10-CM | POA: Diagnosis not present

## 2014-10-24 DIAGNOSIS — E782 Mixed hyperlipidemia: Secondary | ICD-10-CM | POA: Diagnosis not present

## 2014-10-24 DIAGNOSIS — E0590 Thyrotoxicosis, unspecified without thyrotoxic crisis or storm: Secondary | ICD-10-CM | POA: Diagnosis not present

## 2014-10-24 LAB — THYROID PANEL WITH TSH
FREE THYROXINE INDEX: 3.4 (ref 1.4–3.8)
T3 Uptake: 29 % (ref 22–35)
T4, Total: 11.7 ug/dL (ref 4.5–12.0)
TSH: 0.373 u[IU]/mL (ref 0.350–4.500)

## 2014-10-24 LAB — POCT GLYCOSYLATED HEMOGLOBIN (HGB A1C): HEMOGLOBIN A1C: 6.9

## 2014-10-24 LAB — COMPREHENSIVE METABOLIC PANEL
ALBUMIN: 3.4 g/dL — AB (ref 3.5–5.2)
ALK PHOS: 75 U/L (ref 39–117)
ALT: 16 U/L (ref 0–35)
AST: 14 U/L (ref 0–37)
BILIRUBIN TOTAL: 0.3 mg/dL (ref 0.2–1.2)
BUN: 16 mg/dL (ref 6–23)
CALCIUM: 8.8 mg/dL (ref 8.4–10.5)
CO2: 29 meq/L (ref 19–32)
CREATININE: 0.74 mg/dL (ref 0.50–1.10)
Chloride: 100 mEq/L (ref 96–112)
Glucose, Bld: 166 mg/dL — ABNORMAL HIGH (ref 70–99)
POTASSIUM: 3.8 meq/L (ref 3.5–5.3)
Sodium: 142 mEq/L (ref 135–145)
TOTAL PROTEIN: 5.4 g/dL — AB (ref 6.0–8.3)

## 2014-10-24 LAB — LIPID PANEL
CHOL/HDL RATIO: 6.4 ratio
Cholesterol: 141 mg/dL (ref 0–200)
HDL: 22 mg/dL — ABNORMAL LOW (ref >=46)
LDL CALC: 74 mg/dL (ref 0–99)
Triglycerides: 226 mg/dL — ABNORMAL HIGH (ref <150)
VLDL: 45 mg/dL — ABNORMAL HIGH (ref 0–40)

## 2014-10-24 LAB — LDL CHOLESTEROL, DIRECT: Direct LDL: 94 mg/dL

## 2014-10-24 MED ORDER — LINAGLIPTIN 5 MG PO TABS
5.0000 mg | ORAL_TABLET | Freq: Every day | ORAL | Status: DC
Start: 2014-10-24 — End: 2014-10-24

## 2014-10-24 MED ORDER — LINAGLIPTIN 5 MG PO TABS: 5.0000 mg | ORAL_TABLET | Freq: Every day | ORAL | Status: DC

## 2014-10-24 NOTE — Patient Instructions (Signed)
Diabetes and Standards of Medical Care   Diabetes is complicated. You may find that your diabetes team includes a dietitian, nurse, diabetes educator, eye doctor, and more. To help everyone know what is going on and to help you get the care you deserve, the following schedule of care was developed to help keep you on track. Below are the tests, exams, vaccines, medicines, education, and plans you will need.  Blood Glucose Goals Prior to meals = 80 - 130 Within 2 hours of the start of a meal = less than 180  HbA1c test (goal is less than 7.0% - your last value was 6.9%) This test shows how well you have controlled your glucose over the past 2 to 3 months. It is used to see if your diabetes management plan needs to be adjusted.   It is performed at least 2 times a year if you are meeting treatment goals.  It is performed 4 times a year if therapy has changed or if you are not meeting treatment goals.  Blood pressure test  This test is performed at every routine medical visit. The goal is less than 140/90 mmHg for most people, but 130/80 mmHg in some cases. Ask your health care provider about your goal.  Dental exam  Follow up with the dentist regularly.  Eye exam  If you are diagnosed with type 1 diabetes as a child, get an exam upon reaching the age of 61 years or older and have had diabetes for 3 to 5 years. Yearly eye exams are recommended after that initial eye exam.  If you are diagnosed with type 1 diabetes as an adult, get an exam within 5 years of diagnosis and then yearly.  If you are diagnosed with type 2 diabetes, get an exam as soon as possible after the diagnosis and then yearly.  Foot care exam  Visual foot exams are performed at every routine medical visit. The exams check for cuts, injuries, or other problems with the feet.  A comprehensive foot exam should be done yearly. This includes visual inspection as well as assessing foot pulses and testing for loss of  sensation.  Check your feet nightly for cuts, injuries, or other problems with your feet. Tell your health care provider if anything is not healing.  Kidney function test (urine microalbumin)  This test is performed once a year.  Type 1 diabetes: The first test is performed 5 years after diagnosis.  Type 2 diabetes: The first test is performed at the time of diagnosis.  A serum creatinine and estimated glomerular filtration rate (eGFR) test is done once a year to assess the level of chronic kidney disease (CKD), if present.  Lipid profile (cholesterol, HDL, LDL, triglycerides)  Performed every 5 years for most people.  The goal for LDL is less than 100 mg/dL. If you are at high risk, the goal is less than 70 mg/dL.  The goal for HDL is 40 mg/dL to 50 mg/dL for men and 50 mg/dL to 60 mg/dL for women. An HDL cholesterol of 60 mg/dL or higher gives some protection against heart disease.  The goal for triglycerides is less than 150 mg/dL.  Influenza vaccine, pneumococcal vaccine, and hepatitis B vaccine  The influenza vaccine is recommended yearly.  The pneumococcal vaccine is generally given once in a lifetime. However, there are some instances when another vaccination is recommended. Check with your health care provider.  The hepatitis B vaccine is also recommended for adults with diabetes.  Diabetes self-management education  Education is recommended at diagnosis and ongoing as needed.  Treatment plan  Your treatment plan is reviewed at every medical visit.  Document Released: 03/07/2009 Document Revised: 01/10/2013 Document Reviewed: 10/10/2012 ExitCare Patient Information 2014 [employer].   

## 2014-10-24 NOTE — Progress Notes (Signed)
Subjective:    [name] is a 59 y.o. female who presents for an evaluation of Type 2 diabetes mellitus and recheck lipids Increased pravastatin from 97m to 40 mg about 8 weeks ago.  Patient has history of intolerance to statins  But has done well with pravastatin.  Current symptoms/problems include none   The patient was initially diagnosed with Type 2 diabetes mellitus Jul 19, 2014 with A1c of 7.9% and FBG of 226. She is taking tradgenta 556m1 tablet daily. She tried metformin about 2 years ago at her endocrinologist's office (sees Dr BaChalmers Cateror thyroid disorder) and was unable to tolerate - had severe diarrhea.  Known diabetic complications: none Cardiovascular risk factors: diabetes mellitus, dyslipidemia, hypertension and obesity (BMI >= 30 kg/m2)  Eye exam current (within one year): no Weight trend: stable Prior visit with dietician: no Current diet: in general, a "healthy" diet   trying to limit to CHO.  No sodas or sweet tea.  No sweets. More carrots, broccoli and green beans. Current exercise: walking - 0.25 mile daily  Current monitoring regimen: home blood tests - once daily Home blood sugar records: fasting range: 123 to 215 Any episodes of hypoglycemia? No HBG readings:  7 day avg = 157  14 day avg = 173  30 day avg = 179  90 day avg = 174  Is She on ACE inhibitor or angiotensin II receptor blocker?  No   The following portions of the patient's history were reviewed and updated as appropriate: allergies, current medications, past family history, past medical history and problem list.   Objective:    LMP 05/24/1986  Lab Review GLUCOSE, BLD (mg/dL)  Date Value  07/04/2014 182*  11/14/2013 143*   CO2 (mEq/L)  Date Value  07/04/2014 31  11/14/2013 34*   BUN (mg/dL)  Date Value  07/04/2014 18  11/14/2013 16   CREAT (mg/dL)  Date Value  07/04/2014 0.78  11/14/2013 0.87    Previsou A1c was 7.9% (07/19/2014)  Todays's A1c was 6.9%   Assessment:     Diabetes Mellitus type II, under improving control.   Mixed hyperlipidemia with history of intolerance to statins but is tolerating current dose of pravastatin    Plan:    1.  Rx changes:   Continue tradjenta 68m61m tablet daily  Continue pravastatin 5m67mtablet daily  Discussed alternatives to tradjenta - Invokana and Trulicity which might help with weight loss.  Patient is pleased with tradjenta and that her A1c is below 7.0%.  She wants to continue to work on diet and exercise for weight loss.  2.  Education: Reviewed 'ABCs' of diabetes management (respective goals in parentheses):  A1C (<7), blood pressure (<130/80), and cholesterol (LDL <100). 3.  Continue with current diet changes / limiting CHO's.  Also continue with daily walking. 4.  Orders Placed This Encounter  Procedures  . CMP14+EGFR  . Lipid panel  . LDL cholesterol, direct  . Microalbumin / creatinine urine ratio  . Thyroid Panel With TSH  . POCT glycosylated hemoglobin (Hb A1C)    5. Follow up: 6 weeks    TammCherre RobinsarmD, CPP. CDE

## 2014-10-25 LAB — MICROALBUMIN, URINE: Microalb, Ur: 1.2 mg/dL (ref <2.0)

## 2014-11-18 ENCOUNTER — Other Ambulatory Visit: Payer: Self-pay

## 2014-11-26 ENCOUNTER — Other Ambulatory Visit: Payer: Self-pay | Admitting: Pharmacist

## 2014-12-09 ENCOUNTER — Encounter: Payer: Self-pay | Admitting: *Deleted

## 2014-12-20 ENCOUNTER — Other Ambulatory Visit: Payer: Self-pay | Admitting: Nurse Practitioner

## 2014-12-20 NOTE — Telephone Encounter (Signed)
Please call in [place] and klonopin with 1 refills each

## 2014-12-20 NOTE — Telephone Encounter (Signed)
Last filled 11/23/14, last seen 07/04/14. [facility]

## 2014-12-23 NOTE — Telephone Encounter (Signed)
[facility]ed [facility]

## 2014-12-24 ENCOUNTER — Other Ambulatory Visit: Payer: Self-pay | Admitting: *Deleted

## 2014-12-24 MED ORDER — LINAGLIPTIN 5 MG PO TABS: 5.0000 mg | ORAL_TABLET | Freq: Every day | ORAL | Status: DC

## 2015-01-17 ENCOUNTER — Ambulatory Visit: Payer: Self-pay | Admitting: Nurse Practitioner

## 2015-01-30 ENCOUNTER — Ambulatory Visit (INDEPENDENT_AMBULATORY_CARE_PROVIDER_SITE_OTHER): Payer: BLUE CROSS/BLUE SHIELD | Admitting: Nurse Practitioner

## 2015-01-30 ENCOUNTER — Encounter: Payer: Self-pay | Admitting: Nurse Practitioner

## 2015-01-30 VITALS — BP 126/73 | HR 72 | Temp 98.1°F | Ht 65.0 in | Wt 268.0 lb

## 2015-01-30 DIAGNOSIS — F329 Major depressive disorder, single episode, unspecified: Secondary | ICD-10-CM

## 2015-01-30 DIAGNOSIS — F411 Generalized anxiety disorder: Secondary | ICD-10-CM | POA: Diagnosis not present

## 2015-01-30 DIAGNOSIS — I10 Essential (primary) hypertension: Secondary | ICD-10-CM

## 2015-01-30 DIAGNOSIS — E785 Hyperlipidemia, unspecified: Secondary | ICD-10-CM

## 2015-01-30 DIAGNOSIS — G4700 Insomnia, unspecified: Secondary | ICD-10-CM | POA: Diagnosis not present

## 2015-01-30 DIAGNOSIS — K219 Gastro-esophageal reflux disease without esophagitis: Secondary | ICD-10-CM | POA: Diagnosis not present

## 2015-01-30 DIAGNOSIS — K589 Irritable bowel syndrome without diarrhea: Secondary | ICD-10-CM

## 2015-01-30 DIAGNOSIS — E119 Type 2 diabetes mellitus without complications: Secondary | ICD-10-CM | POA: Diagnosis not present

## 2015-01-30 DIAGNOSIS — F32A Depression, unspecified: Secondary | ICD-10-CM

## 2015-01-30 LAB — COMPLETE METABOLIC PANEL WITH GFR
ALBUMIN: 3.9 g/dL (ref 3.6–5.1)
ALK PHOS: 80 U/L (ref 33–130)
ALT: 18 U/L (ref 6–29)
AST: 16 U/L (ref 10–35)
BUN: 15 mg/dL (ref 7–25)
CO2: 30 mmol/L (ref 20–31)
Calcium: 9.1 mg/dL (ref 8.6–10.4)
Chloride: 100 mmol/L (ref 98–110)
Creat: 0.75 mg/dL (ref 0.50–0.99)
GFR, EST NON AFRICAN AMERICAN: 87 mL/min (ref >=60)
GFR, Est African American: >89 mL/min (ref >=60)
GLUCOSE: 199 mg/dL — AB (ref 70–99)
POTASSIUM: 4.1 mmol/L (ref 3.5–5.3)
SODIUM: 144 mmol/L (ref 135–146)
TOTAL PROTEIN: 5.9 g/dL — AB (ref 6.1–8.1)
Total Bilirubin: 0.4 mg/dL (ref 0.2–1.2)

## 2015-01-30 LAB — POCT GLYCOSYLATED HEMOGLOBIN (HGB A1C): Hemoglobin A1C: 7.6

## 2015-01-30 MED ORDER — ESCITALOPRAM OXALATE 20 MG PO TABS: 20.0000 mg | ORAL_TABLET | Freq: Every day | ORAL | Status: DC

## 2015-01-30 MED ORDER — ZOLPIDEM TARTRATE 10 MG PO TABS: 10.0000 mg | ORAL_TABLET | Freq: Every day | ORAL | Status: DC

## 2015-01-30 MED ORDER — HYDROCHLOROTHIAZIDE 25 MG PO TABS: ORAL_TABLET | ORAL | Status: DC

## 2015-01-30 MED ORDER — CLONAZEPAM 0.5 MG PO TABS: ORAL_TABLET | ORAL | Status: DC

## 2015-01-30 MED ORDER — DEXLANSOPRAZOLE 60 MG PO CPDR: DELAYED_RELEASE_CAPSULE | ORAL | Status: DC

## 2015-01-30 MED ORDER — ATENOLOL 100 MG PO TABS: 100.0000 mg | ORAL_TABLET | Freq: Every day | ORAL | Status: DC

## 2015-01-30 MED ORDER — LINAGLIPTIN 5 MG PO TABS: 5.0000 mg | ORAL_TABLET | Freq: Every day | ORAL | Status: DC

## 2015-01-30 MED ORDER — PRAVASTATIN SODIUM 40 MG PO TABS: ORAL_TABLET | ORAL | Status: DC

## 2015-01-30 NOTE — Patient Instructions (Signed)

## 2015-01-30 NOTE — Progress Notes (Signed)
Subjective:    Patient ID: [name], female    DOB: [birth year], 59 y.o.   MRN: [id number]   Patient here today for follow up of chronic medical problems.   Hypertension This is a chronic problem. The current episode started more than 1 year ago. The problem is unchanged. The problem is controlled. Pertinent negatives include no chest pain, neck pain, palpitations or shortness of breath. Risk factors for coronary artery disease include dyslipidemia, obesity and post-menopausal state. Past treatments include beta blockers and diuretics. The current treatment provides moderate improvement. Compliance problems include diet and exercise.  Hypertensive end-organ damage includes a thyroid problem.  Hyperlipidemia This is a chronic problem. The current episode started more than 1 year ago. Recent lipid tests were reviewed and are variable. Pertinent negatives include no chest pain or shortness of breath. She is currently on no antihyperlipidemic treatment. The current treatment provides mild improvement of lipids. Compliance problems include adherence to diet and adherence to exercise.  Risk factors for coronary artery disease include dyslipidemia, hypertension and obesity.  Thyroid Problem Visit type: hypothyroidism. Patient reports no palpitations. Her past medical history is significant for hyperlipidemia.  insomnia ambien 10 qhs- sleeps well- uses CPAP nightly GAD/Depression Has tried to wean off of leapro but would get side effects- takes klonopin maybe 1X per week. GERD Dexilant keeps symptoms under control- thsi is the only med that has worked for her sympytoms. Migraine imitrex as needed- has not had one since February of 2015.   Review of Systems  Constitutional: Negative.   HENT: Negative.   Respiratory: Negative for shortness of breath.   Cardiovascular: Negative for chest pain and palpitations.  Genitourinary: Negative.   Musculoskeletal: Negative for neck pain.    Neurological: Negative.   Psychiatric/Behavioral: Negative.   All other systems reviewed and are negative.      Objective:   Physical Exam  Constitutional: She is oriented to person, place, and time. She appears well-developed and well-nourished.  HENT:  Nose: Nose normal.  Mouth/Throat: Oropharynx is clear and moist.  Eyes: EOM are normal.  Neck: Trachea normal, normal range of motion and full passive range of motion without pain. Neck supple. No JVD present. Carotid bruit is not present. No thyromegaly present.  Cardiovascular: Normal rate, regular rhythm, normal heart sounds and intact distal pulses.  Exam reveals no gallop and no friction rub.   No murmur heard. Pulmonary/Chest: Effort normal and breath sounds normal.  Abdominal: Soft. Bowel sounds are normal. She exhibits no distension and no mass. There is no tenderness.  Musculoskeletal: Normal range of motion.  Lymphadenopathy:    She has no cervical adenopathy.  Neurological: She is alert and oriented to person, place, and time. She has normal reflexes.  Skin: Skin is warm and dry.  Erythematous macular lesion 2cm right forearm  Psychiatric: She has a normal mood and affect. Her behavior is normal. Judgment and thought content normal.   BP 126/73 mmHg  Pulse 72  Temp(Src) 98.1 F (36.7 C) (Oral)  Ht 5\' 5"  (1.651 m)  Wt 268 lb (121.564 kg)  BMI 44.60 kg/m2  LMP 05/24/1986   Results for orders placed or performed in visit on 01/30/15  POCT glycosylated hemoglobin (Hb A1C)  Result Value Ref Range   Hemoglobin A1C 7.6           Assessment & Plan:  1. Type 2 diabetes mellitus without complication Low carb diet - POCT glycosylated hemoglobin (Hb A1C) - linagliptin (TRADJENTA) 5 MG TABS  tablet; Take 1 tablet (5 mg total) by mouth daily.  Dispense: 90 tablet; Refill: 0  2. Hyperlipidemia Low fat diet - Lipid Panel w/Direct LDL:HDL Ratio - pravastatin (PRAVACHOL) 40 MG tablet; TAKE 1 TABLET (40 MG TOTAL) BY  MOUTH EVERY EVENING.  Dispense: 90 tablet; Refill: 1  3. Essential hypertension Do not add salt to diet - COMPLETE METABOLIC PANEL WITH GFR - atenolol (TENORMIN) 100 MG tablet; Take 1 tablet (100 mg total) by mouth daily.  Dispense: 90 tablet; Refill: 1 - hydrochlorothiazide (HYDRODIURIL) 25 MG tablet; TAKE 1 TABLET (25 MG TOTAL) BY MOUTH DAILY.  Dispense: 90 tablet; Refill: 1  4. Gastroesophageal reflux disease without esophagitis Avoid spicy foods Do not eat 2 hours prior to bedtime - dexlansoprazole (DEXILANT) 60 MG capsule; TAKE 1 CAPSULE (60 MG TOTAL) BY MOUTH 2 (TWO) TIMES DAILY.  Dispense: 180 capsule; Refill: 1  5. IBS Watch diet  6. GAD (generalized anxiety disorder) Stress management - clonazePAM (KLONOPIN) 0.5 MG tablet; TAKE 1 TABLET EVERY DAY AS NEEDED FOR ANXIETY  Dispense: 60 tablet; Refill: 1  7. Depression - escitalopram (LEXAPRO) 20 MG tablet; Take 1 tablet (20 mg total) by mouth daily.  Dispense: 90 tablet; Refill: 1  8. Insomnia Bedtime ritual - zolpidem (AMBIEN) 10 MG tablet; Take 1 tablet (10 mg total) by mouth at bedtime.  Dispense: 30 tablet; Refill: 1  9. Severe obesity (BMI >= 40) Discussed diet and exercise for person with BMI >25 Will recheck weight in 3-6 months     Labs pending Health maintenance reviewed Diet and exercise encouraged Continue all meds Follow up  In 3 month   [place], FNP

## 2015-01-31 LAB — LIPID PANEL W/DIRECT LDL/HDL RATIO
CHOL/HDL RATIO: 5.5 ratio — AB (ref <=5.0)
Cholesterol: 169 mg/dL (ref 125–200)
Direct LDL: 113 mg/dL (ref <130)
HDL: 31 mg/dL — AB (ref >=46)
LDL/HDL RATIO (DIRECT LDL): 3.6 ratio
Triglycerides: 172 mg/dL — ABNORMAL HIGH (ref <150)

## 2015-04-26 ENCOUNTER — Other Ambulatory Visit: Payer: Self-pay | Admitting: Nurse Practitioner

## 2015-04-28 NOTE — Telephone Encounter (Signed)
[facility] 

## 2015-04-28 NOTE — Telephone Encounter (Signed)
Last seen 01/30/15  MMM If approved route to nurse to [facility]

## 2015-04-28 NOTE — Telephone Encounter (Signed)
Please call in ambien with 1 refills 

## 2015-05-02 ENCOUNTER — Encounter: Payer: Self-pay | Admitting: Internal Medicine

## 2015-05-13 ENCOUNTER — Other Ambulatory Visit: Payer: Self-pay | Admitting: Nurse Practitioner

## 2015-05-13 ENCOUNTER — Encounter: Payer: Self-pay | Admitting: Nurse Practitioner

## 2015-05-13 ENCOUNTER — Ambulatory Visit (INDEPENDENT_AMBULATORY_CARE_PROVIDER_SITE_OTHER): Payer: BLUE CROSS/BLUE SHIELD | Admitting: Nurse Practitioner

## 2015-05-13 VITALS — BP 132/84 | HR 81 | Temp 98.6°F | Ht 65.0 in | Wt 268.0 lb

## 2015-05-13 DIAGNOSIS — F411 Generalized anxiety disorder: Secondary | ICD-10-CM

## 2015-05-13 DIAGNOSIS — I10 Essential (primary) hypertension: Secondary | ICD-10-CM

## 2015-05-13 DIAGNOSIS — Z1159 Encounter for screening for other viral diseases: Secondary | ICD-10-CM

## 2015-05-13 DIAGNOSIS — E119 Type 2 diabetes mellitus without complications: Secondary | ICD-10-CM | POA: Diagnosis not present

## 2015-05-13 DIAGNOSIS — F329 Major depressive disorder, single episode, unspecified: Secondary | ICD-10-CM

## 2015-05-13 DIAGNOSIS — K219 Gastro-esophageal reflux disease without esophagitis: Secondary | ICD-10-CM | POA: Diagnosis not present

## 2015-05-13 DIAGNOSIS — E785 Hyperlipidemia, unspecified: Secondary | ICD-10-CM | POA: Diagnosis not present

## 2015-05-13 DIAGNOSIS — F32A Depression, unspecified: Secondary | ICD-10-CM

## 2015-05-13 DIAGNOSIS — E032 Hypothyroidism due to medicaments and other exogenous substances: Secondary | ICD-10-CM

## 2015-05-13 DIAGNOSIS — G4700 Insomnia, unspecified: Secondary | ICD-10-CM | POA: Diagnosis not present

## 2015-05-13 LAB — POCT GLYCOSYLATED HEMOGLOBIN (HGB A1C): Hemoglobin A1C: 8.2

## 2015-05-13 MED ORDER — ZOLPIDEM TARTRATE 10 MG PO TABS: 10.0000 mg | ORAL_TABLET | Freq: Every day | ORAL | Status: DC

## 2015-05-13 MED ORDER — HYDROCHLOROTHIAZIDE 25 MG PO TABS: ORAL_TABLET | ORAL | Status: DC

## 2015-05-13 MED ORDER — ESCITALOPRAM OXALATE 20 MG PO TABS: 20.0000 mg | ORAL_TABLET | Freq: Every day | ORAL | Status: DC

## 2015-05-13 MED ORDER — DEXLANSOPRAZOLE 60 MG PO CPDR: DELAYED_RELEASE_CAPSULE | ORAL | Status: DC

## 2015-05-13 MED ORDER — PRAVASTATIN SODIUM 40 MG PO TABS: ORAL_TABLET | ORAL | Status: DC

## 2015-05-13 MED ORDER — ATENOLOL 100 MG PO TABS: 100.0000 mg | ORAL_TABLET | Freq: Every day | ORAL | Status: DC

## 2015-05-13 MED ORDER — LINAGLIPTIN 5 MG PO TABS
5.0000 mg | ORAL_TABLET | Freq: Every day | ORAL | Status: DC
Start: 2015-05-13 — End: 2015-08-29

## 2015-05-13 NOTE — Addendum Note (Signed)
Addended by: Selmer Dominion on: 05/13/2015 05:14 PM   Modules accepted: Orders

## 2015-05-13 NOTE — Patient Instructions (Signed)

## 2015-05-13 NOTE — Addendum Note (Signed)
Addended by: Selmer Dominion on: 05/13/2015 05:17 PM   Modules accepted: Orders

## 2015-05-13 NOTE — Progress Notes (Signed)
Subjective:    Patient ID: [name], female    DOB: October 26, 1955, 59 y.o.   MRN: [id number]   Patient here today for follow up of chronic medical problems.   Hypertension This is a chronic problem. The current episode started more than 1 year ago. The problem is unchanged. The problem is controlled. Pertinent negatives include no chest pain, neck pain, palpitations or shortness of breath. Risk factors for coronary artery disease include dyslipidemia, obesity and post-menopausal state. Past treatments include beta blockers and diuretics. The current treatment provides moderate improvement. Compliance problems include diet and exercise.  Hypertensive end-organ damage includes a thyroid problem.  Hyperlipidemia This is a chronic problem. The current episode started more than 1 year ago. Recent lipid tests were reviewed and are variable. Pertinent negatives include no chest pain or shortness of breath. She is currently on no antihyperlipidemic treatment. The current treatment provides mild improvement of lipids. Compliance problems include adherence to diet and adherence to exercise.  Risk factors for coronary artery disease include dyslipidemia, hypertension and obesity.  Thyroid Problem Visit type: hypothyroidism. Patient reports no palpitations. Her past medical history is significant for hyperlipidemia.  insomnia ambien 10 qhs- sleeps well- uses CPAP nightly GAD/Depression Has tried to wean off of leapro but would get side effects- takes klonopin maybe 1X per week. GERD Dexilant keeps symptoms under control- thsi is the only med that has worked for her sympytoms. Migraine imitrex as needed- has not had one since February of 2015.   Review of Systems  Constitutional: Negative.   HENT: Negative.   Respiratory: Negative for shortness of breath.   Cardiovascular: Negative for chest pain and palpitations.  Genitourinary: Negative.   Musculoskeletal: Negative for neck pain.    Neurological: Negative.   Psychiatric/Behavioral: Negative.   All other systems reviewed and are negative.      Objective:   Physical Exam  Constitutional: She is oriented to person, place, and time. She appears well-developed and well-nourished.  HENT:  Nose: Nose normal.  Mouth/Throat: Oropharynx is clear and moist.  Eyes: EOM are normal.  Neck: Trachea normal, normal range of motion and full passive range of motion without pain. Neck supple. No JVD present. Carotid bruit is not present. No thyromegaly present.  Cardiovascular: Normal rate, regular rhythm, normal heart sounds and intact distal pulses.  Exam reveals no gallop and no friction rub.   No murmur heard. Pulmonary/Chest: Effort normal and breath sounds normal.  Abdominal: Soft. Bowel sounds are normal. She exhibits no distension and no mass. There is no tenderness.  Musculoskeletal: Normal range of motion.  Lymphadenopathy:    She has no cervical adenopathy.  Neurological: She is alert and oriented to person, place, and time. She has normal reflexes.  Skin: Skin is warm and dry.  Erythematous macular lesion 2cm right forearm  Psychiatric: She has a normal mood and affect. Her behavior is normal. Judgment and thought content normal.    BP 132/84 mmHg  Pulse 81  Temp(Src) 98.6 F (37 C) (Oral)  Ht 5\' 5"  (1.651 m)  Wt 268 lb (121.564 kg)  BMI 44.60 kg/m2  LMP 05/24/1986   Results for orders placed or performed in visit on 05/13/15  POCT glycosylated hemoglobin (Hb A1C)  Result Value Ref Range   Hemoglobin A1C 8.2              Assessment & Plan:  1. Type 2 diabetes mellitus without complication, without long-term current use of insulin (HCC) stricter carb counting If  no better at next visit will need to see clinical pharmacist about injectable meds - POCT glycosylated hemoglobin (Hb A1C) - Lipid panel - COMPLETE METABOLIC PANEL WITH GFR - linagliptin (TRADJENTA) 5 MG TABS tablet; Take 1 tablet (5 mg  total) by mouth daily.  Dispense: 90 tablet; Refill: 0  2. Hyperlipidemia Low fat diet - Lipid panel - COMPLETE METABOLIC PANEL WITH GFR - pravastatin (PRAVACHOL) 40 MG tablet; TAKE 1 TABLET (40 MG TOTAL) BY MOUTH EVERY EVENING.  Dispense: 90 tablet; Refill: 1  3. Essential hypertension Do not add slat to diet - Lipid panel - COMPLETE METABOLIC PANEL WITH GFR - hydrochlorothiazide (HYDRODIURIL) 25 MG tablet; TAKE 1 TABLET (25 MG TOTAL) BY MOUTH DAILY.  Dispense: 90 tablet; Refill: 1 - atenolol (TENORMIN) 100 MG tablet; Take 1 tablet (100 mg total) by mouth daily.  Dispense: 90 tablet; Refill: 1  4. Gastroesophageal reflux disease without esophagitis Avoid spicy foods Do not eat 2 hours prior to bedtime - dexlansoprazole (DEXILANT) 60 MG capsule; TAKE 1 CAPSULE (60 MG TOTAL) BY MOUTH 2 (TWO) TIMES DAILY.  Dispense: 180 capsule; Refill: 1  5. Hypothyroidism due to medication Keep follow  Up with Dr. Chalmers Cater  6. GAD (generalized anxiety disorder) Stress mangement  7. Depression - escitalopram (LEXAPRO) 20 MG tablet; Take 1 tablet (20 mg total) by mouth daily.  Dispense: 90 tablet; Refill: 1  8. Insomnia Bedtime ritual - zolpidem (AMBIEN) 10 MG tablet; Take 1 tablet (10 mg total) by mouth at bedtime.  Dispense: 30 tablet; Refill: 2  9. Morbid obesity, unspecified obesity type ([place])  10. Need for hepatitis C screening test - Hepatitis C Antibody    Labs pending Health maintenance reviewed Diet and exercise encouraged Continue all meds Follow up  In 3 month   [place], FNP

## 2015-05-14 LAB — COMPLETE METABOLIC PANEL WITH GFR
ALT: 15 U/L (ref 6–29)
AST: 16 U/L (ref 10–35)
Albumin: 3.8 g/dL (ref 3.6–5.1)
Alkaline Phosphatase: 81 U/L (ref 33–130)
BUN: 14 mg/dL (ref 7–25)
CALCIUM: 9.4 mg/dL (ref 8.6–10.4)
CHLORIDE: 97 mmol/L — AB (ref 98–110)
CO2: 32 mmol/L — AB (ref 20–31)
Creat: 0.7 mg/dL (ref 0.50–0.99)
GFR, Est African American: >89 mL/min (ref >=60)
GFR, Est Non African American: >89 mL/min (ref >=60)
Glucose, Bld: 147 mg/dL — ABNORMAL HIGH (ref 70–99)
POTASSIUM: 3.8 mmol/L (ref 3.5–5.3)
SODIUM: 138 mmol/L (ref 135–146)
Total Bilirubin: 0.5 mg/dL (ref 0.2–1.2)
Total Protein: 5.9 g/dL — ABNORMAL LOW (ref 6.1–8.1)

## 2015-05-14 LAB — LIPID PANEL
CHOL/HDL RATIO: 6.6 ratio — AB (ref <=5.0)
CHOLESTEROL: 185 mg/dL (ref 125–200)
HDL: 28 mg/dL — ABNORMAL LOW (ref >=46)
LDL CALC: 108 mg/dL (ref <130)
Triglycerides: 247 mg/dL — ABNORMAL HIGH (ref <150)
VLDL: 49 mg/dL — AB (ref <30)

## 2015-05-15 LAB — HEPATITIS C ANTIBODY: HCV AB: NEGATIVE

## 2015-06-05 LAB — HM MAMMOGRAPHY

## 2015-06-06 ENCOUNTER — Other Ambulatory Visit: Payer: Self-pay | Admitting: Nurse Practitioner

## 2015-06-06 ENCOUNTER — Encounter: Payer: Self-pay | Admitting: *Deleted

## 2015-06-09 NOTE — Telephone Encounter (Signed)
Last seen and filled 05/13/15. Call in at [facility]

## 2015-06-09 NOTE — Telephone Encounter (Signed)
rx called into pharmacy

## 2015-06-09 NOTE — Telephone Encounter (Signed)
Please call in klonopin with 1 refills 

## 2015-06-17 ENCOUNTER — Other Ambulatory Visit: Payer: Self-pay | Admitting: Nurse Practitioner

## 2015-06-18 NOTE — Telephone Encounter (Signed)
Last seen 05/13/15  MMM If approved route to nurse to call into [facility]

## 2015-06-19 NOTE — Telephone Encounter (Signed)
rx called into pharmacy and pt is aware. 

## 2015-06-19 NOTE — Telephone Encounter (Signed)
Please call in ambien with 1 refills 

## 2015-07-02 ENCOUNTER — Ambulatory Visit: Payer: BLUE CROSS/BLUE SHIELD | Admitting: Nurse Practitioner

## 2015-07-09 ENCOUNTER — Ambulatory Visit (INDEPENDENT_AMBULATORY_CARE_PROVIDER_SITE_OTHER): Payer: BLUE CROSS/BLUE SHIELD | Admitting: Nurse Practitioner

## 2015-07-09 ENCOUNTER — Encounter: Payer: Self-pay | Admitting: Nurse Practitioner

## 2015-07-09 VITALS — BP 136/88 | HR 72 | Ht 65.0 in | Wt 283.0 lb

## 2015-07-09 DIAGNOSIS — Z01419 Encounter for gynecological examination (general) (routine) without abnormal findings: Secondary | ICD-10-CM | POA: Diagnosis not present

## 2015-07-09 DIAGNOSIS — R8290 Unspecified abnormal findings in urine: Secondary | ICD-10-CM | POA: Diagnosis not present

## 2015-07-09 DIAGNOSIS — Z0000 Encounter for general adult medical examination without abnormal findings: Secondary | ICD-10-CM

## 2015-07-09 LAB — POCT URINALYSIS DIPSTICK
BILIRUBIN UA: NEGATIVE
GLUCOSE UA: 250
Ketones, UA: NEGATIVE
NITRITE UA: NEGATIVE
Protein, UA: NEGATIVE
RBC UA: NEGATIVE
Urobilinogen, UA: NEGATIVE
pH, UA: 6

## 2015-07-09 NOTE — Patient Instructions (Addendum)

## 2015-07-09 NOTE — Progress Notes (Signed)
Patient ID: [name], female   DOB: [birth year], 60 y.o.   MRN: [id number] 60 y.o. [id number] Married  Caucasian Fe here for annual exam.  She remains under the care of PCP and endocrinologist for her DM, HTN, and sleep apnea.  She has done well this past year.  Husband is now retired.  Patient's last menstrual period was 05/24/1986 (approximate).          Sexually active: No.  The current method of family planning is status post hysterectomy.    Exercising: Yes.    walking in neighborhood Smoker:  no  Health Maintenance: Pap: 06/24/14, Negative with neg HR HPV MMG:06/05/15, Bi-Rads 2: Benign findings Colonoscopy: 09/08/11, normal, repeat in 10 years BMD: 08/16/12, -0.1/-0.3/-0.5 TDaP: 02/22/2012 Shingles: followed by PCP Pneumonia: 2013 Hep C 05/13/15, to discuss HIV with PCP in March Labs: PCP and Dr. Chalmers Cater takes care of all blood work  Urine:    reports that she has never smoked. She has never used smokeless tobacco. She reports that she does not drink alcohol or use illicit drugs.  Past Medical History  Diagnosis Date  . Anxiety   . Arthritis 2015    spinal stenosis - Dr. Nelva Bush.  Epidural steroids  . Skin cancer     basal cell and 1 squamous cell on face and neck  . Depression   . GERD (gastroesophageal reflux disease)   . Hypertension   . Hyperlipidemia     diet controlled  . PPD positive, treated 1995    6 months INH  . Ulcer 1982    gastric  . Hyperplastic colonic polyp 2003 & 2008    #3 polyps first, 1 second time  . IBS (irritable bowel syndrome)   . OSA (obstructive sleep apnea) 07/25/2012    C- pap  . Migraines   . Hiatal hernia   . Exposure to TB     Treated x 6 months  . Basal cell carcinoma of face 2008    Left  . Thyroid disease 2011    hyperthyroid and treated with radio active iodine - now hypothyroid  . Abnormal Pap smear of cervix 1988    prior to hysterectomy  . Diabetes mellitus without complication [facility])     Past Surgical History   Procedure Laterality Date  . Mohs surgery  2006    left side of face  . Polypectomy  colon  . Colonoscopy    . Thyroid surgery  2011    radioactive  . Vaginal hysterectomy  1988    DUB and endometriosis    Current Outpatient Prescriptions  Medication Sig Dispense Refill  . aspirin EC 81 MG tablet Take 1 tablet (81 mg total) by mouth daily.    Marland Kitchen atenolol (TENORMIN) 100 MG tablet Take 1 tablet (100 mg total) by mouth daily. 90 tablet 1  . Calcium Carbonate-Vitamin D (CALCIUM 600 + D PO) Take 1 tablet by mouth daily.     . clonazePAM (KLONOPIN) 0.5 MG tablet TAKE 1 TABLET DAILY AS NEEDED FOR ANXIETY 60 tablet 1  . dexlansoprazole (DEXILANT) 60 MG capsule TAKE 1 CAPSULE (60 MG TOTAL) BY MOUTH 2 (TWO) TIMES DAILY. 180 capsule 1  . escitalopram (LEXAPRO) 20 MG tablet Take 1 tablet (20 mg total) by mouth daily. 90 tablet 1  . glucose blood (ONETOUCH VERIO) test strip Use to check BG 1 to 2 times daily 100 each 3  . hydrochlorothiazide (HYDRODIURIL) 25 MG tablet TAKE 1 TABLET (25 MG TOTAL) BY MOUTH DAILY.  90 tablet 1  . levothyroxine (SYNTHROID, LEVOTHROID) 175 MCG tablet Take 1 tablet by mouth daily. Except Sunday.  Takes 200 mg tab on Sunday.  3  . levothyroxine (SYNTHROID, LEVOTHROID) 200 MCG tablet Take 200 mcg by mouth. Take 1 pill on Sunday    . linagliptin (TRADJENTA) 5 MG TABS tablet Take 1 tablet (5 mg total) by mouth daily. 90 tablet 0  . Multiple Vitamins-Minerals (MULTI FOR HER 50+) CAPS Take 1 capsule by mouth daily.    [name] DELICA LANCETS [id number] MISC Use to check BG 1 to 2 times daily 100 each 3  . pravastatin (PRAVACHOL) 40 MG tablet TAKE 1 TABLET (40 MG TOTAL) BY MOUTH EVERY EVENING. 90 tablet 1  . zolpidem (AMBIEN) 10 MG tablet TAKE 1 TABLET AT BEDTIME 30 tablet 1   No current facility-administered medications for this visit.    Family History  Problem Relation Age of Onset  . Adopted: Yes  . Lung cancer Mother   . Lung cancer Maternal Aunt   . Stroke Maternal  Grandfather   . Diabetes Maternal Grandfather     ROS:  Pertinent items are noted in HPI.  Otherwise, a comprehensive ROS was negative.  Exam:   BP 136/88 mmHg  Pulse 72  Ht 5\' 5"  (1.651 m)  Wt 283 lb (128.368 kg)  BMI 47.09 kg/m2  LMP 05/24/1986 (Approximate) Height: 5\' 5"  (165.1 cm) Ht Readings from Last 3 Encounters:  07/09/15 5\' 5"  (1.651 m)  05/13/15 5\' 5"  (1.651 m)  01/30/15 5\' 5"  (1.651 m)    General appearance: alert, cooperative and appears stated age Head: Normocephalic, without obvious abnormality, atraumatic Neck: no adenopathy, supple, symmetrical, trachea midline and thyroid normal to inspection and palpation Lungs: clear to auscultation bilaterally Breasts: normal appearance, no masses or tenderness Heart: regular rate and rhythm Abdomen: soft, non-tender; no masses,  no organomegaly Extremities: extremities normal, atraumatic, no cyanosis or edema Skin: Skin color, texture, turgor normal. No rashes or lesions Lymph nodes: Cervical, supraclavicular, and axillary nodes normal. No abnormal inguinal nodes palpated Neurologic: Grossly normal   Pelvic: External genitalia:  no lesions              Urethra:  normal appearing urethra with no masses, tenderness or lesions              Bartholin's and Skene's: normal                 Vagina: normal appearing vagina with normal color and discharge, no lesions              Cervix: absent              Pap taken: No. Bimanual Exam:  Uterus:  uterus absent              Adnexa: no mass, fullness, tenderness               Rectovaginal: Confirms               Anus:  normal sphincter tone, no lesions  Chaperone present: no  A:  Well Woman with normal exam  S/P TVH secondary to DUB and ?endometriosis 11/1986 History of right paraovarian cyst vs. solid mass in the past - last PUS 01/2006, MRI abdomen 02/19/06 and consult with Dr. Clarene Essex 03/01/2006 - see paper chart. History of Anxiety and depression on  med's History of HTN. Hypothyroid, morbid obesity, sleep apnea, colon ployps Remote history of CIN II 1988, normal since -  pap every 3 yrs  R/O UTI   P:   Reviewed health and wellness pertinent to exam  Pap smear as above  Mammogram is due 05/2016  Will follow with urine results  Counseled on breast self exam, mammography screening, adequate intake of calcium and vitamin D, diet and exercise, Kegel's exercises return annually or prn  An After Visit Summary was printed and given to the patient.

## 2015-07-10 LAB — URINE CULTURE: Colony Count: 85000

## 2015-07-13 NOTE — Progress Notes (Signed)
Encounter reviewed by Dr. Aundria Rud.  Patient to be followed clinically for her ovarian cysts. No further intervention was recommended.

## 2015-07-29 ENCOUNTER — Other Ambulatory Visit: Payer: Self-pay | Admitting: Nurse Practitioner

## 2015-07-30 NOTE — Telephone Encounter (Signed)
Last filled 07/08/15, last seen 05/13/15. [facility]

## 2015-07-31 NOTE — Telephone Encounter (Signed)
rx called in

## 2015-07-31 NOTE — Telephone Encounter (Signed)
Please call in klonopin with 1 refills 

## 2015-08-04 ENCOUNTER — Other Ambulatory Visit: Payer: Self-pay | Admitting: Nurse Practitioner

## 2015-08-11 ENCOUNTER — Ambulatory Visit: Payer: BLUE CROSS/BLUE SHIELD | Admitting: Nurse Practitioner

## 2015-08-17 ENCOUNTER — Other Ambulatory Visit: Payer: Self-pay | Admitting: Nurse Practitioner

## 2015-08-18 ENCOUNTER — Other Ambulatory Visit: Payer: Self-pay | Admitting: Nurse Practitioner

## 2015-08-18 NOTE — Telephone Encounter (Signed)
Last filled 07/20/15, last seen 05/13/15. [facility]

## 2015-08-18 NOTE — Telephone Encounter (Signed)
Last seen 05/13/15  MMM If approved route to nurse to call into [facility]

## 2015-08-19 MED ORDER — ZOLPIDEM TARTRATE 10 MG PO TABS: 10.0000 mg | ORAL_TABLET | Freq: Every day | ORAL | Status: DC

## 2015-08-19 NOTE — Telephone Encounter (Signed)
Please call in ambien with 1 refills 

## 2015-08-28 LAB — HM DIABETES EYE EXAM

## 2015-08-29 ENCOUNTER — Ambulatory Visit (INDEPENDENT_AMBULATORY_CARE_PROVIDER_SITE_OTHER): Payer: BLUE CROSS/BLUE SHIELD | Admitting: Nurse Practitioner

## 2015-08-29 ENCOUNTER — Encounter: Payer: Self-pay | Admitting: Nurse Practitioner

## 2015-08-29 VITALS — BP 126/84 | HR 69 | Temp 98.0°F | Ht 65.0 in | Wt 273.4 lb

## 2015-08-29 DIAGNOSIS — E032 Hypothyroidism due to medicaments and other exogenous substances: Secondary | ICD-10-CM

## 2015-08-29 DIAGNOSIS — E1165 Type 2 diabetes mellitus with hyperglycemia: Secondary | ICD-10-CM | POA: Diagnosis not present

## 2015-08-29 DIAGNOSIS — E119 Type 2 diabetes mellitus without complications: Secondary | ICD-10-CM

## 2015-08-29 DIAGNOSIS — F411 Generalized anxiety disorder: Secondary | ICD-10-CM

## 2015-08-29 DIAGNOSIS — E785 Hyperlipidemia, unspecified: Secondary | ICD-10-CM | POA: Diagnosis not present

## 2015-08-29 DIAGNOSIS — G4700 Insomnia, unspecified: Secondary | ICD-10-CM

## 2015-08-29 DIAGNOSIS — K219 Gastro-esophageal reflux disease without esophagitis: Secondary | ICD-10-CM

## 2015-08-29 DIAGNOSIS — I10 Essential (primary) hypertension: Secondary | ICD-10-CM

## 2015-08-29 DIAGNOSIS — F329 Major depressive disorder, single episode, unspecified: Secondary | ICD-10-CM | POA: Diagnosis not present

## 2015-08-29 DIAGNOSIS — G4733 Obstructive sleep apnea (adult) (pediatric): Secondary | ICD-10-CM

## 2015-08-29 DIAGNOSIS — F32A Depression, unspecified: Secondary | ICD-10-CM

## 2015-08-29 DIAGNOSIS — Z1212 Encounter for screening for malignant neoplasm of rectum: Secondary | ICD-10-CM

## 2015-08-29 LAB — BAYER DCA HB A1C WAIVED: HB A1C: 8.4 % — AB (ref <7.0)

## 2015-08-29 MED ORDER — CLONAZEPAM 0.5 MG PO TABS: ORAL_TABLET | ORAL | Status: DC

## 2015-08-29 MED ORDER — LINAGLIPTIN 5 MG PO TABS: 5.0000 mg | ORAL_TABLET | Freq: Every day | ORAL | Status: DC

## 2015-08-29 MED ORDER — ZOLPIDEM TARTRATE 10 MG PO TABS: 10.0000 mg | ORAL_TABLET | Freq: Every day | ORAL | Status: DC

## 2015-08-29 NOTE — Progress Notes (Signed)
Subjective:    Patient ID: [name], female    DOB: June 26, 1955, 60 y.o.   MRN: [id number]   Patient here today for follow up of chronic medical problems.  Outpatient Encounter Prescriptions as of 08/29/2015  Medication Sig  . aspirin EC 81 MG tablet Take 1 tablet (81 mg total) by mouth daily.  Marland Kitchen atenolol (TENORMIN) 100 MG tablet Take 1 tablet (100 mg total) by mouth daily.  . Calcium Carbonate-Vitamin D (CALCIUM 600 + D PO) Take 1 tablet by mouth daily.   . clonazePAM (KLONOPIN) 0.5 MG tablet TAKE 1 TABLET 2 TIMES A DAY AS NEEDED FOR ANXIETY  . dexlansoprazole (DEXILANT) 60 MG capsule TAKE 1 CAPSULE (60 MG TOTAL) BY MOUTH 2 (TWO) TIMES DAILY.  Marland Kitchen escitalopram (LEXAPRO) 20 MG tablet Take 1 tablet (20 mg total) by mouth daily.  Marland Kitchen glucose blood (ONETOUCH VERIO) test strip Use to check BG 1 to 2 times daily  . hydrochlorothiazide (HYDRODIURIL) 25 MG tablet TAKE 1 TABLET (25 MG TOTAL) BY MOUTH DAILY.  Marland Kitchen levothyroxine (SYNTHROID, LEVOTHROID) 175 MCG tablet Take 1 tablet by mouth daily. Except Sunday.  Takes 200 mg tab on Sunday.  . levothyroxine (SYNTHROID, LEVOTHROID) 200 MCG tablet Take 200 mcg by mouth. Take 1 pill on Sunday  . linagliptin (TRADJENTA) 5 MG TABS tablet Take 1 tablet (5 mg total) by mouth daily.  . Multiple Vitamins-Minerals (MULTI FOR HER 50+) CAPS Take 1 capsule by mouth daily.  [name] DELICA LANCETS [id number] MISC Use to check BG 1 to 2 times daily  . pravastatin (PRAVACHOL) 40 MG tablet TAKE 1 TABLET (40 MG TOTAL) BY MOUTH EVERY EVENING.  Marland Kitchen zolpidem (AMBIEN) 10 MG tablet Take 1 tablet (10 mg total) by mouth at bedtime.   No facility-administered encounter medications on file as of 08/29/2015.     Hypertension This is a chronic problem. The current episode started more than 1 year ago. The problem is unchanged. The problem is controlled. Pertinent negatives include no chest pain, neck pain, palpitations or shortness of breath. Risk factors for coronary artery disease  include dyslipidemia, obesity and post-menopausal state. Past treatments include beta blockers and diuretics. The current treatment provides moderate improvement. Compliance problems include diet and exercise.  Hypertensive end-organ damage includes a thyroid problem.  Hyperlipidemia This is a chronic problem. The current episode started more than 1 year ago. Recent lipid tests were reviewed and are variable. Pertinent negatives include no chest pain or shortness of breath. She is currently on no antihyperlipidemic treatment. The current treatment provides mild improvement of lipids. Compliance problems include adherence to diet and adherence to exercise.  Risk factors for coronary artery disease include dyslipidemia, hypertension and obesity.  Thyroid Problem Visit type: hypothyroidism- patient sees endocrinologist. Patient reports no palpitations. Her past medical history is significant for hyperlipidemia.  Diabetes She presents for her follow-up diabetic visit. She has type 2 diabetes mellitus. No MedicAlert identification noted. Her disease course has been fluctuating (last Hgba1c was 8.2%). There are no hypoglycemic associated symptoms. Pertinent negatives for diabetes include no chest pain. There are no hypoglycemic complications. Symptoms are stable. There are no diabetic complications. Current diabetic treatment includes oral agent (dual therapy). She is compliant with treatment some of the time. Her weight is stable. When asked about meal planning, she reported none. She has not had a previous visit with a dietitian. She rarely participates in exercise. Her breakfast blood glucose is taken between 8-9 am. Her breakfast blood glucose range is generally  130-140 mg/dl. Her highest blood glucose is 180-200 mg/dl. Her overall blood glucose range is 130-140 mg/dl. An ACE inhibitor/angiotensin II receptor blocker is being taken. She does not see a podiatrist.Eye exam is not current.  insomnia ambien 10  qhs- sleeps well- uses CPAP nightly- feels rested in mornings GAD/Depression  GERD Dexilant keeps symptoms under control- thsi is the only med that has worked for her sympytoms. Migraine imitrex as needed- has not had one since February of 2015.   Review of Systems  Constitutional: Negative.   HENT: Negative.   Respiratory: Negative for shortness of breath.   Cardiovascular: Negative for chest pain and palpitations.  Genitourinary: Negative.   Musculoskeletal: Negative for neck pain.  Neurological: Negative.   Psychiatric/Behavioral: Negative.   All other systems reviewed and are negative.      Objective:   Physical Exam  Constitutional: She is oriented to person, place, and time. She appears well-developed and well-nourished.  HENT:  Nose: Nose normal.  Mouth/Throat: Oropharynx is clear and moist.  Eyes: EOM are normal.  Neck: Trachea normal, normal range of motion and full passive range of motion without pain. Neck supple. No JVD present. Carotid bruit is not present. No thyromegaly present.  Cardiovascular: Normal rate, regular rhythm, normal heart sounds and intact distal pulses.  Exam reveals no gallop and no friction rub.   No murmur heard. Pulmonary/Chest: Effort normal and breath sounds normal.  Abdominal: Soft. Bowel sounds are normal. She exhibits no distension and no mass. There is no tenderness.  Musculoskeletal: Normal range of motion.  Lymphadenopathy:    She has no cervical adenopathy.  Neurological: She is alert and oriented to person, place, and time. She has normal reflexes.  Skin: Skin is warm and dry.  Erythematous macular lesion 2cm right forearm  Psychiatric: She has a normal mood and affect. Her behavior is normal. Judgment and thought content normal.   BP 126/84 mmHg  Pulse 69  Temp(Src) 98 F (36.7 C) (Oral)  Ht 5\' 5"  (1.651 m)  Wt 273 lb 6.4 oz (124.013 kg)  BMI 45.50 kg/m2  LMP 05/24/1986 (Approximate)   HGBA1c 8.4%- up from 8.2% 3 months  ago     Assessment & Plan:   1. Type 2 diabetes mellitus without complication, without long-term current use of insulin ([place]) hgba1c has increased- appointment with T. Eckard to discuss triseba - Bayer DCA Hb A1c Waived - linagliptin (TRADJENTA) 5 MG TABS tablet; Take 1 tablet (5 mg total) by mouth daily.  Dispense: 90 tablet; Refill: 0  2. Hyperlipidemia Low fta diet  3. Essential hypertension Do not add salt to diet  4. Screening for malignant neoplasm of the rectum - Fecal occult blood, imunochemical; Future  5. OSA (obstructive sleep apnea) continue cpap machine  6. Gastroesophageal reflux disease without esophagitis Avoid spicy foods Do not eat 2 hours prior to bedtime  7. Hypothyroidism due to medication  8. GAD (generalized anxiety disorder) Stress management - clonazePAM (KLONOPIN) 0.5 MG tablet; TAKE 1 TABLET 2 TIMES A DAY AS NEEDED FOR ANXIETY  Dispense: 60 tablet; Refill: 1  9. Depression  10. Insomnia Bedtime ritual - zolpidem (AMBIEN) 10 MG tablet; Take 1 tablet (10 mg total) by mouth at bedtime.  Dispense: 30 tablet; Refill: 1  11. Morbid obesity, unspecified obesity type ([place]) Discussed diet and exercise for person with BMI >25 Will recheck weight in 3-6 months    Labs pending- [facility]- [facility] written for labs Health  maintenance reviewed Diet and exercise encouraged Continue all meds Follow up  In 4 month   [place], FNP

## 2015-08-29 NOTE — Patient Instructions (Signed)

## 2015-09-18 ENCOUNTER — Encounter: Payer: Self-pay | Admitting: Pharmacist

## 2015-09-18 ENCOUNTER — Ambulatory Visit (INDEPENDENT_AMBULATORY_CARE_PROVIDER_SITE_OTHER): Payer: BLUE CROSS/BLUE SHIELD | Admitting: Pharmacist

## 2015-09-18 VITALS — BP 124/80 | HR 75 | Ht 65.0 in | Wt 274.0 lb

## 2015-09-18 DIAGNOSIS — E119 Type 2 diabetes mellitus without complications: Secondary | ICD-10-CM | POA: Diagnosis not present

## 2015-09-18 DIAGNOSIS — E669 Obesity, unspecified: Secondary | ICD-10-CM

## 2015-09-18 MED ORDER — DULAGLUTIDE 0.75 MG/0.5ML ~~LOC~~ SOAJ: 0.7500 mg | SUBCUTANEOUS | Status: DC

## 2015-09-18 NOTE — Patient Instructions (Signed)
Diabetes and Standards of Medical Care   Diabetes is complicated. You may find that your diabetes team includes a dietitian, nurse, diabetes educator, eye doctor, and more. To help everyone know what is going on and to help you get the care you deserve, the following schedule of care was developed to help keep you on track. Below are the tests, exams, vaccines, medicines, education, and plans you will need.  Start checking blood glucose at different times of day - sometimes in morning, sometimes after a meal and sometimes in the afternoon or evening.   Blood Glucose Goals Prior to meals = 80 - 130 Within 2 hours of the start of a meal = less than 180  HbA1c test (goal is less than 7.0% - your last value was 8.4%) This test shows how well you have controlled your glucose over the past 2 to 3 months. It is used to see if your diabetes management plan needs to be adjusted.   It is performed at least 2 times a year if you are meeting treatment goals.  It is performed 4 times a year if therapy has changed or if you are not meeting treatment goals.  Blood pressure test  This test is performed at every routine medical visit. The goal is less than 140/90 mmHg for most people, but 130/80 mmHg in some cases. Ask your health care provider about your goal.  Dental exam  Follow up with the dentist regularly.  Eye exam  If you are diagnosed with type 1 diabetes as a child, get an exam upon reaching the age of 54 years or older and have had diabetes for 3 to 5 years. Yearly eye exams are recommended after that initial eye exam.  If you are diagnosed with type 1 diabetes as an adult, get an exam within 5 years of diagnosis and then yearly.  If you are diagnosed with type 2 diabetes, get an exam as soon as possible after the diagnosis and then yearly.  Foot care exam  Visual foot exams are performed at every routine medical visit. The exams check for cuts, injuries, or other problems with the  feet.  A comprehensive foot exam should be done yearly. This includes visual inspection as well as assessing foot pulses and testing for loss of sensation.  Check your feet nightly for cuts, injuries, or other problems with your feet. Tell your health care provider if anything is not healing.  Kidney function test (urine microalbumin)  This test is performed once a year.  Type 1 diabetes: The first test is performed 5 years after diagnosis.  Type 2 diabetes: The first test is performed at the time of diagnosis.  A serum creatinine and estimated glomerular filtration rate (eGFR) test is done once a year to assess the level of chronic kidney disease (CKD), if present.  Lipid profile (cholesterol, HDL, LDL, triglycerides)  Performed every 5 years for most people.  The goal for LDL is less than 100 mg/dL. If you are at high risk, the goal is less than 70 mg/dL.  The goal for HDL is 40 mg/dL to 50 mg/dL for men and 50 mg/dL to 60 mg/dL for women. An HDL cholesterol of 60 mg/dL or higher gives some protection against heart disease.  The goal for triglycerides is less than 150 mg/dL.  Influenza vaccine, pneumococcal vaccine, and hepatitis B vaccine  The influenza vaccine is recommended yearly.  The pneumococcal vaccine is generally given once in a lifetime. However, there are  some instances when another vaccination is recommended. Check with your health care provider.  The hepatitis B vaccine is also recommended for adults with diabetes.  Diabetes self-management education  Education is recommended at diagnosis and ongoing as needed.  Treatment plan  Your treatment plan is reviewed at every medical visit.  Document Released: 03/07/2009 Document Revised: 01/10/2013 Document Reviewed: 10/10/2012 [facility] Patient Information 2014 [place].

## 2015-09-18 NOTE — Progress Notes (Signed)
Patient ID: [name], female   DOB: [birth year], 60 y.o.   MRN: [id number] Subjective:    [name] is a 60 y.o. female who presents for an evaluation of Type 2 diabetes mellitus. The patient was initially diagnosed with Type 2 diabetes mellitus  with A1c of 7.9% and FBG of 226. She is taking tradgenta 5mg  1 tablet daily. She tried metformin about 2 years ago at her endocrinologist's office (sees Dr Chalmers Cater for thyroid disorder) and was unable to tolerate - had severe diarrhea.  Known diabetic complications: none Cardiovascular risk factors: diabetes mellitus, dyslipidemia, hypertension and obesity (BMI >= 30 kg/m2)  Eye exam current (within one year): yes Weight trend: stable Prior visit with CDE:  yes Current diet: in general, a "healthy" diet   trying to limit to CHO.  No sodas or sweet tea.  No sweets.  Current exercise: walking - 0.25 mile daily  Current monitoring regimen: home blood tests - once daily Home blood sugar records: fasting range: 123 to 180 but patient is only checking fasting in am.   RBG in office today was 265 Any episodes of hypoglycemia? No  Is She on ACE inhibitor or angiotensin II receptor blocker?  No   The following portions of the patient's history were reviewed and updated as appropriate: allergies, current medications, past family history, past medical history and problem list.   Objective:    LMP 05/24/1986 (Approximate)   A1c = 8.4% (09/01/2015) - in past was well controlled but has been slowly increasing over the last 6 to 8 months.  Lab Review GLUCOSE, BLD (mg/dL)  Date Value  05/13/2015 147*  01/30/2015 199*  10/24/2014 166*   CO2  Date Value  05/13/2015 32 mmol/L*  01/30/2015 30 mmol/L  10/24/2014 29 mEq/L   BUN (mg/dL)  Date Value  05/13/2015 14  01/30/2015 15  10/24/2014 16   CREAT (mg/dL)  Date Value  05/13/2015 0.70  01/30/2015 0.75  10/24/2014 0.74    Assessment:    Diabetes Mellitus type II, under  inadequate control.   Obesity   Plan:    1.  Rx changes: Switch tradgenta to Trulicity 0.75mg  - inject SQ each weekly 2.  Education: Reviewed 'ABCs' of diabetes management (respective goals in parentheses):  A1C (<7), blood pressure (<130/80), and cholesterol (LDL <100). 3.  Continue with current diet changes / limiting CHO's.  Also continue with daily walking. 4. Checked BG 1 to 2 times daily and vary times of day 5. Follow up: 4 weeks    Cherre Robins, PharmD, CPP. CDE

## 2015-09-23 ENCOUNTER — Other Ambulatory Visit: Payer: Self-pay | Admitting: Pharmacist

## 2015-10-13 ENCOUNTER — Other Ambulatory Visit: Payer: Self-pay | Admitting: Nurse Practitioner

## 2015-10-14 NOTE — Telephone Encounter (Signed)
Please call in klonopin and [place] with 1 refillsn each

## 2015-10-14 NOTE — Telephone Encounter (Signed)
rx called into pharmacy

## 2015-10-14 NOTE — Telephone Encounter (Signed)
Last seen 08/29/15  MMM  If approved route to nurse to call into [facility]

## 2015-10-17 ENCOUNTER — Ambulatory Visit (INDEPENDENT_AMBULATORY_CARE_PROVIDER_SITE_OTHER): Payer: BLUE CROSS/BLUE SHIELD | Admitting: Pharmacist

## 2015-10-17 ENCOUNTER — Encounter: Payer: Self-pay | Admitting: Pharmacist

## 2015-10-17 VITALS — BP 122/82 | HR 70 | Ht 65.0 in | Wt 266.0 lb

## 2015-10-17 DIAGNOSIS — E119 Type 2 diabetes mellitus without complications: Secondary | ICD-10-CM | POA: Diagnosis not present

## 2015-10-17 DIAGNOSIS — E669 Obesity, unspecified: Secondary | ICD-10-CM

## 2015-10-17 MED ORDER — DULAGLUTIDE 1.5 MG/0.5ML ~~LOC~~ SOAJ: 1.5000 mg | SUBCUTANEOUS | Status: DC

## 2015-10-17 NOTE — Progress Notes (Signed)
Patient ID: [name], female   DOB: 04/13/1956, 60 y.o.   MRN: [id number] Subjective:    [name] is a 60 y.o. female who presents for an evaluation of Type 2 diabetes mellitus. The patient was initially diagnosed with Type 2 diabetes mellitus with A1c of 7.9% and FBG of 226. I last saw patient about 4 weeks ago when Trulicity 0.75mg  q week was started and Tradjenta was discontinued.  Patient states she has not had any nausea or abd pain but she has noticed that she is feeling fuller sooner.  She tried metformin about 2 years ago at her endocrinologist's office (sees Dr Chalmers Cater for thyroid disorder) and was unable to tolerate - had severe diarrhea.  Known diabetic complications: none Cardiovascular risk factors: diabetes mellitus, dyslipidemia, hypertension and obesity (BMI >= 30 kg/m2)  Eye exam current (within one year): yes Weight trend: decreased by 8# over the last month Prior visit with CDE:  yes Current diet: in general, a "healthy" diet   trying to limit to CHO.  No sodas or sweet tea.  No sweets. Drinks unsweet tea or propel grape water Current exercise: walking - 0.25 mile daily  Current monitoring regimen: home blood tests - once daily Home blood sugar records: fasting range: 152 to 220 7 day avg = 202 14 day avg = 209 30 day avg = 189 Any episodes of hypoglycemia? No  Is She on ACE inhibitor or angiotensin II receptor blocker?  No   The following portions of the patient's history were reviewed and updated as appropriate: allergies, current medications, past family history, past medical history and problem list.   Objective:    BP 122/82 mmHg  Pulse 70  Ht 5\' 5"  (1.651 m)  Wt 266 lb (120.657 kg)  BMI 44.26 kg/m2  LMP 05/24/1986 (Approximate)   A1c = 8.4% (09/01/2015) - in past was well controlled but has been slowly increasing over the last 6 to 8 months.  Lab Review GLUCOSE, BLD (mg/dL)  Date Value  05/13/2015 147*  01/30/2015 199*  10/24/2014  166*   CO2  Date Value  05/13/2015 32 mmol/L*  01/30/2015 30 mmol/L  10/24/2014 29 mEq/L   BUN (mg/dL)  Date Value  05/13/2015 14  01/30/2015 15  10/24/2014 16   CREAT (mg/dL)  Date Value  05/13/2015 0.70  01/30/2015 0.75  10/24/2014 0.74    Assessment:    Diabetes Mellitus type II, under inadequate control.   Obesity   Plan:    1.  Rx changes: Increase Trulicity to 1.5mg  - inject SQ each weekly 2.  Education: Reviewed 'ABCs' of diabetes management (respective goals in parentheses):  A1C (<7), blood pressure (<130/80), and cholesterol (LDL <100). 3.  Continue with current diet changes / limiting CHO's.  Also continue with daily walking. 4. Checked BG 1 to 2 times daily and vary times of day 5. Follow up: 6 weeks   With PCP  Cherre Robins, PharmD, CPP. CDE

## 2015-11-13 ENCOUNTER — Other Ambulatory Visit: Payer: Self-pay

## 2015-11-13 MED ORDER — GLUCOSE BLOOD VI STRP: ORAL_STRIP | Status: DC

## 2015-11-28 ENCOUNTER — Ambulatory Visit (INDEPENDENT_AMBULATORY_CARE_PROVIDER_SITE_OTHER): Payer: BLUE CROSS/BLUE SHIELD | Admitting: Nurse Practitioner

## 2015-11-28 ENCOUNTER — Encounter: Payer: Self-pay | Admitting: Nurse Practitioner

## 2015-11-28 VITALS — BP 117/73 | HR 65 | Temp 97.3°F | Ht 65.0 in | Wt 264.0 lb

## 2015-11-28 DIAGNOSIS — F411 Generalized anxiety disorder: Secondary | ICD-10-CM

## 2015-11-28 DIAGNOSIS — K219 Gastro-esophageal reflux disease without esophagitis: Secondary | ICD-10-CM

## 2015-11-28 DIAGNOSIS — I10 Essential (primary) hypertension: Secondary | ICD-10-CM | POA: Diagnosis not present

## 2015-11-28 DIAGNOSIS — F329 Major depressive disorder, single episode, unspecified: Secondary | ICD-10-CM | POA: Diagnosis not present

## 2015-11-28 DIAGNOSIS — E785 Hyperlipidemia, unspecified: Secondary | ICD-10-CM | POA: Diagnosis not present

## 2015-11-28 DIAGNOSIS — E032 Hypothyroidism due to medicaments and other exogenous substances: Secondary | ICD-10-CM | POA: Diagnosis not present

## 2015-11-28 DIAGNOSIS — E1165 Type 2 diabetes mellitus with hyperglycemia: Secondary | ICD-10-CM

## 2015-11-28 DIAGNOSIS — E119 Type 2 diabetes mellitus without complications: Secondary | ICD-10-CM

## 2015-11-28 DIAGNOSIS — F32A Depression, unspecified: Secondary | ICD-10-CM

## 2015-11-28 DIAGNOSIS — G4700 Insomnia, unspecified: Secondary | ICD-10-CM

## 2015-11-28 LAB — LIPID PANEL
CHOL/HDL RATIO: 5.3 ratio — AB (ref 0.0–4.4)
Cholesterol, Total: 174 mg/dL (ref 100–199)
HDL: 33 mg/dL — ABNORMAL LOW (ref >39)
LDL Calculated: 95 mg/dL (ref 0–99)
Triglycerides: 232 mg/dL — ABNORMAL HIGH (ref 0–149)
VLDL CHOLESTEROL CAL: 46 mg/dL — AB (ref 5–40)

## 2015-11-28 LAB — CMP14+EGFR
A/G RATIO: 1.9 (ref 1.2–2.2)
ALBUMIN: 3.7 g/dL (ref 3.6–4.8)
ALT: 16 IU/L (ref 0–32)
AST: 14 IU/L (ref 0–40)
Alkaline Phosphatase: 85 IU/L (ref 39–117)
BILIRUBIN TOTAL: 0.3 mg/dL (ref 0.0–1.2)
BUN / CREAT RATIO: 20 (ref 12–28)
BUN: 18 mg/dL (ref 8–27)
CALCIUM: 9.1 mg/dL (ref 8.7–10.3)
CHLORIDE: 97 mmol/L (ref 96–106)
CO2: 24 mmol/L (ref 18–29)
Creatinine, Ser: 0.88 mg/dL (ref 0.57–1.00)
GFR, EST AFRICAN AMERICAN: 83 mL/min/{1.73_m2} (ref >59)
GFR, EST NON AFRICAN AMERICAN: 72 mL/min/{1.73_m2} (ref >59)
GLUCOSE: 243 mg/dL — AB (ref 65–99)
Globulin, Total: 2 g/dL (ref 1.5–4.5)
Potassium: 3.9 mmol/L (ref 3.5–5.2)
Sodium: 139 mmol/L (ref 134–144)
TOTAL PROTEIN: 5.7 g/dL — AB (ref 6.0–8.5)

## 2015-11-28 LAB — BAYER DCA HB A1C WAIVED: HB A1C (BAYER DCA - WAIVED): 8.6 % — ABNORMAL HIGH (ref <7.0)

## 2015-11-28 MED ORDER — ATENOLOL 100 MG PO TABS: 100.0000 mg | ORAL_TABLET | Freq: Every day | ORAL | Status: DC

## 2015-11-28 MED ORDER — CLONAZEPAM 0.5 MG PO TABS: 0.5000 mg | ORAL_TABLET | Freq: Every day | ORAL | Status: DC

## 2015-11-28 MED ORDER — HYDROCHLOROTHIAZIDE 25 MG PO TABS: ORAL_TABLET | ORAL | Status: DC

## 2015-11-28 MED ORDER — PRAVASTATIN SODIUM 40 MG PO TABS: ORAL_TABLET | ORAL | Status: DC

## 2015-11-28 MED ORDER — INSULIN DEGLUDEC 100 UNIT/ML ~~LOC~~ SOPN: 20.0000 [IU] | PEN_INJECTOR | Freq: Every evening | SUBCUTANEOUS | Status: DC | PRN

## 2015-11-28 MED ORDER — ZOLPIDEM TARTRATE 10 MG PO TABS: 10.0000 mg | ORAL_TABLET | Freq: Every day | ORAL | Status: DC

## 2015-11-28 MED ORDER — LEVOTHYROXINE SODIUM 175 MCG PO TABS: 175.0000 ug | ORAL_TABLET | Freq: Every day | ORAL | Status: DC

## 2015-11-28 MED ORDER — LINAGLIPTIN 5 MG PO TABS: 5.0000 mg | ORAL_TABLET | Freq: Every day | ORAL | Status: DC

## 2015-11-28 MED ORDER — INSULIN GLARGINE 100 UNIT/ML SOLOSTAR PEN: 20.0000 [IU] | PEN_INJECTOR | Freq: Every day | SUBCUTANEOUS | Status: DC

## 2015-11-28 MED ORDER — LEVOTHYROXINE SODIUM 200 MCG PO TABS: 200.0000 ug | ORAL_TABLET | Freq: Every day | ORAL | Status: DC

## 2015-11-28 MED ORDER — DULAGLUTIDE 1.5 MG/0.5ML ~~LOC~~ SOAJ: 1.5000 mg | SUBCUTANEOUS | Status: DC

## 2015-11-28 MED ORDER — ESCITALOPRAM OXALATE 20 MG PO TABS: 20.0000 mg | ORAL_TABLET | Freq: Every day | ORAL | Status: DC

## 2015-11-28 NOTE — Progress Notes (Addendum)
Subjective:    Patient ID: [name], female    DOB: [birth year], 60 y.o.   MRN: [id number]   Patient here today for follow up of chronic medical problems.  Outpatient Encounter Prescriptions as of 11/28/2015  Medication Sig  . aspirin EC 81 MG tablet Take 1 tablet (81 mg total) by mouth daily.  Marland Kitchen atenolol (TENORMIN) 100 MG tablet Take 1 tablet (100 mg total) by mouth daily.  . Calcium Carbonate-Vitamin D (CALCIUM 600 + D PO) Take 1 tablet by mouth daily.   . clonazePAM (KLONOPIN) 0.5 MG tablet TAKE 1 TABLET TWICE A DAY AS NEEDED FOR ANXIETY  . dexlansoprazole (DEXILANT) 60 MG capsule TAKE 1 CAPSULE (60 MG TOTAL) BY MOUTH 2 (TWO) TIMES DAILY.  . Dulaglutide (TRULICITY) 1.5 WU/9.8JX SOPN Inject 1.5 mg into the skin once a week.  . escitalopram (LEXAPRO) 20 MG tablet Take 1 tablet (20 mg total) by mouth daily.  Marland Kitchen glucose blood (ONETOUCH VERIO) test strip USE TO CHECK BG 1 TO 2 TIMES DAILY  . hydrochlorothiazide (HYDRODIURIL) 25 MG tablet TAKE 1 TABLET (25 MG TOTAL) BY MOUTH DAILY.  Marland Kitchen levothyroxine (SYNTHROID, LEVOTHROID) 175 MCG tablet Take 1 tablet by mouth daily. Except Sunday.  Takes 200 mg tab on Sunday.  . levothyroxine (SYNTHROID, LEVOTHROID) 200 MCG tablet Take 200 mcg by mouth. Take 1 pill on Sunday  . linagliptin (TRADJENTA) 5 MG TABS tablet Take 5 mg by mouth daily.  . Multiple Vitamins-Minerals (MULTI FOR HER 50+) CAPS Take 1 capsule by mouth daily.  [name] DELICA LANCETS 91Y MISC Use to check BG 1 to 2 times daily  . pravastatin (PRAVACHOL) 40 MG tablet TAKE 1 TABLET (40 MG TOTAL) BY MOUTH EVERY EVENING.  Marland Kitchen zolpidem (AMBIEN) 10 MG tablet TAKE 1 TABLET AT BEDTIME   No facility-administered encounter medications on file as of 11/28/2015.     Hypertension This is a chronic problem. The current episode started more than 1 year ago. The problem is unchanged. The problem is controlled. Pertinent negatives include no chest pain, neck pain, palpitations or shortness of  breath. Risk factors for coronary artery disease include dyslipidemia, obesity and post-menopausal state. Past treatments include beta blockers and diuretics. The current treatment provides moderate improvement. Compliance problems include diet and exercise.  Hypertensive end-organ damage includes a thyroid problem.  Hyperlipidemia This is a chronic problem. The current episode started more than 1 year ago. Recent lipid tests were reviewed and are variable. Pertinent negatives include no chest pain or shortness of breath. She is currently on no antihyperlipidemic treatment. The current treatment provides mild improvement of lipids. Compliance problems include adherence to diet and adherence to exercise.  Risk factors for coronary artery disease include dyslipidemia, hypertension and obesity.  Thyroid Problem Visit type: hypothyroidism- patient sees endocrinologist. Patient reports no palpitations. Her past medical history is significant for hyperlipidemia.  Diabetes She presents for her follow-up diabetic visit. She has type 2 diabetes mellitus. No MedicAlert identification noted. Her disease course has been fluctuating (last Hgba1c was 8.2%). There are no hypoglycemic associated symptoms. Pertinent negatives for diabetes include no chest pain. There are no hypoglycemic complications. Symptoms are stable. There are no diabetic complications. Current diabetic treatment includes oral agent (dual therapy) (has been on trulicity for several months.). She is compliant with treatment some of the time. Her weight is stable. When asked about meal planning, she reported none. She has not had a previous visit with a dietitian. She rarely participates in exercise. Her breakfast  blood glucose is taken between 8-9 am. Her breakfast blood glucose range is generally 180-200 mg/dl. Her highest blood glucose is >200 mg/dl. Her overall blood glucose range is 130-140 mg/dl. An ACE inhibitor/angiotensin II receptor blocker is  being taken. She does not see a podiatrist.Eye exam is not current.  insomnia ambien 10 qhs- sleeps well- uses CPAP nightly- feels rested in mornings GAD/Depression Currently on lexapro- has been taking for awhile- no side effects GERD Dexilant keeps symptoms under control- thsi is the only med that has worked for her sympytoms. Migraine imitrex as needed- has not had one since February of 2015.   Review of Systems  Constitutional: Negative.   HENT: Negative.   Respiratory: Negative for shortness of breath.   Cardiovascular: Negative for chest pain and palpitations.  Genitourinary: Negative.   Musculoskeletal: Negative for neck pain.  Neurological: Negative.   Psychiatric/Behavioral: Negative.   All other systems reviewed and are negative.      Objective:   Physical Exam  Constitutional: She is oriented to person, place, and time. She appears well-developed and well-nourished.  HENT:  Nose: Nose normal.  Mouth/Throat: Oropharynx is clear and moist.  Eyes: EOM are normal.  Neck: Trachea normal, normal range of motion and full passive range of motion without pain. Neck supple. No JVD present. Carotid bruit is not present. No thyromegaly present.  Cardiovascular: Normal rate, regular rhythm, normal heart sounds and intact distal pulses.  Exam reveals no gallop and no friction rub.   No murmur heard. Pulmonary/Chest: Effort normal and breath sounds normal.  Abdominal: Soft. Bowel sounds are normal. She exhibits no distension and no mass. There is no tenderness.  Musculoskeletal: Normal range of motion.  Lymphadenopathy:    She has no cervical adenopathy.  Neurological: She is alert and oriented to person, place, and time. She has normal reflexes.  Skin: Skin is warm and dry.  Erythematous macular lesion 2cm right forearm  Psychiatric: She has a normal mood and affect. Her behavior is normal. Judgment and thought content normal.   BP 117/73 mmHg  Pulse 65  Temp(Src) 97.3 F  (36.3 C) (Oral)  Ht _0  (1.651 m)  Wt 264 lb (119.75 kg)  BMI 43.93 kg/m2  LMP 05/24/1986 (Approximate)  hgba1c 8.6%- up from 8.4 on 08/29/15     Assessment & Plan:  1. Type 2 diabetes mellitus without complication, without long-term current use of insulin (HCC) Stricter carb counting Starting on lantus 20 u daily- had no lantus samples so was given tresiba sample- to use till runs out then start lantus- demo on how to use pen - Bayer DCA Hb A1c Waived - Microalbumin / creatinine urine ratio - Dulaglutide (TRULICITY) 1.5 VV/7.4MO SOPN; Inject 1.5 mg into the skin once a week.  Dispense: 4 mL; Refill: 1 - linagliptin (TRADJENTA) 5 MG TABS tablet; Take 1 tablet (5 mg total) by mouth daily.  Dispense: 30 tablet; Refill: 5 - Insulin Glargine (LANTUS SOLOSTAR) 100 UNIT/ML Solostar Pen; Inject 20 Units into the skin daily at 10 pm.  Dispense: 5 pen; Refill: PRN  2. Hyperlipidemia Low fat diet - Lipid panel - pravastatin (PRAVACHOL) 40 MG tablet; TAKE 1 TABLET (40 MG TOTAL) BY MOUTH EVERY EVENING.  Dispense: 90 tablet; Refill: 1  3. Essential hypertension Do not add salt to diet - CMP14+EGFR - atenolol (TENORMIN) 100 MG tablet; Take 1 tablet (100 mg total) by mouth daily.  Dispense: 90 tablet; Refill: 1 - hydrochlorothiazide (HYDRODIURIL) 25 MG tablet; TAKE 1 TABLET (  25 MG TOTAL) BY MOUTH DAILY.  Dispense: 90 tablet; Refill: 1  4. Gastroesophageal reflux disease without esophagitis Avoid spicy foods Do not eat 2 hours prior to bedtime  5. Hypothyroidism due to medication - levothyroxine (SYNTHROID, LEVOTHROID) 175 MCG tablet; Take 1 tablet (175 mcg total) by mouth daily. Except Sunday.  Takes 200 mg tab on Sunday.  Dispense: 90 tablet; Refill: 3 - levothyroxine (SYNTHROID, LEVOTHROID) 200 MCG tablet; Take 1 tablet (200 mcg total) by mouth daily before breakfast. Take 1 pill on Sunday  Dispense: 90 tablet; Refill: 1   6. GAD (generalized anxiety disorder) Stress management -  clonazePAM (KLONOPIN) 0.5 MG tablet; Take 1 tablet (0.5 mg total) by mouth daily.  Dispense: 60 tablet; Refill: 1  8. Depression - escitalopram (LEXAPRO) 20 MG tablet; Take 1 tablet (20 mg total) by mouth daily.  Dispense: 90 tablet; Refill: 1  9. Insomnia Bedtime ritual - zolpidem (AMBIEN) 10 MG tablet; Take 1 tablet (10 mg total) by mouth at bedtime.  Dispense: 30 tablet; Refill: 1  10. Morbid obesity, unspecified obesity type ([place]) Discussed diet and exercise for person with BMI >25 Will recheck weight in 3-6 months     Labs pending Health maintenance reviewed Diet and exercise encouraged Continue all meds Follow up  In 3 months   Mary-Margaret Hassell Done, FNP    * addendum- lantus not covered by insurance- changed to Tyler Aas

## 2015-11-28 NOTE — Patient Instructions (Signed)

## 2015-11-28 NOTE — Addendum Note (Signed)
Addended by: Chevis Pretty on: 11/28/2015 10:00 AM   Modules accepted: Orders, Medications

## 2015-11-29 LAB — MICROALBUMIN / CREATININE URINE RATIO
CREATININE, UR: 135.1 mg/dL
MICROALB/CREAT RATIO: 14.8 mg/g{creat} (ref 0.0–30.0)
Microalbumin, Urine: 20 ug/mL

## 2015-12-11 ENCOUNTER — Other Ambulatory Visit: Payer: Self-pay

## 2015-12-11 ENCOUNTER — Telehealth: Payer: Self-pay | Admitting: Nurse Practitioner

## 2015-12-11 MED ORDER — ONETOUCH DELICA LANCETS 33G MISC: Status: DC

## 2015-12-11 MED ORDER — PEN NEEDLES 31G X 5 MM MISC: 1.0000 | Freq: Every day | Status: DC

## 2015-12-11 NOTE — Telephone Encounter (Signed)
[facility]ancets [facility] to [facility]. Patient notified

## 2015-12-14 ENCOUNTER — Other Ambulatory Visit: Payer: Self-pay | Admitting: Nurse Practitioner

## 2015-12-14 DIAGNOSIS — F411 Generalized anxiety disorder: Secondary | ICD-10-CM

## 2015-12-15 ENCOUNTER — Other Ambulatory Visit: Payer: Self-pay

## 2015-12-15 DIAGNOSIS — E119 Type 2 diabetes mellitus without complications: Secondary | ICD-10-CM

## 2015-12-15 MED ORDER — DULAGLUTIDE 1.5 MG/0.5ML ~~LOC~~ SOAJ: 1.5000 mg | SUBCUTANEOUS | 0 refills | Status: DC

## 2015-12-15 NOTE — Telephone Encounter (Signed)
Please call in klonopin with 1 refills 

## 2015-12-23 ENCOUNTER — Telehealth: Payer: Self-pay | Admitting: Nurse Practitioner

## 2015-12-23 ENCOUNTER — Other Ambulatory Visit: Payer: Self-pay | Admitting: Nurse Practitioner

## 2015-12-23 DIAGNOSIS — K219 Gastro-esophageal reflux disease without esophagitis: Secondary | ICD-10-CM

## 2015-12-23 DIAGNOSIS — E119 Type 2 diabetes mellitus without complications: Secondary | ICD-10-CM

## 2015-12-26 ENCOUNTER — Other Ambulatory Visit: Payer: Self-pay | Admitting: Nurse Practitioner

## 2015-12-26 ENCOUNTER — Telehealth: Payer: Self-pay | Admitting: Nurse Practitioner

## 2015-12-26 MED ORDER — SULFAMETHOXAZOLE-TRIMETHOPRIM 800-160 MG PO TABS: 1.0000 | ORAL_TABLET | Freq: Two times a day (BID) | ORAL | 0 refills | Status: DC

## 2015-12-26 NOTE — Telephone Encounter (Signed)
Sent antibiotic into pharmacy

## 2015-12-26 NOTE — Telephone Encounter (Signed)
Pt aware rx sent to pharmacy.

## 2016-01-27 ENCOUNTER — Telehealth: Payer: Self-pay | Admitting: Physician Assistant

## 2016-01-27 DIAGNOSIS — E1165 Type 2 diabetes mellitus with hyperglycemia: Secondary | ICD-10-CM

## 2016-01-27 MED ORDER — INSULIN DEGLUDEC 100 UNIT/ML ~~LOC~~ SOPN: 20.0000 [IU] | PEN_INJECTOR | Freq: Every evening | SUBCUTANEOUS | 1 refills | Status: AC | PRN

## 2016-01-27 NOTE — Telephone Encounter (Signed)
Prescription sent to pharmacy.

## 2016-02-11 ENCOUNTER — Other Ambulatory Visit: Payer: Self-pay | Admitting: Nurse Practitioner

## 2016-02-11 DIAGNOSIS — G4700 Insomnia, unspecified: Secondary | ICD-10-CM

## 2016-02-11 DIAGNOSIS — F411 Generalized anxiety disorder: Secondary | ICD-10-CM

## 2016-02-12 NOTE — Telephone Encounter (Signed)
Please call in aolopidem and clonazepam with 1each refills

## 2016-02-12 NOTE — Telephone Encounter (Signed)
Pt last seen 11/28/15, klonopin last rx'd 7/24 #60 with 1 refill Ambien #30 with 1 refill last rx'd 7/7 If approved please route to Nurse Pool B for nurse to call in

## 2016-02-27 ENCOUNTER — Ambulatory Visit (INDEPENDENT_AMBULATORY_CARE_PROVIDER_SITE_OTHER): Payer: BLUE CROSS/BLUE SHIELD | Admitting: Nurse Practitioner

## 2016-02-27 ENCOUNTER — Encounter: Payer: Self-pay | Admitting: Nurse Practitioner

## 2016-02-27 VITALS — BP 132/76 | HR 64 | Temp 98.0°F | Ht 65.0 in | Wt 269.0 lb

## 2016-02-27 DIAGNOSIS — E119 Type 2 diabetes mellitus without complications: Secondary | ICD-10-CM

## 2016-02-27 DIAGNOSIS — I10 Essential (primary) hypertension: Secondary | ICD-10-CM

## 2016-02-27 DIAGNOSIS — K219 Gastro-esophageal reflux disease without esophagitis: Secondary | ICD-10-CM

## 2016-02-27 DIAGNOSIS — E032 Hypothyroidism due to medicaments and other exogenous substances: Secondary | ICD-10-CM

## 2016-02-27 DIAGNOSIS — E785 Hyperlipidemia, unspecified: Secondary | ICD-10-CM

## 2016-02-27 DIAGNOSIS — G4700 Insomnia, unspecified: Secondary | ICD-10-CM

## 2016-02-27 DIAGNOSIS — F411 Generalized anxiety disorder: Secondary | ICD-10-CM | POA: Diagnosis not present

## 2016-02-27 DIAGNOSIS — F3342 Major depressive disorder, recurrent, in full remission: Secondary | ICD-10-CM

## 2016-02-27 LAB — BAYER DCA HB A1C WAIVED: HB A1C: 8 % — AB (ref <7.0)

## 2016-02-27 MED ORDER — DULAGLUTIDE 1.5 MG/0.5ML ~~LOC~~ SOAJ: 1.5000 mg | SUBCUTANEOUS | 0 refills | Status: DC

## 2016-02-27 NOTE — Progress Notes (Signed)
Subjective:    Patient ID: [name], female    DOB: [birth year], 60 y.o.   MRN: [id number]   Patient here today for follow up of chronic medical problems. Denies any changes since last visit. No complaints today.  Outpatient Encounter Prescriptions as of 02/27/2016  Medication Sig  . aspirin EC 81 MG tablet Take 1 tablet (81 mg total) by mouth daily.  Marland Kitchen atenolol (TENORMIN) 100 MG tablet Take 1 tablet (100 mg total) by mouth daily.  . Calcium Carbonate-Vitamin D (CALCIUM 600 + D PO) Take 1 tablet by mouth daily.   . clonazePAM (KLONOPIN) 0.5 MG tablet TAKE 1 TABLET BY MOUTH TWICE A DAY AS NEEDED  . DEXILANT 60 MG capsule TAKE 1 CAPSULE (60 MG TOTAL) BY MOUTH 2 (TWO) TIMES DAILY.  . Dulaglutide (TRULICITY) 1.5 IE/3.3IR SOPN Inject 1.5 mg into the skin once a week.  . escitalopram (LEXAPRO) 20 MG tablet Take 1 tablet (20 mg total) by mouth daily.  Marland Kitchen glucose blood (ONETOUCH VERIO) test strip USE TO CHECK BG 1 TO 2 TIMES DAILY  . hydrochlorothiazide (HYDRODIURIL) 25 MG tablet TAKE 1 TABLET (25 MG TOTAL) BY MOUTH DAILY.  Marland Kitchen Insulin Degludec (TRESIBA FLEXTOUCH) 100 UNIT/ML SOPN Inject 20 Units into the skin at bedtime and may repeat dose one time if needed.  . Insulin Pen Needle (PEN NEEDLES) 31G X 5 MM MISC 1 each by Does not apply route daily. Duffy Bruce tresiba injection  Dx E11.9  . levothyroxine (SYNTHROID, LEVOTHROID) 175 MCG tablet Take 1 tablet (175 mcg total) by mouth daily. Except Sunday.  Takes 200 mg tab on Sunday.  . levothyroxine (SYNTHROID, LEVOTHROID) 200 MCG tablet Take 1 tablet (200 mcg total) by mouth daily before breakfast. Take 1 pill on Sunday  . linagliptin (TRADJENTA) 5 MG TABS tablet Take 1 tablet (5 mg total) by mouth daily.  . Multiple Vitamins-Minerals (MULTI FOR HER 50+) CAPS Take 1 capsule by mouth daily.  [name] DELICA LANCETS 51O MISC Use to check BG 1 to 2 times daily  . pravastatin (PRAVACHOL) 40 MG tablet TAKE 1 TABLET (40 MG TOTAL) BY MOUTH EVERY EVENING.    . TRADJENTA 5 MG TABS tablet TAKE 1 TABLET (5 MG TOTAL) BY MOUTH DAILY.  Marland Kitchen zolpidem (AMBIEN) 10 MG tablet TAKE 1 TABLET AT BEDTIME AS NEEDED      Hypertension  This is a chronic problem. The current episode started more than 1 year ago. The problem is unchanged. The problem is controlled. Pertinent negatives include no chest pain, neck pain, palpitations or shortness of breath. Risk factors for coronary artery disease include dyslipidemia, obesity and post-menopausal state. Past treatments include beta blockers and diuretics. The current treatment provides moderate improvement. Compliance problems include diet and exercise.  Hypertensive end-organ damage includes a thyroid problem.  Hyperlipidemia  This is a chronic problem. The current episode started more than 1 year ago. Recent lipid tests were reviewed and are variable. Pertinent negatives include no chest pain or shortness of breath. She is currently on no antihyperlipidemic treatment. The current treatment provides mild improvement of lipids. Compliance problems include adherence to diet and adherence to exercise.  Risk factors for coronary artery disease include dyslipidemia, hypertension and obesity.  Thyroid Problem  Visit type: hypothyroidism- patient sees endocrinologist. Patient reports no palpitations. Her past medical history is significant for hyperlipidemia.  Diabetes  She presents for her follow-up diabetic visit. She has type 2 diabetes mellitus. No MedicAlert identification noted. Her disease course has been fluctuating (  last Hgba1c was 8.2%). There are no hypoglycemic associated symptoms. Pertinent negatives for diabetes include no chest pain. There are no hypoglycemic complications. Symptoms are stable. There are no diabetic complications. Current diabetic treatment includes oral agent (dual therapy) (has been on trulicity for several months.). She is compliant with treatment some of the time. Her weight is stable. When asked  about meal planning, she reported none. She has not had a previous visit with a dietitian. She rarely participates in exercise. Her breakfast blood glucose is taken between 8-9 am. Her breakfast blood glucose range is generally 180-200 mg/dl. Her highest blood glucose is >200 mg/dl. Her overall blood glucose range is 130-140 mg/dl. An ACE inhibitor/angiotensin II receptor blocker is being taken. She does not see a podiatrist.Eye exam is not current.  insomnia ambien 10 qhs- sleeps well- uses CPAP nightly- feels rested in mornings GAD/Depression Currently on lexapro- has been taking for awhile- no side effects GERD Dexilant keeps symptoms under control- thsi is the only med that has worked for her sympytoms. Migraine imitrex as needed- has not had one since February of 2015.   Review of Systems  Constitutional: Negative.   HENT: Negative.   Respiratory: Negative for shortness of breath.   Cardiovascular: Negative for chest pain and palpitations.  Genitourinary: Negative.   Musculoskeletal: Negative for neck pain.  Neurological: Negative.   Psychiatric/Behavioral: Negative.   All other systems reviewed and are negative.      Objective:   Physical Exam  Constitutional: She is oriented to person, place, and time. She appears well-developed and well-nourished.  HENT:  Nose: Nose normal.  Mouth/Throat: Oropharynx is clear and moist.  Eyes: EOM are normal.  Neck: Trachea normal, normal range of motion and full passive range of motion without pain. Neck supple. No JVD present. Carotid bruit is not present. No thyromegaly present.  Cardiovascular: Normal rate, regular rhythm, normal heart sounds and intact distal pulses.  Exam reveals no gallop and no friction rub.   No murmur heard. Pulmonary/Chest: Effort normal and breath sounds normal.  Abdominal: Soft. Bowel sounds are normal. She exhibits no distension and no mass. There is no tenderness.  Musculoskeletal: Normal range of motion.   Lymphadenopathy:    She has no cervical adenopathy.  Neurological: She is alert and oriented to person, place, and time. She has normal reflexes.  Skin: Skin is warm and dry.  Erythematous macular lesion 2cm right forearm  Psychiatric: She has a normal mood and affect. Her behavior is normal. Judgment and thought content normal.   BP 132/76   Pulse 64   Temp 98 F (36.7 C) (Oral)   Ht '5\' 5"'  (1.651 m)   Wt 269 lb (122 kg)   LMP 05/24/1986 (Approximate)   BMI 44.76 kg/m   hgba1c 8.0 down from 8.6 at last visit    Assessment & Plan:  1. Hyperlipidemia, unspecified hyperlipidemia type Low fat diet - Lipid panel  2. Essential hypertension Do not add salt to diet - CMP14+EGFR  3. Hypothyroidism due to medication  4. Recurrent major depressive disorder, in full remission ([place]) stress management  5. GAD (generalized anxiety disorder) Stress management  6. Insomnia, unspecified type Bedtime ritual  7. Severe obesity (BMI >= 40) (HCC) Discussed diet and exercise for person with BMI >25 Will recheck weight in 3-6 months  8. Type 2 diabetes mellitus without complication, without long-term current use of insulin (HCC) Stricter carb counting - Bayer DCA Hb A1c Waived - Dulaglutide (TRULICITY) 1.5 WV/3.7TG SOPN;  Inject 1.5 mg into the skin once a week.  Dispense: 18 mL; Refill: 0  9. Gastroesophageal reflux disease without esophagitis Avoid spicy foods Do not eat 2 hours prior to bedtime     Labs pending Health maintenance reviewed Diet and exercise encouraged Continue all meds Follow up  In 3 months   [place], FNP

## 2016-02-27 NOTE — Patient Instructions (Signed)

## 2016-02-28 LAB — CMP14+EGFR
ALK PHOS: 77 IU/L (ref 39–117)
ALT: 12 IU/L (ref 0–32)
AST: 12 IU/L (ref 0–40)
Albumin/Globulin Ratio: 2.2 (ref 1.2–2.2)
Albumin: 3.7 g/dL (ref 3.6–4.8)
BUN/Creatinine Ratio: 16 (ref 12–28)
BUN: 14 mg/dL (ref 8–27)
Bilirubin Total: 0.4 mg/dL (ref 0.0–1.2)
CALCIUM: 8.7 mg/dL (ref 8.7–10.3)
CO2: 28 mmol/L (ref 18–29)
CREATININE: 0.85 mg/dL (ref 0.57–1.00)
Chloride: 97 mmol/L (ref 96–106)
GFR calc Af Amer: 86 mL/min/{1.73_m2} (ref >59)
GFR, EST NON AFRICAN AMERICAN: 74 mL/min/{1.73_m2} (ref >59)
GLUCOSE: 196 mg/dL — AB (ref 65–99)
Globulin, Total: 1.7 g/dL (ref 1.5–4.5)
Potassium: 3.9 mmol/L (ref 3.5–5.2)
Sodium: 140 mmol/L (ref 134–144)
Total Protein: 5.4 g/dL — ABNORMAL LOW (ref 6.0–8.5)

## 2016-02-28 LAB — LIPID PANEL
CHOL/HDL RATIO: 5.3 ratio — AB (ref 0.0–4.4)
CHOLESTEROL TOTAL: 174 mg/dL (ref 100–199)
HDL: 33 mg/dL — AB (ref >39)
LDL CALC: 102 mg/dL — AB (ref 0–99)
TRIGLYCERIDES: 194 mg/dL — AB (ref 0–149)
VLDL CHOLESTEROL CAL: 39 mg/dL (ref 5–40)

## 2016-03-21 ENCOUNTER — Other Ambulatory Visit: Payer: Self-pay | Admitting: Nurse Practitioner

## 2016-03-21 DIAGNOSIS — E119 Type 2 diabetes mellitus without complications: Secondary | ICD-10-CM

## 2016-04-09 ENCOUNTER — Other Ambulatory Visit: Payer: Self-pay | Admitting: Nurse Practitioner

## 2016-04-09 DIAGNOSIS — F411 Generalized anxiety disorder: Secondary | ICD-10-CM

## 2016-04-09 DIAGNOSIS — G4700 Insomnia, unspecified: Secondary | ICD-10-CM

## 2016-04-12 NOTE — Telephone Encounter (Signed)
[facility] 

## 2016-04-12 NOTE — Telephone Encounter (Signed)
Please call in zolpidem and xanax with 1 refill each

## 2016-05-21 ENCOUNTER — Other Ambulatory Visit: Payer: Self-pay | Admitting: Nurse Practitioner

## 2016-05-21 DIAGNOSIS — E119 Type 2 diabetes mellitus without complications: Secondary | ICD-10-CM

## 2016-05-21 DIAGNOSIS — E785 Hyperlipidemia, unspecified: Secondary | ICD-10-CM

## 2016-05-28 ENCOUNTER — Encounter: Payer: Self-pay | Admitting: Nurse Practitioner

## 2016-05-28 ENCOUNTER — Other Ambulatory Visit: Payer: Self-pay | Admitting: *Deleted

## 2016-05-28 ENCOUNTER — Ambulatory Visit (INDEPENDENT_AMBULATORY_CARE_PROVIDER_SITE_OTHER): Payer: BLUE CROSS/BLUE SHIELD | Admitting: Nurse Practitioner

## 2016-05-28 VITALS — BP 130/72 | HR 63 | Temp 97.5°F | Ht 65.0 in | Wt 248.0 lb

## 2016-05-28 DIAGNOSIS — K219 Gastro-esophageal reflux disease without esophagitis: Secondary | ICD-10-CM | POA: Diagnosis not present

## 2016-05-28 DIAGNOSIS — E782 Mixed hyperlipidemia: Secondary | ICD-10-CM

## 2016-05-28 DIAGNOSIS — F3342 Major depressive disorder, recurrent, in full remission: Secondary | ICD-10-CM | POA: Diagnosis not present

## 2016-05-28 DIAGNOSIS — Z1212 Encounter for screening for malignant neoplasm of rectum: Secondary | ICD-10-CM

## 2016-05-28 DIAGNOSIS — G4733 Obstructive sleep apnea (adult) (pediatric): Secondary | ICD-10-CM | POA: Diagnosis not present

## 2016-05-28 DIAGNOSIS — E032 Hypothyroidism due to medicaments and other exogenous substances: Secondary | ICD-10-CM

## 2016-05-28 DIAGNOSIS — E1165 Type 2 diabetes mellitus with hyperglycemia: Secondary | ICD-10-CM | POA: Diagnosis not present

## 2016-05-28 DIAGNOSIS — I10 Essential (primary) hypertension: Secondary | ICD-10-CM

## 2016-05-28 DIAGNOSIS — F411 Generalized anxiety disorder: Secondary | ICD-10-CM | POA: Diagnosis not present

## 2016-05-28 DIAGNOSIS — Z1211 Encounter for screening for malignant neoplasm of colon: Secondary | ICD-10-CM

## 2016-05-28 DIAGNOSIS — F5101 Primary insomnia: Secondary | ICD-10-CM

## 2016-05-28 DIAGNOSIS — G4700 Insomnia, unspecified: Secondary | ICD-10-CM | POA: Diagnosis not present

## 2016-05-28 DIAGNOSIS — E785 Hyperlipidemia, unspecified: Secondary | ICD-10-CM | POA: Diagnosis not present

## 2016-05-28 LAB — BAYER DCA HB A1C WAIVED: HB A1C (BAYER DCA - WAIVED): 8.2 % — ABNORMAL HIGH (ref <7.0)

## 2016-05-28 MED ORDER — LINAGLIPTIN 5 MG PO TABS: 5.0000 mg | ORAL_TABLET | Freq: Every day | ORAL | 5 refills | Status: DC

## 2016-05-28 MED ORDER — ATENOLOL 100 MG PO TABS: 100.0000 mg | ORAL_TABLET | Freq: Every day | ORAL | 1 refills | Status: DC

## 2016-05-28 MED ORDER — INSULIN DEGLUDEC 100 UNIT/ML ~~LOC~~ SOPN: 35.0000 [IU] | PEN_INJECTOR | Freq: Every day | SUBCUTANEOUS | 5 refills | Status: DC

## 2016-05-28 MED ORDER — INSULIN DEGLUDEC 100 UNIT/ML ~~LOC~~ SOPN: 40.0000 [IU] | PEN_INJECTOR | Freq: Every day | SUBCUTANEOUS | 5 refills | Status: DC

## 2016-05-28 MED ORDER — DULAGLUTIDE 1.5 MG/0.5ML ~~LOC~~ SOAJ: 1.5000 mg | SUBCUTANEOUS | 0 refills | Status: DC

## 2016-05-28 MED ORDER — ZOLPIDEM TARTRATE 10 MG PO TABS: 10.0000 mg | ORAL_TABLET | Freq: Every evening | ORAL | 1 refills | Status: DC | PRN

## 2016-05-28 MED ORDER — HYDROCHLOROTHIAZIDE 25 MG PO TABS: ORAL_TABLET | ORAL | 1 refills | Status: DC

## 2016-05-28 MED ORDER — CLONAZEPAM 0.5 MG PO TABS: 0.5000 mg | ORAL_TABLET | Freq: Two times a day (BID) | ORAL | 1 refills | Status: DC | PRN

## 2016-05-28 MED ORDER — DEXLANSOPRAZOLE 60 MG PO CPDR: DELAYED_RELEASE_CAPSULE | ORAL | 1 refills | Status: DC

## 2016-05-28 MED ORDER — LEVOTHYROXINE SODIUM 200 MCG PO TABS: ORAL_TABLET | ORAL | 0 refills | Status: DC

## 2016-05-28 MED ORDER — ESCITALOPRAM OXALATE 20 MG PO TABS: 20.0000 mg | ORAL_TABLET | Freq: Every day | ORAL | 1 refills | Status: DC

## 2016-05-28 MED ORDER — PRAVASTATIN SODIUM 40 MG PO TABS: ORAL_TABLET | ORAL | 1 refills | Status: DC

## 2016-05-28 MED ORDER — LEVOTHYROXINE SODIUM 200 MCG PO TABS: 200.0000 ug | ORAL_TABLET | Freq: Every day | ORAL | 1 refills | Status: DC

## 2016-05-28 NOTE — Progress Notes (Signed)
Subjective:    Patient ID: [name], female    DOB: [birth year], 61 y.o.   MRN: [id number]   Patient here today for follow up of chronic medical problems. Denies any changes since last visit. No complaints today.  Outpatient Encounter Prescriptions as of 02/27/2016  Medication Sig  . aspirin EC 81 MG tablet Take 1 tablet (81 mg total) by mouth daily.  Marland Kitchen atenolol (TENORMIN) 100 MG tablet Take 1 tablet (100 mg total) by mouth daily.  . Calcium Carbonate-Vitamin D (CALCIUM 600 + D PO) Take 1 tablet by mouth daily.   . clonazePAM (KLONOPIN) 0.5 MG tablet TAKE 1 TABLET BY MOUTH TWICE A DAY AS NEEDED  . DEXILANT 60 MG capsule TAKE 1 CAPSULE (60 MG TOTAL) BY MOUTH 2 (TWO) TIMES DAILY.  . Dulaglutide (TRULICITY) 1.5 VW/9.7XY SOPN Inject 1.5 mg into the skin once a week.  . escitalopram (LEXAPRO) 20 MG tablet Take 1 tablet (20 mg total) by mouth daily.  Marland Kitchen glucose blood (ONETOUCH VERIO) test strip USE TO CHECK BG 1 TO 2 TIMES DAILY  . hydrochlorothiazide (HYDRODIURIL) 25 MG tablet TAKE 1 TABLET (25 MG TOTAL) BY MOUTH DAILY.  Marland Kitchen Insulin Degludec (TRESIBA FLEXTOUCH) 100 UNIT/ML SOPN Inject 20 Units into the skin at bedtime and may repeat dose one time if needed.  . Insulin Pen Needle (PEN NEEDLES) 31G X 5 MM MISC 1 each by Does not apply route daily. Duffy Bruce tresiba injection  Dx E11.9  . levothyroxine (SYNTHROID, LEVOTHROID) 175 MCG tablet Take 1 tablet (175 mcg total) by mouth daily. Except Sunday.  Takes 200 mg tab on Sunday.  . levothyroxine (SYNTHROID, LEVOTHROID) 200 MCG tablet Take 1 tablet (200 mcg total) by mouth daily before breakfast. Take 1 pill on Sunday  . linagliptin (TRADJENTA) 5 MG TABS tablet Take 1 tablet (5 mg total) by mouth daily.  . Multiple Vitamins-Minerals (MULTI FOR HER 50+) CAPS Take 1 capsule by mouth daily.  [name] DELICA LANCETS 80X MISC Use to check BG 1 to 2 times daily  . pravastatin (PRAVACHOL) 40 MG tablet TAKE 1 TABLET (40 MG TOTAL) BY MOUTH EVERY EVENING.      Marland Kitchen zolpidem (AMBIEN) 10 MG tablet TAKE 1 TABLET AT BEDTIME AS NEEDED    Hypertension  This is a chronic problem. The current episode started more than 1 year ago. The problem is unchanged. The problem is controlled. Pertinent negatives include no chest pain, neck pain, palpitations or shortness of breath. Risk factors for coronary artery disease include dyslipidemia, obesity and post-menopausal state. Past treatments include beta blockers and diuretics. The current treatment provides moderate improvement. Compliance problems include diet and exercise.  Hypertensive end-organ damage includes a thyroid problem.  Hyperlipidemia  This is a chronic problem. The current episode started more than 1 year ago. Recent lipid tests were reviewed and are variable. Pertinent negatives include no chest pain or shortness of breath. She is currently on no antihyperlipidemic treatment. The current treatment provides mild improvement of lipids. Compliance problems include adherence to diet and adherence to exercise.  Risk factors for coronary artery disease include dyslipidemia, hypertension and obesity.  Thyroid Problem  Visit type: hypothyroidism- patient sees endocrinologist. Patient reports no palpitations. Her past medical history is significant for hyperlipidemia.  Diabetes  She presents for her follow-up diabetic visit. She has type 2 diabetes mellitus. No MedicAlert identification noted. Her disease course has been fluctuating (last Hgba1c was 8.2%). There are no hypoglycemic associated symptoms. Pertinent negatives for diabetes include no  chest pain. There are no hypoglycemic complications. Symptoms are stable. There are no diabetic complications. Current diabetic treatment includes oral agent (dual therapy) (has been on trulicity for several months.). She is compliant with treatment some of the time. Her weight is stable. When asked about meal planning, she reported none. She has not had a previous visit with a  dietitian. She rarely participates in exercise. Her breakfast blood glucose is taken between 8-9 am. Her breakfast blood glucose range is generally 180-200 mg/dl. Her highest blood glucose is >200 mg/dl. Her overall blood glucose range is 130-140 mg/dl. An ACE inhibitor/angiotensin II receptor blocker is being taken. She does not see a podiatrist.Eye exam is not current.  insomnia ambien 10 qhs- sleeps well- uses CPAP nightly- feels rested in mornings GAD/Depression Currently on lexapro- has been taking for awhile- no side effects. Depression screen [facility] 2/9 05/28/2016 02/27/2016 11/28/2015 10/17/2015 08/29/2015  Decreased Interest 0 0 0 0 0  Down, Depressed, Hopeless 0 0 0 0 0  PHQ - 2 Score 0 0 0 0 0  GERD Dexilant keeps symptoms under control- thsi is the only med that has worked for her sympytoms. Migraine imitrex as needed- has not had one since February of 2015.   Review of Systems  Constitutional: Negative.   HENT: Negative.   Respiratory: Negative for shortness of breath.   Cardiovascular: Negative for chest pain and palpitations.  Genitourinary: Negative.   Musculoskeletal: Negative for neck pain.  Neurological: Negative.   Psychiatric/Behavioral: Negative.   All other systems reviewed and are negative.      Objective:   Physical Exam  Constitutional: She is oriented to person, place, and time. She appears well-developed and well-nourished.  HENT:  Nose: Nose normal.  Mouth/Throat: Oropharynx is clear and moist.  Eyes: EOM are normal.  Neck: Trachea normal, normal range of motion and full passive range of motion without pain. Neck supple. No JVD present. Carotid bruit is not present. No thyromegaly present.  Cardiovascular: Normal rate, regular rhythm, normal heart sounds and intact distal pulses.  Exam reveals no gallop and no friction rub.   No murmur heard. Pulmonary/Chest: Effort normal and breath sounds normal.  Abdominal: Soft. Bowel sounds are normal. She exhibits no  distension and no mass. There is no tenderness.  Musculoskeletal: Normal range of motion.  Lymphadenopathy:    She has no cervical adenopathy.  Neurological: She is alert and oriented to person, place, and time. She has normal reflexes.  Skin: Skin is warm and dry.  Erythematous macular lesion 2cm right forearm  Psychiatric: She has a normal mood and affect. Her behavior is normal. Judgment and thought content normal.   LMP 05/24/1986 (Approximate)   BP 130/72   Pulse 63   Temp 97.5 F (36.4 C) (Oral)   Ht _0  (1.651 m)   Wt 248 lb (112.5 kg)   LMP 05/24/1986 (Approximate)   BMI 41.27 kg/m   .hgab1c 8.2% up from 8.0%   Assessment & Plan:  1. Hyperlipidemia, unspecified hyperlipidemia type Low fat diet - Lipid panel - pravastatin (PRAVACHOL) 40 MG tablet; TAKE 1 TABLET (40 MG TOTAL) BY MOUTH EVERY EVENING.  Dispense: 90 tablet; Refill: 1  2. Essential hypertension Low sodium diet - CMP14+EGFR - atenolol (TENORMIN) 100 MG tablet; Take 1 tablet (100 mg total) by mouth daily.  Dispense: 90 tablet; Refill: 1 - hydrochlorothiazide (HYDRODIURIL) 25 MG tablet; TAKE 1 TABLET (25 MG TOTAL) BY MOUTH DAILY.  Dispense: 90 tablet; Refill: 1  3. Type  2 diabetes mellitus with hyperglycemia, without long-term current use of insulin (HCC) Strict carb counting Increased tresiba to 40u - Bayer DCA Hb A1c Waived - linagliptin (TRADJENTA) 5 MG TABS tablet; Take 1 tablet (5 mg total) by mouth daily.  Dispense: 30 tablet; Refill: 5 - Dulaglutide (TRULICITY) 1.5 WV/1.4CJ SOPN; Inject 1.5 mg into the skin once a week.  Dispense: 18 mL; Refill: 0 - insulin degludec (TRESIBA) 100 UNIT/ML SOPN FlexTouch Pen; Inject 0.4 mLs (40 Units total) into the skin daily at 10 pm.  Dispense: 15 mL; Refill: 5  4. OSA (obstructive sleep apnea) Continue to wear CPAP nightly  5. Gastroesophageal reflux disease without esophagitis Avoid spicy foods Do not eat 2 hours prior to bedtime - dexlansoprazole  (DEXILANT) 60 MG capsule; TAKE 1 CAPSULE (60 MG TOTAL) BY MOUTH 2 (TWO) TIMES DAILY.  Dispense: 180 capsule; Refill: 1  6. Hypothyroidism due to medication - levothyroxine (SYNTHROID, LEVOTHROID) 200 MCG tablet; Take 1 tablet (200 mcg total) by mouth daily before breakfast. Take 1 pill on Sunday  Dispense: 90 tablet; Refill: 1  7. Recurrent major depressive disorder, in full remission ([place]) Stress manegement - escitalopram (LEXAPRO) 20 MG tablet; Take 1 tablet (20 mg total) by mouth daily.  Dispense: 90 tablet; Refill: 1  8. GAD (generalized anxiety disorder) - clonazePAM (KLONOPIN) 0.5 MG tablet; Take 1 tablet (0.5 mg total) by mouth 2 (two) times daily as needed.  Dispense: 60 tablet; Refill: 1  9. Severe obesity (BMI >= 40) (HCC) Discussed diet and exercise for person with BMI >25 Will recheck weight in 3-6 months  10. Encounter for colorectal cancer screening - Fecal occult blood, imunochemical; Future   11. Primary insomnia Bedtime routine - zolpidem (AMBIEN) 10 MG tablet; Take 1 tablet (10 mg total) by mouth at bedtime as needed.  Dispense: 30 tablet; Refill: 1    Labs pending Health maintenance reviewed Diet and exercise encouraged Continue all meds Follow up  In 3 months   [place], FNP

## 2016-05-28 NOTE — Patient Instructions (Signed)

## 2016-05-28 NOTE — Addendum Note (Signed)
Addended by: Liliane Bade on: 05/28/2016 12:53 PM   Modules accepted: Orders

## 2016-05-29 LAB — CMP14+EGFR
A/G RATIO: 2.2 (ref 1.2–2.2)
ALT: 12 IU/L (ref 0–32)
AST: 15 IU/L (ref 0–40)
Albumin: 3.9 g/dL (ref 3.6–4.8)
Alkaline Phosphatase: 86 IU/L (ref 39–117)
BILIRUBIN TOTAL: 0.3 mg/dL (ref 0.0–1.2)
BUN/Creatinine Ratio: 21 (ref 12–28)
BUN: 16 mg/dL (ref 8–27)
CALCIUM: 9.2 mg/dL (ref 8.7–10.3)
CHLORIDE: 98 mmol/L (ref 96–106)
CO2: 24 mmol/L (ref 18–29)
Creatinine, Ser: 0.75 mg/dL (ref 0.57–1.00)
GFR calc non Af Amer: 86 mL/min/{1.73_m2} (ref >59)
GFR, EST AFRICAN AMERICAN: 99 mL/min/{1.73_m2} (ref >59)
GLOBULIN, TOTAL: 1.8 g/dL (ref 1.5–4.5)
Glucose: 172 mg/dL — ABNORMAL HIGH (ref 65–99)
POTASSIUM: 4.1 mmol/L (ref 3.5–5.2)
SODIUM: 142 mmol/L (ref 134–144)
TOTAL PROTEIN: 5.7 g/dL — AB (ref 6.0–8.5)

## 2016-05-29 LAB — LIPID PANEL
Chol/HDL Ratio: 5.3 ratio units — ABNORMAL HIGH (ref 0.0–4.4)
Cholesterol, Total: 171 mg/dL (ref 100–199)
HDL: 32 mg/dL — ABNORMAL LOW (ref >39)
LDL Calculated: 101 mg/dL — ABNORMAL HIGH (ref 0–99)
Triglycerides: 190 mg/dL — ABNORMAL HIGH (ref 0–149)
VLDL Cholesterol Cal: 38 mg/dL (ref 5–40)

## 2016-05-29 LAB — FECAL OCCULT BLOOD, IMMUNOCHEMICAL: FECAL OCCULT BLD: POSITIVE — AB

## 2016-06-01 ENCOUNTER — Other Ambulatory Visit: Payer: BLUE CROSS/BLUE SHIELD

## 2016-06-01 DIAGNOSIS — Z1211 Encounter for screening for malignant neoplasm of colon: Secondary | ICD-10-CM

## 2016-06-01 DIAGNOSIS — Z1212 Encounter for screening for malignant neoplasm of rectum: Principal | ICD-10-CM

## 2016-06-02 LAB — FECAL OCCULT BLOOD, IMMUNOCHEMICAL: Fecal Occult Bld: NEGATIVE

## 2016-06-10 ENCOUNTER — Telehealth: Payer: Self-pay | Admitting: Pharmacist

## 2016-06-10 NOTE — Telephone Encounter (Signed)
Called to follow up with patient regarding BG.  She was in to see PCP about 2 weeks ago.  A1c was 8.3%.  At that time Tyler Aas was increased from 30 to 40 units once a day.  Patient reports that fasting BG has decreased some since increase in [place] to 180's to 190's in am.   Discussed FBG goal of 80 to 130.  She is instructed to continue to check BG at least once daily  IF FBG continues to remain over 130 in am she is to call office for adjustment in insulin.

## 2016-06-11 ENCOUNTER — Telehealth: Payer: Self-pay | Admitting: Nurse Practitioner

## 2016-06-11 NOTE — Telephone Encounter (Signed)
Please advise 

## 2016-06-14 ENCOUNTER — Other Ambulatory Visit: Payer: Self-pay | Admitting: Nurse Practitioner

## 2016-06-14 ENCOUNTER — Telehealth: Payer: Self-pay | Admitting: Pulmonary Disease

## 2016-06-14 MED ORDER — NYSTATIN 100000 UNIT/ML MT SUSP: 5.0000 mL | Freq: Four times a day (QID) | OROMUCOSAL | 0 refills | Status: DC

## 2016-06-14 NOTE — Telephone Encounter (Signed)
[place]

## 2016-06-14 NOTE — Telephone Encounter (Signed)
rx sent to pharmacy

## 2016-06-14 NOTE — Telephone Encounter (Signed)
AD has several openings tomorrow (06/15/2016).. Pt has been scheduled to see AD tomorrow at 12:00.  Nothing further needed.

## 2016-06-15 ENCOUNTER — Ambulatory Visit (INDEPENDENT_AMBULATORY_CARE_PROVIDER_SITE_OTHER)
Admission: RE | Admit: 2016-06-15 | Discharge: 2016-06-15 | Disposition: A | Discharge status: Home / Self Care | Payer: BLUE CROSS/BLUE SHIELD | Source: Ambulatory Visit | Attending: Pulmonary Disease | Admitting: Pulmonary Disease

## 2016-06-15 ENCOUNTER — Encounter: Payer: Self-pay | Admitting: Pulmonary Disease

## 2016-06-15 ENCOUNTER — Ambulatory Visit (INDEPENDENT_AMBULATORY_CARE_PROVIDER_SITE_OTHER): Payer: BLUE CROSS/BLUE SHIELD | Admitting: Pulmonary Disease

## 2016-06-15 ENCOUNTER — Other Ambulatory Visit (INDEPENDENT_AMBULATORY_CARE_PROVIDER_SITE_OTHER): Payer: BLUE CROSS/BLUE SHIELD

## 2016-06-15 VITALS — BP 122/80 | HR 66 | Ht 65.0 in | Wt 248.0 lb

## 2016-06-15 DIAGNOSIS — J40 Bronchitis, not specified as acute or chronic: Secondary | ICD-10-CM | POA: Diagnosis not present

## 2016-06-15 DIAGNOSIS — G4733 Obstructive sleep apnea (adult) (pediatric): Secondary | ICD-10-CM | POA: Diagnosis not present

## 2016-06-15 DIAGNOSIS — K219 Gastro-esophageal reflux disease without esophagitis: Secondary | ICD-10-CM

## 2016-06-15 DIAGNOSIS — J209 Acute bronchitis, unspecified: Secondary | ICD-10-CM | POA: Diagnosis not present

## 2016-06-15 LAB — BASIC METABOLIC PANEL
BUN: 15 mg/dL (ref 6–23)
CALCIUM: 9.4 mg/dL (ref 8.4–10.5)
CO2: 34 meq/L — AB (ref 19–32)
CREATININE: 0.86 mg/dL (ref 0.40–1.20)
Chloride: 98 mEq/L (ref 96–112)
GFR: 71.2 mL/min (ref >60.00)
GLUCOSE: 176 mg/dL — AB (ref 70–99)
Potassium: 4 mEq/L (ref 3.5–5.1)
Sodium: 138 mEq/L (ref 135–145)

## 2016-06-15 LAB — CBC WITH DIFFERENTIAL/PLATELET
Basophils Absolute: 0.1 10*3/uL (ref 0.0–0.1)
Basophils Relative: 0.8 % (ref 0.0–3.0)
EOS PCT: 2.8 % (ref 0.0–5.0)
Eosinophils Absolute: 0.5 10*3/uL (ref 0.0–0.7)
HCT: 34.2 % — ABNORMAL LOW (ref 36.0–46.0)
HEMOGLOBIN: 10.8 g/dL — AB (ref 12.0–15.0)
LYMPHS ABS: 5.9 10*3/uL — AB (ref 0.7–4.0)
Lymphocytes Relative: 36.1 % (ref 12.0–46.0)
MCHC: 31.6 g/dL (ref 30.0–36.0)
MCV: 71.2 fl — AB (ref 78.0–100.0)
MONO ABS: 0.6 10*3/uL (ref 0.1–1.0)
MONOS PCT: 3.9 % (ref 3.0–12.0)
NEUTROS PCT: 56.4 % (ref 43.0–77.0)
Neutro Abs: 9.2 10*3/uL — ABNORMAL HIGH (ref 1.4–7.7)
Platelets: 306 10*3/uL (ref 150.0–400.0)
RBC: 4.81 Mil/uL (ref 3.87–5.11)
RDW: 18.3 % — AB (ref 11.5–15.5)
WBC: 16.3 10*3/uL — ABNORMAL HIGH (ref 4.0–10.5)

## 2016-06-15 MED ORDER — PREDNISONE 10 MG PO TABS: 20.0000 mg | ORAL_TABLET | Freq: Every day | ORAL | 0 refills | Status: AC

## 2016-06-15 MED ORDER — FLUTICASONE PROPIONATE 50 MCG/ACT NA SUSP: 2.0000 | Freq: Every day | NASAL | 2 refills | Status: DC

## 2016-06-15 NOTE — Patient Instructions (Addendum)
It was a pleasure taking care of you today!  You are diagnosed with bronchitis.  We will obtain blood work (CBC, BMP) and Chest Xray.  Start Prednisone 20 mg/tab, 1 tablet daily for 5 days. Start Flonase, 2 squirts per nostril at bedtime.  Please call the office if you are having adverse reaction to meds/antibiotics.  Please call the office your symptoms are getting worse despite the meds/antibiotics.   Return to clinic as scheduled.

## 2016-06-15 NOTE — Assessment & Plan Note (Signed)
No isues with cpap. Feels better with cpap. Cont cpap use.

## 2016-06-15 NOTE — Assessment & Plan Note (Signed)
Stable. Cont PPI BID. Asp precautions.

## 2016-06-15 NOTE — Assessment & Plan Note (Signed)
Recent upper respiratory tract infection with nasal congestion, cough, hoarseness. No fevers no chills. Reflux is stable.  Plan : 1. CXR to R/O PNA. Less likely PNA but will be thorough. Patient lives an hour away. We will do blood work as well with CBC and a basic metabolic profile. Hold off on antibiotics pending blood work and chest x-ray. 2. Prednisone, 20 mg a day for 5 days. 3. Start Flonase, 2 squirts per nostril at bedtime. 4. Continue Dexilant, twice a day.

## 2016-06-15 NOTE — Progress Notes (Signed)
Subjective:    Patient ID: [name], female    DOB: [birth year], 61 y.o.   MRN: [id number]  HPI Patient has sleep apnea and cough related to reflux.  Patient is here urgently for cough. It started last week. She had nasal congestion and PND but better. No fevers, chills.  Has mild flare up of her GERD as well.  Went to urgent care and was given mouth swish.  Sx are persistent, cough is worse.   She takes Dexilant BID.  She uses Nyquil at bedtime which controls the cough.  (-) sick contacts.  Uses cpap. No issues with it.   No new meds prior to cough.      Review of Systems  Constitutional: Negative.   HENT: Negative.   Eyes: Negative.   Respiratory: Positive for cough and shortness of breath.   Cardiovascular: Negative.   Gastrointestinal: Negative.   Endocrine: Negative.   Genitourinary: Negative.   Musculoskeletal: Negative.   Skin: Negative.   Allergic/Immunologic: Negative.   Neurological: Negative.   Hematological: Negative.   Psychiatric/Behavioral: Negative.   All other systems reviewed and are negative.  Past Medical History:  Diagnosis Date  . Abnormal Pap smear of cervix 1988   prior to hysterectomy  . Anxiety   . Arthritis 2015   spinal stenosis - Dr. Nelva Bush.  Epidural steroids  . Basal cell carcinoma of face 2008   Left  . Depression   . Diabetes mellitus without complication ([place])   . Exposure to TB    Treated x 6 months  . GERD (gastroesophageal reflux disease)   . Hiatal hernia   . Hyperlipidemia    diet controlled  . Hyperplastic colonic polyp 2003 & 2008   #3 polyps first, 1 second time  . Hypertension   . IBS (irritable bowel syndrome)   . Migraines   . OSA (obstructive sleep apnea) 07/25/2012   C- pap  . 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 Skin cancer    basal cell and 1 squamous cell on face and neck  . Thyroid disease 2011   hyperthyroid and treated with radio active iodine - now hypothyroid  . Ulcer ([place]) 1982   gastric     Family History  Problem Relation Age of Onset  . Adopted: Yes  . Lung cancer Mother   . Lung cancer Maternal Aunt   . Stroke Maternal Grandfather   . Diabetes Maternal Grandfather      Past Surgical History:  Procedure Laterality Date  . COLONOSCOPY    . MOHS SURGERY  2006   left side of face  . POLYPECTOMY  colon  . THYROID SURGERY  2011   radioactive  . VAGINAL HYSTERECTOMY  1988   DUB and endometriosis    Social History   Social History  . Marital status: Married    Spouse name: N/A  . Number of children: N/A  . Years of education: N/A   Occupational History  . retired Geologist, engineering    Social History Main Topics  . Smoking status: Never Smoker  . Smokeless tobacco: Never Used     Comment: socially  . Alcohol use No  . Drug use: No  . Sexual activity: Yes    Partners: Male    Birth control/ protection: Surgical     Comment: hysterectomy   Other Topics Concern  . Not on file   Social History Narrative  . No narrative on file  Allergies  Allergen Reactions  . Metformin And Related Diarrhea    severe diarrhea  . Lipitor [Atorvastatin Calcium]     myalgia  . Statins     Myalgia   . Zocor [Simvastatin - High Dose]     myalgia     Outpatient Medications Prior to Visit  Medication Sig Dispense Refill  . aspirin EC 81 MG tablet Take 1 tablet (81 mg total) by mouth daily.    Marland Kitchen atenolol (TENORMIN) 100 MG tablet Take 1 tablet (100 mg total) by mouth daily. 90 tablet 1  . Calcium Carbonate-Vitamin D (CALCIUM 600 + D PO) Take 1 tablet by mouth daily.     . clonazePAM (KLONOPIN) 0.5 MG tablet Take 1 tablet (0.5 mg total) by mouth 2 (two) times daily as needed. 60 tablet 1  . dexlansoprazole (DEXILANT) 60 MG capsule TAKE 1 CAPSULE (60 MG TOTAL) BY MOUTH 2 (TWO) TIMES DAILY. 180 capsule 1  . Dulaglutide (TRULICITY) 1.5 0000000 SOPN Inject 1.5 mg into the skin once a week. 18 mL 0  . escitalopram (LEXAPRO) 20 MG tablet Take 1 tablet (20  mg total) by mouth daily. 90 tablet 1  . glucose blood (ONETOUCH VERIO) test strip USE TO CHECK BG 1 TO 2 TIMES DAILY 300 each 0  . hydrochlorothiazide (HYDRODIURIL) 25 MG tablet TAKE 1 TABLET (25 MG TOTAL) BY MOUTH DAILY. 90 tablet 1  . insulin degludec (TRESIBA) 100 UNIT/ML SOPN FlexTouch Pen Inject 0.4 mLs (40 Units total) into the skin daily at 10 pm. 15 mL 5  . Insulin Pen Needle (PEN NEEDLES) 31G X 5 MM MISC 1 each by Does not apply route daily. Duffy Bruce tresiba injection  Dx E11.9 100 each 11  . levothyroxine (SYNTHROID, LEVOTHROID) 175 MCG tablet Take 1 tablet (175 mcg total) by mouth daily. Except Sunday.  Takes 200 mg tab on Sunday. 90 tablet 3  . levothyroxine (SYNTHROID, LEVOTHROID) 200 MCG tablet Take one tablet by mouth on Sundays only 90 tablet 0  . linagliptin (TRADJENTA) 5 MG TABS tablet Take 1 tablet (5 mg total) by mouth daily. 30 tablet 5  . Multiple Vitamins-Minerals (MULTI FOR HER 50+) CAPS Take 1 capsule by mouth daily.    Marland Kitchen nystatin (MYCOSTATIN) 100000 UNIT/ML suspension Take 5 mLs (500,000 Units total) by mouth 4 (four) times daily. 60 mL 0  . ONETOUCH DELICA LANCETS 99991111 MISC Use to check BG 1 to 2 times daily 100 each 3  . pravastatin (PRAVACHOL) 40 MG tablet TAKE 1 TABLET (40 MG TOTAL) BY MOUTH EVERY EVENING. 90 tablet 1  . zolpidem (AMBIEN) 10 MG tablet Take 1 tablet (10 mg total) by mouth at bedtime as needed. 30 tablet 1   No facility-administered medications prior to visit.    Meds ordered this encounter  Medications  . predniSONE (DELTASONE) 10 MG tablet    Sig: Take 2 tablets (20 mg total) by mouth daily.    Dispense:  28 tablet    Refill:  0  . fluticasone (FLONASE) 50 MCG/ACT nasal spray    Sig: Place 2 sprays into both nostrils at bedtime.    Dispense:  16 g    Refill:  2        Objective:   Physical Exam  Vitals:  Vitals:   06/15/16 1217  BP: 122/80  Pulse: 66  SpO2: 96%  Weight: 248 lb (112.5 kg)  Height: 5\' 5"  (1.651 m)     Constitutional/General:  Pleasant, well-nourished, well-developed, not in any distress,  Comfortably seating.  Well kempt  Body mass index is 41.27 kg/m. Wt Readings from Last 3 Encounters:  06/15/16 248 lb (112.5 kg)  05/28/16 248 lb (112.5 kg)  02/27/16 269 lb (122 kg)      HEENT: Pupils equal and reactive to light and accommodation. Anicteric sclerae. Normal nasal mucosa.   No oral  lesions,  mouth clear,  oropharynx clear, no postnasal drip. (-) Oral thrush. No dental caries.  Airway - Mallampati class IV  Neck: No masses. Midline trachea. No JVD, (-) LAD. (-) bruits appreciated.  Respiratory/Chest: Grossly normal chest. (-) deformity. (-) Accessory muscle use.  Symmetric expansion. (-) Tenderness on palpation.  Resonant on percussion.  Diminished BS on both lower lung zones. (-) crackles, rhonchi. Some wheezing in BULF.  (-) egophony  Cardiovascular: Regular rate and  rhythm, heart sounds normal, no murmur or gallops, no peripheral edema  Gastrointestinal:  Normal bowel sounds. Soft, non-tender. No hepatosplenomegaly.  (-) masses.   Musculoskeletal:  Normal muscle tone. Normal gait.   Extremities: Grossly normal. (-) clubbing, cyanosis.  (-) edema  Skin: (-) rash,lesions seen.   Neurological/Psychiatric : alert, oriented to time, place, person. Normal mood and affect          Assessment & Plan:  Acute bronchitis Recent upper respiratory tract infection with nasal congestion, cough, hoarseness. No fevers no chills. Reflux is stable.  Plan : 1. CXR to R/O PNA. Less likely PNA but will be thorough. Patient lives an hour away. We will do blood work as well with CBC and a basic metabolic profile. Hold off on antibiotics pending blood work and chest x-ray. 2. Prednisone, 20 mg a day for 5 days. 3. Start Flonase, 2 squirts per nostril at bedtime. 4. Continue Dexilant, twice a day.  GERD Stable. Cont PPI BID. Asp precautions.   OSA (obstructive sleep  apnea) No isues with cpap. Feels better with cpap. Cont cpap use.     Return to clinic in as scheduled.   Monica Becton, MD 06/15/2016, 12:59 PM [facility] Pager [phone] After 3 pm or if no answer, call [phone]

## 2016-06-16 ENCOUNTER — Ambulatory Visit: Payer: Self-pay | Admitting: Pulmonary Disease

## 2016-06-16 ENCOUNTER — Telehealth: Payer: Self-pay | Admitting: Pulmonary Disease

## 2016-06-16 MED ORDER — AZITHROMYCIN 250 MG PO TABS: ORAL_TABLET | ORAL | 0 refills | Status: DC

## 2016-06-16 NOTE — Telephone Encounter (Signed)
Spoke with pt, per yesterday's OV, AD was waiting on cxr and labs to see if she needed an abx.  Pt states her cough is not improved and was calling to request an abx.  AD per cxr and lab results has already requested a zpak be called in- see below.  zpak sent to preferred pharmacy.  Nothing further needed at this time.    Notes Recorded by Rush Landmark, MD on 06/15/2016 at 11:34 PM EST pls tell the pt the cxr showed bronchitis. Her blood work showed elevated WBC 2/2 bronchitis. pls call in [place]. If not better, she can call back. Thanks.

## 2016-06-25 ENCOUNTER — Other Ambulatory Visit: Payer: Self-pay | Admitting: Family

## 2016-06-25 ENCOUNTER — Encounter: Payer: Self-pay | Admitting: Nurse Practitioner

## 2016-06-28 ENCOUNTER — Ambulatory Visit (INDEPENDENT_AMBULATORY_CARE_PROVIDER_SITE_OTHER): Payer: BLUE CROSS/BLUE SHIELD | Admitting: Nurse Practitioner

## 2016-06-28 ENCOUNTER — Encounter: Payer: Self-pay | Admitting: Nurse Practitioner

## 2016-06-28 ENCOUNTER — Other Ambulatory Visit: Payer: Self-pay | Admitting: Nurse Practitioner

## 2016-06-28 VITALS — BP 134/78 | HR 63 | Temp 97.8°F | Ht 65.0 in | Wt 261.0 lb

## 2016-06-28 DIAGNOSIS — K130 Diseases of lips: Secondary | ICD-10-CM

## 2016-06-28 DIAGNOSIS — B370 Candidal stomatitis: Secondary | ICD-10-CM

## 2016-06-28 MED ORDER — PILOCARPINE HCL 5 MG PO TABS: 5.0000 mg | ORAL_TABLET | Freq: Two times a day (BID) | ORAL | 5 refills | Status: DC

## 2016-06-28 MED ORDER — FLUCONAZOLE 150 MG PO TABS: ORAL_TABLET | ORAL | 0 refills | Status: DC

## 2016-06-28 MED ORDER — MICONAZOLE 50 MG BU TABS: 1.0000 | ORAL_TABLET | Freq: Every day | BUCCAL | 0 refills | Status: DC

## 2016-06-28 NOTE — Progress Notes (Signed)
   Subjective:    Patient ID: [name], female    DOB: July 08, 1955, 61 y.o.   MRN: [id number]  HPI  Patient comes I today c/o dry mouth, with decrease in taste. SHe says that her tongue feels like she has poured hot sauce on it and it is sticking to the roof of her mouth. She noticed some white patches in roof of mouth. She had some nystatin at home which seemed to be helpig but sh eran out.   Review of Systems  Constitutional: Negative for appetite change, chills and fever.  HENT: Positive for mouth sores.   Respiratory: Negative.   Cardiovascular: Negative.   Gastrointestinal: Negative.   Genitourinary: Negative.   Neurological: Negative.   Psychiatric/Behavioral: Negative.   All other systems reviewed and are negative.      Objective:   Physical Exam  Constitutional: She appears well-developed and well-nourished. She appears distressed (mild).  HENT:  Right Ear: External ear normal.  Left Ear: External ear normal.  Nose: Nose normal.  Mouth/Throat: Oropharynx is clear and moist.  Tongue is raw and dry- white patches  In roof of mouth- corners of mouth are cracked open  Pulmonary/Chest: Effort normal and breath sounds normal.  Abdominal: Soft. Bowel sounds are normal.  Neurological: She is alert.  Psychiatric: She has a normal mood and affect. Her behavior is normal. Judgment and thought content normal.   BP 134/78   Pulse 63   Temp 97.8 F (36.6 C) (Oral)   Ht 5\' 5"  (1.651 m)   Wt 261 lb (118.4 kg)   LMP 05/24/1986 (Approximate)   BMI 43.43 kg/m        Assessment & Plan:   1. Thrush   2. Angular cheilitis    Meds ordered this encounter  Medications  . Miconazole 50 MG TABS    Sig: Place 1 tablet (50 mg total) inside cheek daily.    Dispense:  14 tablet    Refill:  0    Order Specific Question:   Supervising Provider    Answer:   VINCENT, CAROL L [[id number]]  . fluconazole (DIFLUCAN) 150 MG tablet    Sig: 1 po q week x 4 weeks    Dispense:  4 tablet      Refill:  0    Order Specific Question:   Supervising Provider    Answer:   Eustaquio Maize [[id number]]   RTO prn  Mary-Margaret Hassell Done, FNP

## 2016-06-28 NOTE — Patient Instructions (Signed)
Oral Thrush, Adult Oral thrush is an infection in your mouth and throat. It causes white patches on your tongue and in your mouth. Follow these instructions at home: Helping with soreness  To lessen your pain: ? Drink cold liquids, like water and iced tea. ? Eat frozen ice pops or frozen juices. ? Eat foods that are easy to swallow, like gelatin and ice cream. ? Drink from a straw if the patches in your mouth are painful. General instructions   Take or use over-the-counter and prescription medicines only as told by your doctor. Medicine for oral thrush may be something to swallow, or it may be something to put on the infected area.  Eat plain yogurt that has live cultures in it. Read the label to make sure.  If you wear dentures: ? Take out your dentures before you go to bed. ? Brush them well. ? Soak them in a denture cleaner.  Rinse your mouth with warm salt-water many times a day. To make the salt-water mixture, completely dissolve 1/2-1 teaspoon of salt in 1 cup of warm water. Contact a doctor if:  Your problems are getting worse.  Your problems do not get better in less than 7 days with treatment.  Your infection is spreading. This may show as white patches on the skin outside of your mouth.  You are nursing your baby and you have redness and pain in the nipples. This information is not intended to replace advice given to you by your health care provider. Make sure you discuss any questions you have with your health care provider. Document Released: 08/04/2009 Document Revised: 02/02/2016 Document Reviewed: 02/02/2016 Elsevier Interactive Patient Education  2017 [employer]  

## 2016-07-12 ENCOUNTER — Encounter: Payer: Self-pay | Admitting: Nurse Practitioner

## 2016-07-12 NOTE — Progress Notes (Signed)
Patient ID: [name], female   DOB: [birth year], 61 y.o.   MRN: [id number]  61 y.o. [id number] Married  Caucasian Fe here for annual exam. Most recent had Z pack 2 weeks ago that give her thrush. Now with dysuria and frequency.  On Diflucan for the thrush.  Last HGB AIC 8.2 -  2 weeks ago.  Patient's last menstrual period was 05/24/1986 (approximate).          Sexually active: Yes.    The current method of family planning is status post hysterectomy.    Exercising: Yes.    walking Smoker:  no  Health Maintenance: Pap: 06/24/14, Negative with neg HR HPV  04/06/10, Negative MMG:06/2016, will call for report Colonoscopy:09/08/11, normal, repeat in 10 years, neg IFOB 06/01/16 BMD: 08/16/12, -0.1 Spine / -0.3 Right Femur Neck / -0.5 Left Femur Neck TDaP: 02/22/2012 Shingles: followed by PCP Pneumonia: 02/22/12, Pneumovax Hep C: 05/13/15 HIV: postponed in EPIC by PCP Labs: PCP takes care of all labs  Urine: 2+ Leuk's, trace protein, 250 glucose   reports that she has never smoked. She has never used smokeless tobacco. She reports that she does not drink alcohol or use drugs.  Past Medical History:  Diagnosis Date  . Abnormal Pap smear of cervix 1988   prior to hysterectomy  . Anxiety   . Arthritis 2015   spinal stenosis - Dr. Nelva Bush.  Epidural steroids  . Basal cell carcinoma of face 2008   Left  . Depression   . Diabetes mellitus without complication ([place])   . Exposure to TB    Treated x 6 months  . GERD (gastroesophageal reflux disease)   . Hiatal hernia   . Hyperlipidemia    diet controlled  . Hyperplastic colonic polyp 2003 & 2008   #3 polyps first, 1 second time  . Hypertension   . IBS (irritable bowel syndrome)   . Migraines   . OSA (obstructive sleep apnea) 07/25/2012   C- pap  . 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 Skin cancer    basal cell and 1 squamous cell on face and neck  . Thyroid disease 2011   hyperthyroid and treated with radio active iodine - now  hypothyroid  . Ulcer ([place]) 1982   gastric    Past Surgical History:  Procedure Laterality Date  . COLONOSCOPY    . MOHS SURGERY  2006   left side of face  . POLYPECTOMY  colon  . THYROID SURGERY  2011   radioactive  . VAGINAL HYSTERECTOMY  1988   DUB and endometriosis    Current Outpatient Prescriptions  Medication Sig Dispense Refill  . aspirin EC 81 MG tablet Take 1 tablet (81 mg total) by mouth daily.    Marland Kitchen atenolol (TENORMIN) 100 MG tablet Take 1 tablet (100 mg total) by mouth daily. 90 tablet 1  . Calcium Carbonate-Vitamin D (CALCIUM 600 + D PO) Take 1 tablet by mouth daily.     . clonazePAM (KLONOPIN) 0.5 MG tablet Take 1 tablet (0.5 mg total) by mouth 2 (two) times daily as needed. 60 tablet 1  . dexlansoprazole (DEXILANT) 60 MG capsule TAKE 1 CAPSULE (60 MG TOTAL) BY MOUTH 2 (TWO) TIMES DAILY. 180 capsule 1  . Dulaglutide (TRULICITY) 1.5 0000000 SOPN Inject 1.5 mg into the skin once a week. 18 mL 0  . escitalopram (LEXAPRO) 20 MG tablet Take 1 tablet (20 mg total) by mouth daily. 90 tablet 1  . fluconazole (DIFLUCAN)  150 MG tablet 1 po q week x 4 weeks 4 tablet 0  . glucose blood (ONETOUCH VERIO) test strip USE TO CHECK BG 1 TO 2 TIMES DAILY 300 each 0  . hydrochlorothiazide (HYDRODIURIL) 25 MG tablet TAKE 1 TABLET (25 MG TOTAL) BY MOUTH DAILY. 90 tablet 1  . insulin degludec (TRESIBA) 100 UNIT/ML SOPN FlexTouch Pen Inject 0.4 mLs (40 Units total) into the skin daily at 10 pm. 15 mL 5  . Insulin Pen Needle (PEN NEEDLES) 31G X 5 MM MISC 1 each by Does not apply route daily. Duffy Bruce tresiba injection  Dx E11.9 100 each 11  . levothyroxine (SYNTHROID, LEVOTHROID) 175 MCG tablet Take 1 tablet (175 mcg total) by mouth daily. Except Sunday.  Takes 200 mg tab on Sunday. 90 tablet 3  . levothyroxine (SYNTHROID, LEVOTHROID) 200 MCG tablet Take one tablet by mouth on Sundays only 90 tablet 0  . linagliptin (TRADJENTA) 5 MG TABS tablet Take 1 tablet (5 mg total) by mouth daily. 30 tablet 5   . Miconazole 50 MG TABS Place 1 tablet (50 mg total) inside cheek daily. 14 tablet 0  . Multiple Vitamins-Minerals (MULTI FOR HER 50+) CAPS Take 1 capsule by mouth daily.    Marland Kitchen nystatin (MYCOSTATIN) 100000 UNIT/ML suspension Take 5 mLs (500,000 Units total) by mouth 4 (four) times daily. 60 mL 0  . ONETOUCH DELICA LANCETS 99991111 MISC Use to check BG 1 to 2 times daily 100 each 3  . pilocarpine (SALAGEN) 5 MG tablet Take 1 tablet (5 mg total) by mouth 2 (two) times daily. 30 tablet 5  . pravastatin (PRAVACHOL) 40 MG tablet TAKE 1 TABLET (40 MG TOTAL) BY MOUTH EVERY EVENING. 90 tablet 1  . zolpidem (AMBIEN) 10 MG tablet Take 1 tablet (10 mg total) by mouth at bedtime as needed. 30 tablet 1   No current facility-administered medications for this visit.     Family History  Problem Relation Age of Onset  . Adopted: Yes  . Lung cancer Mother   . Lung cancer Maternal Aunt   . Stroke Maternal Grandfather   . Diabetes Maternal Grandfather     ROS:  Pertinent items are noted in HPI.  Otherwise, a comprehensive ROS was negative.  Exam:   BP (!) 142/84 (BP Location: Right Arm, Patient Position: Sitting, Cuff Size: Large)   Pulse 64   Ht 5\' 5"  (1.651 m)   Wt 285 lb (129.3 kg)   LMP 05/24/1986 (Approximate)   BMI 47.43 kg/m  Height: 5\' 5"  (165.1 cm) Ht Readings from Last 3 Encounters:  07/13/16 5\' 5"  (1.651 m)  06/28/16 5\' 5"  (1.651 m)  06/15/16 5\' 5"  (1.651 m)    General appearance: alert, cooperative and appears stated age Head: Normocephalic, without obvious abnormality, atraumatic Neck: no adenopathy, supple, symmetrical, trachea midline and thyroid normal to inspection and palpation Lungs: clear to auscultation bilaterally Breasts: normal appearance, no masses or tenderness Heart: regular rate and rhythm Abdomen: soft, non-tender; no masses,  no organomegaly Extremities: extremities normal, atraumatic, no cyanosis or edema Skin: Skin color, texture, turgor normal. No rashes or  lesions Lymph nodes: Cervical, supraclavicular, and axillary nodes normal. No abnormal inguinal nodes palpated Neurologic: Grossly normal   Pelvic: External genitalia:  no lesions              Urethra:  normal appearing urethra with no masses, tenderness or lesions              Bartholin's and Skene's: normal  Vagina: normal appearing vagina with normal color and discharge, no lesions              Cervix: absent              Pap taken: No. Bimanual Exam:  Uterus:  uterus absent              Adnexa: no mass, fullness, tenderness               Rectovaginal: Confirms               Anus:  normal sphincter tone, no lesions  Chaperone present: yes  A:  Well Woman with normal exam    S/P TVH secondary to DUB and ?endometriosis 11/1986 History of right paraovarian cyst vs. solid mass in the past - last PUS 01/2006, MRI abdomen 02/19/06 and consult with Dr. Clarene Essex 03/01/2006 - see paper chart. History of Anxiety and depression on med's History of HTN. DM, Hypothyroid, morbid obesity, sleep apnea, colon polyps Remote history of CIN II 1988, normal since - pap every 3 yrs  Recent Bronchitis and resulting thrush in mouth             R/O UTI with dysuria   P:   Reviewed health and wellness pertinent to exam  Pap smear as above  Mammogram is due 06/2017  Will get BMD with PCP  Will follow with urine C&S - will start her on Septra DS until results  Counseled on breast self exam, mammography screening, adequate intake of calcium and vitamin D, diet and exercise, Kegel's exercises return annually or prn  An After Visit Summary was printed and given to the patient.

## 2016-07-13 ENCOUNTER — Ambulatory Visit (INDEPENDENT_AMBULATORY_CARE_PROVIDER_SITE_OTHER): Payer: BLUE CROSS/BLUE SHIELD | Admitting: Nurse Practitioner

## 2016-07-13 ENCOUNTER — Encounter: Payer: Self-pay | Admitting: Nurse Practitioner

## 2016-07-13 VITALS — BP 142/84 | HR 64 | Ht 65.0 in | Wt 285.0 lb

## 2016-07-13 DIAGNOSIS — R300 Dysuria: Secondary | ICD-10-CM

## 2016-07-13 DIAGNOSIS — Z01411 Encounter for gynecological examination (general) (routine) with abnormal findings: Secondary | ICD-10-CM | POA: Diagnosis not present

## 2016-07-13 DIAGNOSIS — E119 Type 2 diabetes mellitus without complications: Secondary | ICD-10-CM

## 2016-07-13 DIAGNOSIS — R87619 Unspecified abnormal cytological findings in specimens from cervix uteri: Secondary | ICD-10-CM | POA: Insufficient documentation

## 2016-07-13 DIAGNOSIS — I10 Essential (primary) hypertension: Secondary | ICD-10-CM | POA: Diagnosis not present

## 2016-07-13 DIAGNOSIS — E039 Hypothyroidism, unspecified: Secondary | ICD-10-CM

## 2016-07-13 DIAGNOSIS — B370 Candidal stomatitis: Secondary | ICD-10-CM

## 2016-07-13 LAB — POCT URINALYSIS DIPSTICK
BILIRUBIN UA: NEGATIVE
Glucose, UA: 250
KETONES UA: NEGATIVE
Nitrite, UA: NEGATIVE
PH UA: 5
RBC UA: NEGATIVE
Urobilinogen, UA: NEGATIVE

## 2016-07-13 MED ORDER — SULFAMETHOXAZOLE-TRIMETHOPRIM 800-160 MG PO TABS: 1.0000 | ORAL_TABLET | Freq: Two times a day (BID) | ORAL | 0 refills | Status: DC

## 2016-07-13 MED ORDER — FLUCONAZOLE 150 MG PO TABS: ORAL_TABLET | ORAL | 0 refills | Status: DC

## 2016-07-13 NOTE — Patient Instructions (Signed)

## 2016-07-14 LAB — URINE CULTURE

## 2016-07-15 NOTE — Progress Notes (Signed)
Encounter reviewed by Dr. Brook Amundson C. Silva.  

## 2016-08-05 ENCOUNTER — Other Ambulatory Visit: Payer: Self-pay | Admitting: Nurse Practitioner

## 2016-08-05 DIAGNOSIS — F411 Generalized anxiety disorder: Secondary | ICD-10-CM

## 2016-08-05 DIAGNOSIS — F5101 Primary insomnia: Secondary | ICD-10-CM

## 2016-08-05 NOTE — Telephone Encounter (Signed)
Last filled 07/08/16, last seen 05/28/16. Call in

## 2016-08-05 NOTE — Telephone Encounter (Signed)
Please call in ambien and clonazepam with 1each refills

## 2016-08-06 ENCOUNTER — Encounter: Payer: Self-pay | Admitting: *Deleted

## 2016-08-12 ENCOUNTER — Other Ambulatory Visit: Payer: Self-pay | Admitting: Nurse Practitioner

## 2016-08-12 DIAGNOSIS — E119 Type 2 diabetes mellitus without complications: Secondary | ICD-10-CM

## 2016-08-27 ENCOUNTER — Encounter: Payer: Self-pay | Admitting: Nurse Practitioner

## 2016-08-27 ENCOUNTER — Ambulatory Visit (INDEPENDENT_AMBULATORY_CARE_PROVIDER_SITE_OTHER): Payer: BLUE CROSS/BLUE SHIELD | Admitting: Nurse Practitioner

## 2016-08-27 VITALS — BP 137/71 | HR 64 | Temp 98.4°F | Ht 65.0 in | Wt 272.0 lb

## 2016-08-27 DIAGNOSIS — E1165 Type 2 diabetes mellitus with hyperglycemia: Secondary | ICD-10-CM | POA: Diagnosis not present

## 2016-08-27 DIAGNOSIS — E782 Mixed hyperlipidemia: Secondary | ICD-10-CM | POA: Diagnosis not present

## 2016-08-27 DIAGNOSIS — G4700 Insomnia, unspecified: Secondary | ICD-10-CM | POA: Diagnosis not present

## 2016-08-27 DIAGNOSIS — K219 Gastro-esophageal reflux disease without esophagitis: Secondary | ICD-10-CM

## 2016-08-27 DIAGNOSIS — F411 Generalized anxiety disorder: Secondary | ICD-10-CM

## 2016-08-27 DIAGNOSIS — G4733 Obstructive sleep apnea (adult) (pediatric): Secondary | ICD-10-CM

## 2016-08-27 DIAGNOSIS — I10 Essential (primary) hypertension: Secondary | ICD-10-CM | POA: Diagnosis not present

## 2016-08-27 DIAGNOSIS — E032 Hypothyroidism due to medicaments and other exogenous substances: Secondary | ICD-10-CM

## 2016-08-27 DIAGNOSIS — F5101 Primary insomnia: Secondary | ICD-10-CM | POA: Diagnosis not present

## 2016-08-27 DIAGNOSIS — F3342 Major depressive disorder, recurrent, in full remission: Secondary | ICD-10-CM

## 2016-08-27 LAB — BAYER DCA HB A1C WAIVED: HB A1C (BAYER DCA - WAIVED): 8.3 % — ABNORMAL HIGH (ref <7.0)

## 2016-08-27 MED ORDER — LEVOTHYROXINE SODIUM 175 MCG PO TABS: 175.0000 ug | ORAL_TABLET | Freq: Every day | ORAL | 1 refills | Status: DC

## 2016-08-27 MED ORDER — LEVOTHYROXINE SODIUM 200 MCG PO TABS: ORAL_TABLET | ORAL | 1 refills | Status: DC

## 2016-08-27 MED ORDER — DEXLANSOPRAZOLE 60 MG PO CPDR: DELAYED_RELEASE_CAPSULE | ORAL | 1 refills | Status: DC

## 2016-08-27 MED ORDER — INSULIN DEGLUDEC 100 UNIT/ML ~~LOC~~ SOPN: 40.0000 [IU] | PEN_INJECTOR | Freq: Every day | SUBCUTANEOUS | 1 refills | Status: DC

## 2016-08-27 MED ORDER — CLONAZEPAM 0.5 MG PO TABS: 0.5000 mg | ORAL_TABLET | Freq: Two times a day (BID) | ORAL | 2 refills | Status: DC | PRN

## 2016-08-27 MED ORDER — ESCITALOPRAM OXALATE 20 MG PO TABS: 20.0000 mg | ORAL_TABLET | Freq: Every day | ORAL | 1 refills | Status: DC

## 2016-08-27 MED ORDER — ZOLPIDEM TARTRATE 10 MG PO TABS
10.0000 mg | ORAL_TABLET | Freq: Every evening | ORAL | 2 refills | Status: DC | PRN
Start: 2016-08-27 — End: 2016-11-30

## 2016-08-27 MED ORDER — HYDROCHLOROTHIAZIDE 25 MG PO TABS: ORAL_TABLET | ORAL | 1 refills | Status: DC

## 2016-08-27 MED ORDER — DULAGLUTIDE 1.5 MG/0.5ML ~~LOC~~ SOAJ: 1.5000 mg | SUBCUTANEOUS | 1 refills | Status: DC

## 2016-08-27 MED ORDER — ATENOLOL 100 MG PO TABS: 100.0000 mg | ORAL_TABLET | Freq: Every day | ORAL | 1 refills | Status: DC

## 2016-08-27 MED ORDER — LINAGLIPTIN 5 MG PO TABS
5.0000 mg | ORAL_TABLET | Freq: Every day | ORAL | 1 refills | Status: DC
Start: 2016-08-27 — End: 2016-08-30

## 2016-08-27 MED ORDER — PRAVASTATIN SODIUM 40 MG PO TABS: ORAL_TABLET | ORAL | 1 refills | Status: DC

## 2016-08-27 NOTE — Progress Notes (Signed)
Subjective:    Patient ID: [name], female    DOB: 1956/05/01, 61 y.o.   MRN: [id number]  HPI  [name] is here today for follow up of chronic medical problem.  Outpatient Encounter Prescriptions as of 08/27/2016  Medication Sig  . aspirin EC 81 MG tablet Take 1 tablet (81 mg total) by mouth daily.  Marland Kitchen atenolol (TENORMIN) 100 MG tablet Take 1 tablet (100 mg total) by mouth daily.  . Calcium Carbonate-Vitamin D (CALCIUM 600 + D PO) Take 1 tablet by mouth daily.   . clonazePAM (KLONOPIN) 0.5 MG tablet TAKE 1 TABLET TWICE A DAY AS NEEDED  . dexlansoprazole (DEXILANT) 60 MG capsule TAKE 1 CAPSULE (60 MG TOTAL) BY MOUTH 2 (TWO) TIMES DAILY.  . Dulaglutide (TRULICITY) 1.5 QQ/7.6PP SOPN Inject 1.5 mg into the skin once a week.  . escitalopram (LEXAPRO) 20 MG tablet Take 1 tablet (20 mg total) by mouth daily.  . fluconazole (DIFLUCAN) 150 MG tablet 1 po q week x 4 weeks  . glucose blood (ONETOUCH VERIO) test strip USE TO CHECK BG 1 TO 2 TIMES DAILY  . hydrochlorothiazide (HYDRODIURIL) 25 MG tablet TAKE 1 TABLET (25 MG TOTAL) BY MOUTH DAILY.  Marland Kitchen insulin degludec (TRESIBA) 100 UNIT/ML SOPN FlexTouch Pen Inject 0.4 mLs (40 Units total) into the skin daily at 10 pm.  . Insulin Pen Needle (PEN NEEDLES) 31G X 5 MM MISC 1 each by Does not apply route daily. Duffy Bruce tresiba injection  Dx E11.9  . levothyroxine (SYNTHROID, LEVOTHROID) 175 MCG tablet Take 1 tablet (175 mcg total) by mouth daily. Except Sunday.  Takes 200 mg tab on Sunday.  . levothyroxine (SYNTHROID, LEVOTHROID) 200 MCG tablet Take one tablet by mouth on Sundays only  . linagliptin (TRADJENTA) 5 MG TABS tablet Take 1 tablet (5 mg total) by mouth daily.  . Miconazole 50 MG TABS Place 1 tablet (50 mg total) inside cheek daily.  . Multiple Vitamins-Minerals (MULTI FOR HER 50+) CAPS Take 1 capsule by mouth daily.  Marland Kitchen nystatin (MYCOSTATIN) 100000 UNIT/ML suspension Take 5 mLs (500,000 Units total) by mouth 4 (four) times daily.  [name] DELICA LANCETS 50D MISC Use to check BG 1 to 2 times daily  . pilocarpine (SALAGEN) 5 MG tablet Take 1 tablet (5 mg total) by mouth 2 (two) times daily.  . pravastatin (PRAVACHOL) 40 MG tablet TAKE 1 TABLET (40 MG TOTAL) BY MOUTH EVERY EVENING.  Marland Kitchen sulfamethoxazole-trimethoprim (BACTRIM DS,SEPTRA DS) 800-160 MG tablet Take 1 tablet by mouth 2 (two) times daily.  . TRULICITY 1.5 TO/6.7TI SOPN INJECT 1.5 MG INTO THE SKIN ONCE A WEEK.  Marland Kitchen zolpidem (AMBIEN) 10 MG tablet TAKE 1 TABLET AT BEDTIME AS NEEDED   No facility-administered encounter medications on file as of 08/27/2016.     1. Essential hypertension  NO c/o HA, chest pai or SOB- does not check blood pressure at home  2. OSA (obstructive sleep apnea)  Sleeps with CPAP nightly- feels rested in morning  3. Gastroesophageal reflux disease without esophagitis  currently on dexilant- keeps symptoms under control  4. Type 2 diabetes mellitus with hyperglycemia, without long-term current use of insulin (HCC)  Fasting blood sugars averaging 130-140. Does not watch diet very closely- last HGBA1c was 8.2%- tresiba was increased to 40u at last visit  5. Hypothyroidism, unspecified type  No problems  6. Recurrent major depressive disorder, in full remission [facility])  Patient doing much better- she was having problems with one of her daughters and  tathas gotten better which has really helped her mood.  7. GAD (generalized anxiety disorder)  Again stress little has really improved since her daughter is doing better  8. Hyperlipidemia, unspecified hyperlipidemia type  Does not watch diet  9. Insomnia, unspecified type  Bedtime routine  10. Severe obesity (BMI >= 40) (HCC)  No recent weight changes    New complaints: No complaints today     Review of Systems  Constitutional: Negative for diaphoresis.  Eyes: Negative for pain.  Respiratory: Negative for shortness of breath.   Cardiovascular: Negative for chest pain, palpitations and leg  swelling.  Gastrointestinal: Negative for abdominal pain.  Endocrine: Negative for polydipsia.  Skin: Negative for rash.  Neurological: Negative for dizziness, weakness and headaches.  Hematological: Does not bruise/bleed easily.  All other systems reviewed and are negative.      Objective:   Physical Exam  Constitutional: She is oriented to person, place, and time. She appears well-developed and well-nourished.  HENT:  Nose: Nose normal.  Mouth/Throat: Oropharynx is clear and moist.  Eyes: EOM are normal.  Neck: Trachea normal, normal range of motion and full passive range of motion without pain. Neck supple. No JVD present. Carotid bruit is not present. No thyromegaly present.  Cardiovascular: Normal rate, regular rhythm, normal heart sounds and intact distal pulses.  Exam reveals no gallop and no friction rub.   No murmur heard. Pulmonary/Chest: Effort normal and breath sounds normal.  Abdominal: Soft. Bowel sounds are normal. She exhibits no distension and no mass. There is no tenderness.  Musculoskeletal: Normal range of motion.  Lymphadenopathy:    She has no cervical adenopathy.  Neurological: She is alert and oriented to person, place, and time. She has normal reflexes.  Skin: Skin is warm and dry.  Psychiatric: She has a normal mood and affect. Her behavior is normal. Judgment and thought content normal.   BP 137/71   Pulse 64   Temp 98.4 F (36.9 C) (Oral)   Ht 5' 5" (1.651 m)   Wt 272 lb (123.4 kg)   LMP 05/24/1986 (Approximate)   BMI 45.26 kg/m   HGBa1c 8.3% up from 8.2%   Assessment & Plan:  1. Essential hypertension Low sodium diet - CMP14+EGFR - atenolol (TENORMIN) 100 MG tablet; Take 1 tablet (100 mg total) by mouth daily.  Dispense: 90 tablet; Refill: 1 - hydrochlorothiazide (HYDRODIURIL) 25 MG tablet; TAKE 1 TABLET (25 MG TOTAL) BY MOUTH DAILY.  Dispense: 90 tablet; Refill: 1  2. OSA (obstructive sleep apnea) Wear CPAP nightly  3. Gastroesophageal  reflux disease without esophagitis Avoid spicy foods Do not eat 2 hours prior to bedtime - dexlansoprazole (DEXILANT) 60 MG capsule; TAKE 1 CAPSULE (60 MG TOTAL) BY MOUTH 2 (TWO) TIMES DAILY.  Dispense: 180 capsule; Refill: 1  4. Type 2 diabetes mellitus with hyperglycemia, without long-term current use of insulin (HCC) Stricter carb counting Appointment with clinical pharmacist - [facility]ayer DCA Hb A1c Waived - linagliptin (TRADJENTA) 5 MG TABS tablet; Take 1 tablet (5 mg total) by mouth daily.  Dispense: 90 tablet; Refill: 1 - insulin degludec (TRESIBA) 100 UNIT/ML SOPN FlexTouch Pen; Inject 0.4 mLs (40 Units total) into the skin daily at 10 pm.  Dispense: 15 pen; Refill: 1 - Dulaglutide (TRULICITY) 1.5 NT/7.0YF SOPN; Inject 1.5 mg into the skin once a week.  Dispense: 16 pen; Refill: 1  5. Recurrent major depressive disorder, in full remission ([place]) Stress management - escitalopram (LEXAPRO) 20 MG tablet; Take 1 tablet (20 mg  total) by mouth daily.  Dispense: 90 tablet; Refill: 1  6. GAD (generalized anxiety disorder) - clonazePAM (KLONOPIN) 0.5 MG tablet; Take 1 tablet (0.5 mg total) by mouth 2 (two) times daily as needed.  Dispense: 60 tablet; Refill: 2  7. Insomnia, unspecified type Bedtime routine  8. Severe obesity (BMI >= 40) (HCC) Discussed diet and exercise for person with BMI >25 Will recheck weight in 3-6 months  9. Hypothyroidism due to medication - levothyroxine (SYNTHROID, LEVOTHROID) 175 MCG tablet; Take 1 tablet (175 mcg total) by mouth daily. Except Sunday.  Takes 200 mg tab on Sunday.  Dispense: 90 tablet; Refill: 1 - levothyroxine (SYNTHROID, LEVOTHROID) 200 MCG tablet; Take one tablet by mouth on Sundays only  Dispense: 90 tablet; Refill: 1  10. Mixed hyperlipidemia Low fat diet - Lipid panel - pravastatin (PRAVACHOL) 40 MG tablet; TAKE 1 TABLET (40 MG TOTAL) BY MOUTH EVERY EVENING.  Dispense: 90 tablet; Refill: 1  11. Primary insomnia Bedtime routine - zolpidem  (AMBIEN) 10 MG tablet; Take 1 tablet (10 mg total) by mouth at bedtime as needed.  Dispense: 30 tablet; Refill: 2    Labs pending Health maintenance reviewed Diet and exercise encouraged Continue all meds Follow up  In 3 month   [place], FNP

## 2016-08-27 NOTE — Patient Instructions (Signed)
Carbohydrate Counting for Diabetes Mellitus, Adult Carbohydrate counting is a method for keeping track of how many carbohydrates you eat. Eating carbohydrates naturally increases the amount of sugar (glucose) in the blood. Counting how many carbohydrates you eat helps keep your blood glucose within normal limits, which helps you manage your diabetes (diabetes mellitus). It is important to know how many carbohydrates you can safely have in each meal. This is different for every person. A diet and nutrition specialist (registered dietitian) can help you make a meal plan and calculate how many carbohydrates you should have at each meal and snack. Carbohydrates are found in the following foods:  Grains, such as breads and cereals.  Dried beans and soy products.  Starchy vegetables, such as potatoes, peas, and corn.  Fruit and fruit juices.  Milk and yogurt.  Sweets and snack foods, such as cake, cookies, candy, chips, and soft drinks. How do I count carbohydrates? There are two ways to count carbohydrates in food. You can use either of the methods or a combination of both. Reading "Nutrition Facts" on packaged food  The "Nutrition Facts" list is included on the labels of almost all packaged foods and beverages in the [place] It includes:  The serving size.  Information about nutrients in each serving, including the grams (g) of carbohydrate per serving. To use the "Nutrition Facts":  Decide how many servings you will have.  Multiply the number of servings by the number of carbohydrates per serving.  The resulting number is the total amount of carbohydrates that you will be having. Learning standard serving sizes of other foods  When you eat foods containing carbohydrates that are not packaged or do not include "Nutrition Facts" on the label, you need to measure the servings in order to count the amount of carbohydrates:  Measure the foods that you will eat with a food scale or measuring  cup, if needed.  Decide how many standard-size servings you will eat.  Multiply the number of servings by 15. Most carbohydrate-rich foods have about 15 g of carbohydrates per serving.  For example, if you eat 8 oz (170 g) of strawberries, you will have eaten 2 servings and 30 g of carbohydrates (2 servings x 15 g = 30 g).  For foods that have more than one food mixed, such as soups and casseroles, you must count the carbohydrates in each food that is included. The following list contains standard serving sizes of common carbohydrate-rich foods. Each of these servings has about 15 g of carbohydrates:   hamburger bun or  English muffin.   oz (15 mL) syrup.   oz (14 g) jelly.  1 slice of bread.  1 six-inch tortilla.  3 oz (85 g) cooked rice or pasta.  4 oz (113 g) cooked dried beans.  4 oz (113 g) starchy vegetable, such as peas, corn, or potatoes.  4 oz (113 g) hot cereal.  4 oz (113 g) mashed potatoes or  of a large baked potato.  4 oz (113 g) canned or frozen fruit.  4 oz (120 mL) fruit juice.  4-6 crackers.  6 chicken nuggets.  6 oz (170 g) unsweetened dry cereal.  6 oz (170 g) plain fat-free yogurt or yogurt sweetened with artificial sweeteners.  8 oz (240 mL) milk.  8 oz (170 g) fresh fruit or one small piece of fruit.  24 oz (680 g) popped popcorn. Example of carbohydrate counting Sample meal  3 oz (85 g) chicken breast.  6 oz (  170 g) brown rice.  4 oz (113 g) corn.  8 oz (240 mL) milk.  8 oz (170 g) strawberries with sugar-free whipped topping. Carbohydrate calculation 1. Identify the foods that contain carbohydrates:  Rice.  Corn.  Milk.  Strawberries. 2. Calculate how many servings you have of each food:  2 servings rice.  1 serving corn.  1 serving milk.  1 serving strawberries. 3. Multiply each number of servings by 15 g:  2 servings rice x 15 g = 30 g.  1 serving corn x 15 g = 15 g.  1 serving milk x 15 g = 15  g.  1 serving strawberries x 15 g = 15 g. 4. Add together all of the amounts to find the total grams of carbohydrates eaten:  30 g + 15 g + 15 g + 15 g = 75 g of carbohydrates total. This information is not intended to replace advice given to you by your health care provider. Make sure you discuss any questions you have with your health care provider. Document Released: 05/10/2005 Document Revised: 11/28/2015 Document Reviewed: 10/22/2015 Elsevier Interactive Patient Education  2017 [employer]  

## 2016-08-28 LAB — CMP14+EGFR
ALK PHOS: 92 IU/L (ref 39–117)
ALT: 14 IU/L (ref 0–32)
AST: 14 IU/L (ref 0–40)
Albumin/Globulin Ratio: 2.1 (ref 1.2–2.2)
Albumin: 4 g/dL (ref 3.6–4.8)
BUN / CREAT RATIO: 21 (ref 12–28)
BUN: 16 mg/dL (ref 8–27)
Bilirubin Total: 0.4 mg/dL (ref 0.0–1.2)
CO2: 26 mmol/L (ref 18–29)
CREATININE: 0.76 mg/dL (ref 0.57–1.00)
Calcium: 9.3 mg/dL (ref 8.7–10.3)
Chloride: 95 mmol/L — ABNORMAL LOW (ref 96–106)
GFR calc non Af Amer: 85 mL/min/{1.73_m2} (ref >59)
GFR, EST AFRICAN AMERICAN: 98 mL/min/{1.73_m2} (ref >59)
GLUCOSE: 205 mg/dL — AB (ref 65–99)
Globulin, Total: 1.9 g/dL (ref 1.5–4.5)
Potassium: 4 mmol/L (ref 3.5–5.2)
Sodium: 140 mmol/L (ref 134–144)
TOTAL PROTEIN: 5.9 g/dL — AB (ref 6.0–8.5)

## 2016-08-28 LAB — LIPID PANEL
CHOLESTEROL TOTAL: 194 mg/dL (ref 100–199)
Chol/HDL Ratio: 6.3 ratio — ABNORMAL HIGH (ref 0.0–4.4)
HDL: 31 mg/dL — AB (ref >39)
LDL CALC: 97 mg/dL (ref 0–99)
Triglycerides: 328 mg/dL — ABNORMAL HIGH (ref 0–149)
VLDL CHOLESTEROL CAL: 66 mg/dL — AB (ref 5–40)

## 2016-08-30 ENCOUNTER — Ambulatory Visit (INDEPENDENT_AMBULATORY_CARE_PROVIDER_SITE_OTHER): Payer: BLUE CROSS/BLUE SHIELD | Admitting: Pharmacist

## 2016-08-30 DIAGNOSIS — E1165 Type 2 diabetes mellitus with hyperglycemia: Secondary | ICD-10-CM

## 2016-08-30 MED ORDER — METFORMIN HCL ER 500 MG PO TB24: 500.0000 mg | ORAL_TABLET | Freq: Every day | ORAL | 1 refills | Status: DC

## 2016-08-30 NOTE — Patient Instructions (Signed)
Try to work in exercise - goal is 150 minutes per week.   We will retry metformin XR 500mg  once a day  Hold Tradjenta    Goal Blood glucose:    Fasting (before meals) = 80 to 130   Within 2 hours of eating = less than 180  Try to have no more than 3 of these foods per meal:  Fruit:   1/2 cup or once piece (baseball size)- apples, pears, pineapple, peaches, oranges  1 cup - berries, melons  1/2 banana or grapefruit  Stachy Vegetables:   1/2 cup potatoes (white or sweet), corn, peas  Other starches:   1 piece of bread  1/2 cup rice or pasta  4 inch pancake    These foods you can eat more freely: Proteins:   Fish  Chicken or [place]  Beef or pork (1 or 2 servings per week)  Eggs  Nuts (peanuts, walnuts, almonds, pistachios)  Cheese  Non starchy vegetables:  Green beans  Broccoli or cauliflower  Lettuce, greens, cabbage  Brussel Sprout  Carrots  Onions and peppers  Celery  Tomatoes  Asparagus  Eggplant  Cucumbers  Squash and Zucchini

## 2016-08-30 NOTE — Progress Notes (Signed)
Patient ID: [name], female   DOB: 04-26-1956, 61 y.o.   MRN: [id number]   Subjective:    [name] is a 60 y.o. female who presents for an evaluation of Type 2 diabetes mellitus. She is referred by her PCP for uncontrolled T2DM.   I last saw patient 09/2015.  At that time Trulicity was increased to 1.5mg  q week was started and Tradjenta was discontinued.  Currently she is still taking Trulicity 1.5mg  qweek, has restarted Tradgenta 5mg  qd and is taking Tresiba 40 units qd at 9pm.  She tried metformin about 2 years ago at her endocrinologist's office (sees [name] for thyroid disorder) and was unable to tolerate - had severe diarrhea. From past medication records it apprears she was taking metformin XR 500mg  2 tablets bid.   Known diabetic complications: none Cardiovascular risk factors: diabetes mellitus, dyslipidemia, hypertension and obesity (BMI >= 30 kg/m2)  Eye exam current (within one year): yes Weight trend: increased from 265# to 272# over the last year Prior visit with CDE:  yes Current diet: in general, a "healthy" diet   trying to limit to CHO.  No sweets. Limiting potatoes and bread. Drinks unsweet tea or water Current exercise: no currently  Current monitoring regimen: home blood tests - once daily over the last week however glucometer record shows she has not been checking BG on a regular basis prior to appt with PCP last week 7 day avg = 216 (n= 6) 90 day avg = 211 (n=8) Any episodes of hypoglycemia? No  Is She on ACE inhibitor or angiotensin II receptor blocker?  No   The following portions of the patient's history were reviewed and updated as appropriate: allergies, current medications, past family history, past medical history and problem list.   Objective:    LMP 05/24/1986 (Approximate)    A1c = 8.2% (08/30/2016)   Lab Review Glucose (mg/dL)  Date Value  08/27/2016 205 (H)  05/28/2016 172 (H)  02/27/2016 196 (H)   Glucose, Bld (mg/dL)   Date Value  06/15/2016 176 (H)  05/13/2015 147 (H)  01/30/2015 199 (H)   CO2  Date Value  08/27/2016 26 mmol/L  06/15/2016 34 mEq/L (H)  05/28/2016 24 mmol/L   BUN (mg/dL)  Date Value  08/27/2016 16  06/15/2016 15  05/28/2016 16  02/27/2016 14   Creat (mg/dL)  Date Value  05/13/2015 0.70  01/30/2015 0.75  10/24/2014 0.74   Creatinine, Ser (mg/dL)  Date Value  08/27/2016 0.76  06/15/2016 0.86  05/28/2016 0.75    Assessment:    Diabetes Mellitus type II, under inadequate control.   Obesity   Plan:    1.  Rx changes:   D/c tradjenta 5mg  qd (similar MOA of Trulicity)  Retry metformin XR 500mg  take 1 tablet qd with food  Continue Trulicity 1.5mg  SQ weekly and Tresiba 40unit qd  Reviewed patient's formulary.  If future if further BG lowering needed may consider SGLT2 - Wilder Glade and Invokana on her insurance formulary for 2018. 2.  Education: Reviewed 'ABCs' of diabetes management (respective goals in parentheses):  A1C (<7), blood pressure (<130/80), and cholesterol (LDL <100). 3.  Continue with current diet changes / limiting CHO's.   4.  Recommend restart daily walking. 5.  Check BG more frequently - recommend 1 to 2 times daily and varying times of day 5. Follow up: 4 weeks     Cherre Robins, PharmD, CPP. CDE

## 2016-09-14 LAB — HM DIABETES EYE EXAM

## 2016-09-16 ENCOUNTER — Telehealth: Payer: Self-pay | Admitting: Nurse Practitioner

## 2016-09-16 NOTE — Telephone Encounter (Signed)
Duplicate phone message please see other message from today 09/16/16

## 2016-09-16 NOTE — Telephone Encounter (Signed)
Patient called back.  She stopped metformin Tuesday, April 24th.  She restart Tradjenta and this am BG was 143.  Although I don't think she will get much additional lowering of BG from Tradjenta she can continue until our next visit in 2 weeks and if BG is not less than 150 in am then can try SGLT2.

## 2016-09-16 NOTE — Telephone Encounter (Signed)
Tried to call patient. No available but left message with husband to have her call office.  I recommend she stop metformin.  If BG is still over 150 in am then recommends start either Farxiga 5mg  or Invokana 100mg  daily.  Can give samples.

## 2016-09-29 ENCOUNTER — Encounter: Payer: Self-pay | Admitting: Pharmacist

## 2016-09-29 ENCOUNTER — Ambulatory Visit (INDEPENDENT_AMBULATORY_CARE_PROVIDER_SITE_OTHER): Payer: BLUE CROSS/BLUE SHIELD | Admitting: Pharmacist

## 2016-09-29 VITALS — BP 120/70 | HR 68 | Ht 65.0 in | Wt 267.0 lb

## 2016-09-29 DIAGNOSIS — E1165 Type 2 diabetes mellitus with hyperglycemia: Secondary | ICD-10-CM

## 2016-09-29 DIAGNOSIS — Z794 Long term (current) use of insulin: Secondary | ICD-10-CM

## 2016-09-29 MED ORDER — LINAGLIPTIN 5 MG PO TABS: 5.0000 mg | ORAL_TABLET | Freq: Every day | ORAL | 1 refills | Status: DC

## 2016-09-29 MED ORDER — INSULIN DEGLUDEC 200 UNIT/ML ~~LOC~~ SOPN: 40.0000 [IU] | PEN_INJECTOR | Freq: Every day | SUBCUTANEOUS | 1 refills | Status: DC

## 2016-09-29 NOTE — Progress Notes (Signed)
Patient ID: [name], female   DOB: [birth year], 61 y.o.   MRN: [id number]    Subjective:    [name] is a 61 y.o. female who presents for reevaluation of Type 2 diabetes mellitus. She is referred by her PCP. I last saw patient 08/2016.   At our last visit she was to retry metformin in the ER for and stop Tradjenta since she was also taking Trulicity.  After 2 days of taking metformin ER 500mg  qd she had severe diarrhea and stopped metformin.  She then restarted Tradgenta.   She is also taking Trulicity 1.5mg  qweek and Tresiba 40 units qd at 9pm.  [name] also reports that 2 weeks ago she discovered that she was injecting Trulicity incorrectly.  She was placing syringe up side down and medication was going onto carpet instead of injecting into stomach.  Her last 2 doses have been administered correctly and she notes an improvement in HBG and decreased in appetite.   Known diabetic complications: none Cardiovascular risk factors: diabetes mellitus, dyslipidemia, hypertension and obesity (BMI >= 30 kg/m2)  Eye exam current (within one year): yes - done 08/2016 at my eye dr - will request copy of notes Weight trend: decreased by 5# over the last month Prior visit with CDE:  yes Current diet: in general, a "healthy" diet   trying to limit to CHO.  Low cottage cheese.  Watching fruit serving sizes - 1/2 cup.  Increased in protein - eggs, grilled chicken. Increase in salads with low calorie dressing.   Filling full sooner and able to not eat as large of portions. Current exercise: water exercise / Yoga  Current monitoring regimen: home blood tests - once daily over the last week however glucometer record shows she has not been checking BG on a regular basis prior to appt with PCP last week 7 day avg = 181 (down from 216) 14 day avg = 176 30 day avg = 184 90 day avg = 187 (down from 211)  Any episodes of hypoglycemia? No  Is She on ACE inhibitor or angiotensin II receptor  blocker?  No   The following portions of the patient's history were reviewed and updated as appropriate: allergies, current medications, past family history, past medical history and problem list.   Objective:    BP 120/70   Pulse 68   Ht 5\' 5"  (1.651 m)   Wt 267 lb (121.1 kg)   LMP 05/24/1986 (Approximate)   BMI 44.43 kg/m    A1c = 8.2% (08/30/2016)   Lab Review Glucose (mg/dL)  Date Value  08/27/2016 205 (H)  05/28/2016 172 (H)  02/27/2016 196 (H)   Glucose, Bld (mg/dL)  Date Value  06/15/2016 176 (H)  05/13/2015 147 (H)  01/30/2015 199 (H)   Creatinine, Ser (mg/dL)  Date Value  08/27/2016 0.76  06/15/2016 0.86  05/28/2016 0.75    Assessment:    Diabetes Mellitus type 2, under improving control.   Obesity - weight has decreased   Plan:    1.  Rx changes:   Continue tradjenta 5mg  qd  Continue Trulicity 1.5mg  SQ weekly    If future if further BG lowering needed may consider SGLT2 - Wilder Glade and Invokana on her insurance formulary for 2018. 2.  Education: Reviewed 'ABCs' of diabetes management (respective goals in parentheses):  A1C (<7), blood pressure (<130/80), and cholesterol (LDL <100). 3.  Continue with current diet changes / limiting CHO's.   4.  Continue exercise / swimming 5.  Continue to check  BG 1 to 2 times daily and at varying times of day 6. Follow up: 4 weeks  to recheck A1c - follow up PCP as planned 11/2016   Cherre Robins, PharmD, CPP. CDE

## 2016-10-09 ENCOUNTER — Ambulatory Visit (INDEPENDENT_AMBULATORY_CARE_PROVIDER_SITE_OTHER): Payer: BLUE CROSS/BLUE SHIELD | Admitting: Family Medicine

## 2016-10-09 VITALS — BP 135/73 | HR 85 | Temp 97.4°F | Ht 65.0 in | Wt 259.0 lb

## 2016-10-09 DIAGNOSIS — E1165 Type 2 diabetes mellitus with hyperglycemia: Secondary | ICD-10-CM

## 2016-10-09 DIAGNOSIS — Z794 Long term (current) use of insulin: Secondary | ICD-10-CM | POA: Diagnosis not present

## 2016-10-09 DIAGNOSIS — R1030 Lower abdominal pain, unspecified: Secondary | ICD-10-CM | POA: Diagnosis not present

## 2016-10-09 DIAGNOSIS — B3731 Acute candidiasis of vulva and vagina: Secondary | ICD-10-CM

## 2016-10-09 DIAGNOSIS — R300 Dysuria: Secondary | ICD-10-CM | POA: Diagnosis not present

## 2016-10-09 DIAGNOSIS — N3090 Cystitis, unspecified without hematuria: Secondary | ICD-10-CM | POA: Diagnosis not present

## 2016-10-09 DIAGNOSIS — B373 Candidiasis of vulva and vagina: Secondary | ICD-10-CM

## 2016-10-09 MED ORDER — NITROFURANTOIN MONOHYD MACRO 100 MG PO CAPS: 100.0000 mg | ORAL_CAPSULE | Freq: Two times a day (BID) | ORAL | 0 refills | Status: DC

## 2016-10-09 MED ORDER — CEFTRIAXONE SODIUM 1 G IJ SOLR
0.5000 g | Freq: Once | INTRAMUSCULAR | Status: AC
  Administered 2016-10-09: 0.5 g via INTRAMUSCULAR

## 2016-10-09 MED ORDER — FLUCONAZOLE 150 MG PO TABS: 150.0000 mg | ORAL_TABLET | Freq: Once | ORAL | 0 refills | Status: AC

## 2016-10-09 MED ORDER — PHENAZOPYRIDINE HCL 200 MG PO TABS: 200.0000 mg | ORAL_TABLET | Freq: Four times a day (QID) | ORAL | 0 refills | Status: DC | PRN

## 2016-10-09 NOTE — Addendum Note (Signed)
Addended by: Rolena Infante on: 10/09/2016 11:02 AM   Modules accepted: Orders

## 2016-10-09 NOTE — Progress Notes (Signed)
Subjective:  Patient ID: [name], female    DOB: [birth year]  Age: 61 y.o. MRN: [id number]  CC: Dysuria (Patient has been taking antibiotics from home)   HPI [name] presents for burning with urination and frequency for 3 days. Denies fever . No flank pain. No nausea, vomiting. The burning is severe with urination and feels like she is trying to pass pins and needles. She had some leftover sulfa that she took for the first day or 2 for relief but it has come back with a vengeance today. Additionally she took some Azo at first but wanted to discontinue that in light of her decision to come here. Last dose was yesterday. She is having fairly intense suprapubic discomfort. No vaginal discharge. However, she is very prone to yeast infections when taking antibiotics. She requests the little pink pill that you take for yeast.Patient is a diabetic. Her sugars have not been affected abnormally by this infection. However this does seem to make her more prone to infections including the yeast. Blood sugars running between 101 150 through the day.  History [name] has a past medical history of Abnormal Pap smear of cervix (1988); Anxiety; Arthritis (2015); Basal cell carcinoma of face (2008); Depression; Diabetes mellitus without complication ([place]); Exposure to TB; GERD (gastroesophageal reflux disease); Hiatal hernia; Hyperlipidemia; Hyperplastic colonic polyp (2003 & 2008); Hypertension; IBS (irritable bowel syndrome); Migraines; OSA (obstructive sleep apnea) (07/25/2012); PPD positive, treated (1995); Skin cancer; Thyroid disease (2011); and Ulcer (1982).   She has a past surgical history that includes Mohs surgery (2006); Polypectomy (colon); Colonoscopy; Thyroid surgery (2011); and Vaginal hysterectomy (1988).   Her family history includes Diabetes in her maternal grandfather; Lung cancer in her maternal aunt and mother; Stroke in her maternal grandfather. She was adopted.She reports  that she has never smoked. She has never used smokeless tobacco. She reports that she does not drink alcohol or use drugs.  Current Outpatient Prescriptions on File Prior to Visit  Medication Sig Dispense Refill  . aspirin EC 81 MG tablet Take 1 tablet (81 mg total) by mouth daily.    Marland Kitchen atenolol (TENORMIN) 100 MG tablet Take 1 tablet (100 mg total) by mouth daily. 90 tablet 1  . Calcium Carbonate-Vitamin D (CALCIUM 600 + D PO) Take 1 tablet by mouth daily.     . clonazePAM (KLONOPIN) 0.5 MG tablet Take 1 tablet (0.5 mg total) by mouth 2 (two) times daily as needed. 60 tablet 2  . dexlansoprazole (DEXILANT) 60 MG capsule TAKE 1 CAPSULE (60 MG TOTAL) BY MOUTH 2 (TWO) TIMES DAILY. 180 capsule 1  . Dulaglutide (TRULICITY) 1.5 VC/9.4WH SOPN Inject 1.5 mg into the skin once a week. 16 pen 1  . escitalopram (LEXAPRO) 20 MG tablet Take 1 tablet (20 mg total) by mouth daily. (Patient taking differently: Take 5 mg by mouth every other day. ) 90 tablet 1  . glucose blood (ONETOUCH VERIO) test strip USE TO CHECK BG 1 TO 2 TIMES DAILY 300 each 0  . hydrochlorothiazide (HYDRODIURIL) 25 MG tablet TAKE 1 TABLET (25 MG TOTAL) BY MOUTH DAILY. 90 tablet 1  . Insulin Degludec (TRESIBA FLEXTOUCH) 200 UNIT/ML SOPN Inject 40 Units into the skin daily. 18 mL 1  . Insulin Pen Needle (PEN NEEDLES) 31G X 5 MM MISC 1 each by Does not apply route daily. [name] tresiba injection  Dx E11.9 100 each 11  . levothyroxine (SYNTHROID, LEVOTHROID) 175 MCG tablet Take 1 tablet (175 mcg total) by mouth daily.  Except Sunday.  Takes 200 mg tab on Sunday. 90 tablet 1  . levothyroxine (SYNTHROID, LEVOTHROID) 200 MCG tablet Take one tablet by mouth on Sundays only 90 tablet 1  . linagliptin (TRADJENTA) 5 MG TABS tablet Take 1 tablet (5 mg total) by mouth daily. 90 tablet 1  . Multiple Vitamins-Minerals (MULTI FOR HER 50+) CAPS Take 1 capsule by mouth daily.    [name] DELICA LANCETS 67M MISC Use to check BG 1 to 2 times daily 100 each 3    . pravastatin (PRAVACHOL) 40 MG tablet TAKE 1 TABLET (40 MG TOTAL) BY MOUTH EVERY EVENING. 90 tablet 1  . zolpidem (AMBIEN) 10 MG tablet Take 1 tablet (10 mg total) by mouth at bedtime as needed. 30 tablet 2   No current facility-administered medications on file prior to visit.     ROS Review of Systems  Objective:  BP 135/73   Pulse 85   Temp 97.4 F (36.3 C) (Oral)   Ht 5\' 5"  (1.651 m)   Wt 259 lb (117.5 kg)   LMP 05/24/1986 (Approximate)   BMI 43.10 kg/m   Physical Exam  Constitutional: She is oriented to person, place, and time. She appears well-developed and well-nourished. She appears distressed.  HENT:  Head: Normocephalic and atraumatic.  Cardiovascular: Normal rate and regular rhythm.   No murmur heard. Pulmonary/Chest: Effort normal and breath sounds normal. No respiratory distress.  Abdominal: Soft. Bowel sounds are normal. She exhibits no mass. There is tenderness (suprapubic). There is no rebound and no guarding.  Musculoskeletal: Normal range of motion. She exhibits no tenderness.  Neurological: She is alert and oriented to person, place, and time.  Skin: Skin is warm and dry.  Psychiatric: Her behavior is normal. Her mood appears anxious.    Assessment & Plan:   [name] was seen today for dysuria.  Diagnoses and all orders for this visit:  Lower abdominal pain -     cefTRIAXone (ROCEPHIN) injection 0.5 g; Inject 0.5 g into the muscle once.  Dysuria -     Cancel: Urinalysis, Complete -     Urinalysis -     cefTRIAXone (ROCEPHIN) injection 0.5 g; Inject 0.5 g into the muscle once.  Cystitis -     cefTRIAXone (ROCEPHIN) injection 0.5 g; Inject 0.5 g into the muscle once.  Yeast vaginitis  Type 2 diabetes mellitus with hyperglycemia, with long-term current use of insulin (HCC)  Other orders -     nitrofurantoin, macrocrystal-monohydrate, (MACROBID) 100 MG capsule; Take 1 capsule (100 mg total) by mouth 2 (two) times daily. -     phenazopyridine  (PYRIDIUM) 200 MG tablet; Take 1 tablet (200 mg total) by mouth 4 (four) times daily as needed for pain (urinary frequency &/or pain).   I am having [name] start on nitrofurantoin (macrocrystal-monohydrate) and phenazopyridine. I am also having her maintain her MULTI FOR HER 50+, Calcium Carbonate-Vitamin D (CALCIUM 600 + D PO), aspirin EC, glucose blood, ONETOUCH DELICA LANCETS 09O, Pen Needles, atenolol, hydrochlorothiazide, dexlansoprazole, levothyroxine, pravastatin, zolpidem, escitalopram, Dulaglutide, levothyroxine, clonazePAM, linagliptin, and Insulin Degludec. We will continue to administer cefTRIAXone.  Meds ordered this encounter  Medications  . cefTRIAXone (ROCEPHIN) injection 0.5 g  . nitrofurantoin, macrocrystal-monohydrate, (MACROBID) 100 MG capsule    Sig: Take 1 capsule (100 mg total) by mouth 2 (two) times daily.    Dispense:  14 capsule    Refill:  0  . phenazopyridine (PYRIDIUM) 200 MG tablet    Sig: Take 1 tablet (200  mg total) by mouth 4 (four) times daily as needed for pain (urinary frequency &/or pain).    Dispense:  12 tablet    Refill:  0     Follow-up: Return if symptoms worsen or fail to improve.  Claretta Fraise, M.D.

## 2016-10-11 LAB — URINALYSIS
BILIRUBIN UA: NEGATIVE
Glucose, UA: NEGATIVE
KETONES UA: NEGATIVE
Nitrite, UA: NEGATIVE
PH UA: 5.5 (ref 5.0–7.5)
Urobilinogen, Ur: 0.2 mg/dL (ref 0.2–1.0)

## 2016-11-09 ENCOUNTER — Other Ambulatory Visit: Payer: BLUE CROSS/BLUE SHIELD

## 2016-11-09 ENCOUNTER — Other Ambulatory Visit: Payer: Self-pay

## 2016-11-09 DIAGNOSIS — E1165 Type 2 diabetes mellitus with hyperglycemia: Secondary | ICD-10-CM

## 2016-11-09 DIAGNOSIS — E119 Type 2 diabetes mellitus without complications: Secondary | ICD-10-CM

## 2016-11-09 DIAGNOSIS — Z794 Long term (current) use of insulin: Secondary | ICD-10-CM

## 2016-11-09 LAB — BAYER DCA HB A1C WAIVED: HB A1C (BAYER DCA - WAIVED): 7.5 % — ABNORMAL HIGH (ref <7.0)

## 2016-11-09 NOTE — Addendum Note (Signed)
Addended by: Cherre Robins B on: 11/09/2016 12:05 PM   Modules accepted: Orders

## 2016-11-10 ENCOUNTER — Encounter: Payer: Self-pay | Admitting: Pulmonary Disease

## 2016-11-10 ENCOUNTER — Ambulatory Visit (INDEPENDENT_AMBULATORY_CARE_PROVIDER_SITE_OTHER): Payer: BLUE CROSS/BLUE SHIELD | Admitting: Pulmonary Disease

## 2016-11-10 VITALS — BP 116/80 | HR 81 | Ht 65.0 in | Wt 281.6 lb

## 2016-11-10 DIAGNOSIS — G4733 Obstructive sleep apnea (adult) (pediatric): Secondary | ICD-10-CM

## 2016-11-10 DIAGNOSIS — Z6841 Body Mass Index (BMI) 40.0 and over, adult: Secondary | ICD-10-CM

## 2016-11-10 DIAGNOSIS — Z9989 Dependence on other enabling machines and devices: Secondary | ICD-10-CM

## 2016-11-10 NOTE — Progress Notes (Signed)
Current Outpatient Prescriptions on File Prior to Visit  Medication Sig  . aspirin EC 81 MG tablet Take 1 tablet (81 mg total) by mouth daily.  Marland Kitchen atenolol (TENORMIN) 100 MG tablet Take 1 tablet (100 mg total) by mouth daily.  . Calcium Carbonate-Vitamin D (CALCIUM 600 + D PO) Take 1 tablet by mouth daily.   . clonazePAM (KLONOPIN) 0.5 MG tablet Take 1 tablet (0.5 mg total) by mouth 2 (two) times daily as needed.  Marland Kitchen dexlansoprazole (DEXILANT) 60 MG capsule TAKE 1 CAPSULE (60 MG TOTAL) BY MOUTH 2 (TWO) TIMES DAILY.  . Dulaglutide (TRULICITY) 1.5 SW/5.4OE SOPN Inject 1.5 mg into the skin once a week.  . escitalopram (LEXAPRO) 20 MG tablet Take 1 tablet (20 mg total) by mouth daily. (Patient taking differently: Take 5 mg by mouth every other day. )  . glucose blood (ONETOUCH VERIO) test strip USE TO CHECK BG 1 TO 2 TIMES DAILY  . hydrochlorothiazide (HYDRODIURIL) 25 MG tablet TAKE 1 TABLET (25 MG TOTAL) BY MOUTH DAILY.  Marland Kitchen Insulin Degludec (TRESIBA FLEXTOUCH) 200 UNIT/ML SOPN Inject 40 Units into the skin daily.  . Insulin Pen Needle (PEN NEEDLES) 31G X 5 MM MISC 1 each by Does not apply route daily. [name] tresiba injection  Dx E11.9  . levothyroxine (SYNTHROID, LEVOTHROID) 175 MCG tablet Take 1 tablet (175 mcg total) by mouth daily. Except Sunday.  Takes 200 mg tab on Sunday.  . levothyroxine (SYNTHROID, LEVOTHROID) 200 MCG tablet Take one tablet by mouth on Sundays only  . linagliptin (TRADJENTA) 5 MG TABS tablet Take 1 tablet (5 mg total) by mouth daily.  [name] DELICA LANCETS 70J MISC Use to check BG 1 to 2 times daily  . pravastatin (PRAVACHOL) 40 MG tablet TAKE 1 TABLET (40 MG TOTAL) BY MOUTH EVERY EVENING.  Marland Kitchen zolpidem (AMBIEN) 10 MG tablet Take 1 tablet (10 mg total) by mouth at bedtime as needed.   No current facility-administered medications on file prior to visit.      Chief Complaint  Patient presents with  . Follow-up    Pt uses a cpap machine, she uses her machine every night,  She is in need of supplies and she wants to discuss obtaining a new machine, has some trouble with the pressure [name]      Sleep tests PSG 08/03/12 >> AHI 108.6, SpO2 low 83%; CPAP 11 cm H2O >> AHI 4.3, +R, +S, PLMI 6 >>3.5. CPAP 10/11/16 to 11/09/16 >> used on 30 of 30 nights with average 8 hrs 57 min.  CPAP 18 cm H2O.  Pulmonary tests Barium esophogram 07/05/12 >> Prominent, spontaneous GERD reaching upper thoracic esophagus CXR 07/19/12 >> small hiatal hernia, otherwise normal PFT 08/01/12 >> FEV1 2.50 (87%), FEV1% 85, TLC 5.04 (92%), DLCO 76%  Past medical history Anxiety, Depression, DM, GERD, HH, HLD, HTN, IBS, Migraines, Positive PPD 1995, Hyperthyroidism s/p radio iodine, PUD  Past surgical history, Family history, Social history, Allergies all reviewed.  Vital Signs BP 116/80 (BP Location: Left Arm, Patient Position: Sitting, Cuff Size: Normal)   Pulse 81   Ht 5\' 5"  (1.651 m)   Wt 281 lb 9.6 oz (127.7 kg)   LMP 05/24/1986 (Approximate)   SpO2 96%   BMI 46.86 kg/m   History of Present Illness [name] is a 61 y.o. female with obstructive sleep apnea.  She is doing well with CPAP.  Only issue is with mask fit.  She has full face mask.  Has to get mask  tight to fit.    Physical Exam  General - No distress ENT - No sinus tenderness, no oral exudate, no LAN Cardiac - s1s2 regular, no murmur Chest - No wheeze/rales/dullness Back - No focal tenderness Abd - Soft, non-tender Ext - No edema Neuro - Normal strength Skin - No rashes Psych - normal mood, and behavior   Assessment/Plan  Obstructive sleep apnea. - she is compliant with CPAP and reports benefit from therapy - continue CPAP 18 cm H2O - she will likely be eligible for new CPAP next year - discussed options for mask fit  Obesity. - discussed options to assist with weight loss   Patient Instructions  Can look up CPAP mask options at [email or web address], or similar web site  Have sent  order for new CPAP mask and supplies  Follow up in 1 year    Olivia Mires, MD [facility] Pager:  [phone] 11/10/2016, 2:03 PM

## 2016-11-10 NOTE — Patient Instructions (Signed)
Can look up CPAP mask options at [email or web address], or similar web site  Have sent order for new CPAP mask and supplies  Follow up in 1 year

## 2016-11-18 ENCOUNTER — Other Ambulatory Visit: Payer: Self-pay | Admitting: Nurse Practitioner

## 2016-11-18 ENCOUNTER — Telehealth: Payer: Self-pay | Admitting: Nurse Practitioner

## 2016-11-18 MED ORDER — NITROFURANTOIN MONOHYD MACRO 100 MG PO CAPS: 100.0000 mg | ORAL_CAPSULE | Freq: Two times a day (BID) | ORAL | 0 refills | Status: DC

## 2016-11-23 ENCOUNTER — Other Ambulatory Visit: Payer: Self-pay | Admitting: Nurse Practitioner

## 2016-11-23 DIAGNOSIS — E1165 Type 2 diabetes mellitus with hyperglycemia: Secondary | ICD-10-CM

## 2016-11-30 ENCOUNTER — Ambulatory Visit (INDEPENDENT_AMBULATORY_CARE_PROVIDER_SITE_OTHER): Payer: BLUE CROSS/BLUE SHIELD | Admitting: Nurse Practitioner

## 2016-11-30 ENCOUNTER — Encounter: Payer: Self-pay | Admitting: Nurse Practitioner

## 2016-11-30 VITALS — BP 117/83 | HR 74 | Temp 97.4°F | Ht 65.0 in | Wt 266.0 lb

## 2016-11-30 DIAGNOSIS — E032 Hypothyroidism due to medicaments and other exogenous substances: Secondary | ICD-10-CM

## 2016-11-30 DIAGNOSIS — I10 Essential (primary) hypertension: Secondary | ICD-10-CM

## 2016-11-30 DIAGNOSIS — G4733 Obstructive sleep apnea (adult) (pediatric): Secondary | ICD-10-CM | POA: Diagnosis not present

## 2016-11-30 DIAGNOSIS — K219 Gastro-esophageal reflux disease without esophagitis: Secondary | ICD-10-CM | POA: Diagnosis not present

## 2016-11-30 DIAGNOSIS — N390 Urinary tract infection, site not specified: Secondary | ICD-10-CM | POA: Diagnosis not present

## 2016-11-30 DIAGNOSIS — F5101 Primary insomnia: Secondary | ICD-10-CM

## 2016-11-30 DIAGNOSIS — E1165 Type 2 diabetes mellitus with hyperglycemia: Secondary | ICD-10-CM | POA: Diagnosis not present

## 2016-11-30 DIAGNOSIS — Z794 Long term (current) use of insulin: Secondary | ICD-10-CM

## 2016-11-30 DIAGNOSIS — F411 Generalized anxiety disorder: Secondary | ICD-10-CM | POA: Diagnosis not present

## 2016-11-30 DIAGNOSIS — E039 Hypothyroidism, unspecified: Secondary | ICD-10-CM

## 2016-11-30 DIAGNOSIS — F3342 Major depressive disorder, recurrent, in full remission: Secondary | ICD-10-CM

## 2016-11-30 DIAGNOSIS — E785 Hyperlipidemia, unspecified: Secondary | ICD-10-CM

## 2016-11-30 LAB — URINALYSIS, COMPLETE
Bilirubin, UA: NEGATIVE
GLUCOSE, UA: NEGATIVE
Ketones, UA: NEGATIVE
Nitrite, UA: NEGATIVE
PROTEIN UA: NEGATIVE
RBC, UA: NEGATIVE
Specific Gravity, UA: 1.02 (ref 1.005–1.030)
Urobilinogen, Ur: 0.2 mg/dL (ref 0.2–1.0)
pH, UA: 5.5 (ref 5.0–7.5)

## 2016-11-30 LAB — BAYER DCA HB A1C WAIVED: HB A1C: 7.4 % — AB (ref <7.0)

## 2016-11-30 LAB — MICROSCOPIC EXAMINATION: Renal Epithel, UA: NONE SEEN /hpf

## 2016-11-30 MED ORDER — PEN NEEDLES 31G X 5 MM MISC
1.0000 | Freq: Every day | 11 refills | Status: DC
Start: 2016-11-30 — End: 2018-02-10

## 2016-11-30 MED ORDER — ZOLPIDEM TARTRATE 10 MG PO TABS: 10.0000 mg | ORAL_TABLET | Freq: Every evening | ORAL | 2 refills | Status: DC | PRN

## 2016-11-30 MED ORDER — GLUCOSE BLOOD VI STRP: ORAL_STRIP | 0 refills | Status: DC

## 2016-11-30 MED ORDER — INSULIN DEGLUDEC 200 UNIT/ML ~~LOC~~ SOPN: 40.0000 [IU] | PEN_INJECTOR | Freq: Every day | SUBCUTANEOUS | 1 refills | Status: DC

## 2016-11-30 MED ORDER — LEVOTHYROXINE SODIUM 175 MCG PO TABS: 175.0000 ug | ORAL_TABLET | Freq: Every day | ORAL | 1 refills | Status: DC

## 2016-11-30 MED ORDER — CLONAZEPAM 0.5 MG PO TABS: 0.5000 mg | ORAL_TABLET | Freq: Two times a day (BID) | ORAL | 2 refills | Status: DC | PRN

## 2016-11-30 MED ORDER — CIPROFLOXACIN HCL 500 MG PO TABS: 500.0000 mg | ORAL_TABLET | Freq: Two times a day (BID) | ORAL | 0 refills | Status: DC

## 2016-11-30 NOTE — Patient Instructions (Signed)
Stress and Stress Management Stress is a normal reaction to life events. It is what you feel when life demands more than you are used to or more than you can handle. Some stress can be useful. For example, the stress reaction can help you catch the last bus of the day, study for a test, or meet a deadline at work. But stress that occurs too often or for too long can cause problems. It can affect your emotional health and interfere with relationships and normal daily activities. Too much stress can weaken your immune system and increase your risk for physical illness. If you already have a medical problem, stress can make it worse. What are the causes? All sorts of life events may cause stress. An event that causes stress for one person may not be stressful for another person. Major life events commonly cause stress. These may be positive or negative. Examples include losing your job, moving into a new home, getting married, having a baby, or losing a loved one. Less obvious life events may also cause stress, especially if they occur day after day or in combination. Examples include working long hours, driving in traffic, caring for children, being in debt, or being in a difficult relationship. What are the signs or symptoms? Stress may cause emotional symptoms including, the following:  Anxiety. This is feeling worried, afraid, on edge, overwhelmed, or out of control.  Anger. This is feeling irritated or impatient.  Depression. This is feeling sad, down, helpless, or guilty.  Difficulty focusing, remembering, or making decisions.  Stress may cause physical symptoms, including the following:  Aches and pains. These may affect your head, neck, back, stomach, or other areas of your body.  Tight muscles or clenched jaw.  Low energy or trouble sleeping.  Stress may cause unhealthy behaviors, including the following:  Eating to feel better (overeating) or skipping meals.  Sleeping too little,  too much, or both.  Working too much or putting off tasks (procrastination).  Smoking, drinking alcohol, or using drugs to feel better.  How is this diagnosed? Stress is diagnosed through an assessment by your health care provider. Your health care provider will ask questions about your symptoms and any stressful life events.Your health care provider will also ask about your medical history and may order blood tests or other tests. Certain medical conditions and medicine can cause physical symptoms similar to stress. Mental illness can cause emotional symptoms and unhealthy behaviors similar to stress. Your health care provider may refer you to a mental health professional for further evaluation. How is this treated? Stress management is the recommended treatment for stress.The goals of stress management are reducing stressful life events and coping with stress in healthy ways. Techniques for reducing stressful life events include the following:  Stress identification. Self-monitor for stress and identify what causes stress for you. These skills may help you to avoid some stressful events.  Time management. Set your priorities, keep a calendar of events, and learn to say "no." These tools can help you avoid making too many commitments.  Techniques for coping with stress include the following:  Rethinking the problem. Try to think realistically about stressful events rather than ignoring them or overreacting. Try to find the positives in a stressful situation rather than focusing on the negatives.  Exercise. Physical exercise can release both physical and emotional tension. The key is to find a form of exercise you enjoy and do it regularly.  Relaxation techniques. These relax the body and  mind. Examples include yoga, meditation, tai chi, biofeedback, deep breathing, progressive muscle relaxation, listening to music, being out in nature, journaling, and other hobbies. Again, the key is to find  one or more that you enjoy and can do regularly.  Healthy lifestyle. Eat a balanced diet, get plenty of sleep, and do not smoke. Avoid using alcohol or drugs to relax.  Strong support network. Spend time with family, friends, or other people you enjoy being around.Express your feelings and talk things over with someone you trust.  Counseling or talktherapy with a mental health professional may be helpful if you are having difficulty managing stress on your own. Medicine is typically not recommended for the treatment of stress.Talk to your health care provider if you think you need medicine for symptoms of stress. Follow these instructions at home:  Keep all follow-up visits as directed by your health care provider.  Take all medicines as directed by your health care provider. Contact a health care provider if:  Your symptoms get worse or you start having new symptoms.  You feel overwhelmed by your problems and can no longer manage them on your own. Get help right away if:  You feel like hurting yourself or someone else. This information is not intended to replace advice given to you by your health care provider. Make sure you discuss any questions you have with your health care provider. Document Released: 11/03/2000 Document Revised: 10/16/2015 Document Reviewed: 01/02/2013 Elsevier Interactive Patient Education  2017 [employer] Inc[employer]  

## 2016-11-30 NOTE — Progress Notes (Signed)
Subjective:    Patient ID: [name], female    DOB: 31-Aug-1955, 61 y.o.   MRN: [id number]  HPI [name] is here today for follow up of chronic medical problem.  Outpatient Encounter Prescriptions as of 11/30/2016  Medication Sig  . aspirin EC 81 MG tablet Take 1 tablet (81 mg total) by mouth daily.  Marland Kitchen atenolol (TENORMIN) 100 MG tablet Take 1 tablet (100 mg total) by mouth daily.  . Calcium Carbonate-Vitamin D (CALCIUM 600 + D PO) Take 1 tablet by mouth daily.   . clonazePAM (KLONOPIN) 0.5 MG tablet Take 1 tablet (0.5 mg total) by mouth 2 (two) times daily as needed.  Marland Kitchen dexlansoprazole (DEXILANT) 60 MG capsule TAKE 1 CAPSULE (60 MG TOTAL) BY MOUTH 2 (TWO) TIMES DAILY.  . Dulaglutide (TRULICITY) 1.5 HT/0.9PJ SOPN Inject 1.5 mg into the skin once a week.  . escitalopram (LEXAPRO) 20 MG tablet Take 1 tablet (20 mg total) by mouth daily. (Patient taking differently: Take 5 mg by mouth every other day. )  . glucose blood (ONETOUCH VERIO) test strip USE TO CHECK BG 1 TO 2 TIMES DAILY  . hydrochlorothiazide (HYDRODIURIL) 25 MG tablet TAKE 1 TABLET (25 MG TOTAL) BY MOUTH DAILY.  Marland Kitchen Insulin Degludec (TRESIBA FLEXTOUCH) 200 UNIT/ML SOPN Inject 40 Units into the skin daily.  . Insulin Pen Needle (PEN NEEDLES) 31G X 5 MM MISC 1 each by Does not apply route daily. Duffy Bruce tresiba injection  Dx E11.9  . levothyroxine (SYNTHROID, LEVOTHROID) 175 MCG tablet Take 1 tablet (175 mcg total) by mouth daily. Except Sunday.  Takes 200 mg tab on Sunday.  . linagliptin (TRADJENTA) 5 MG TABS tablet Take 1 tablet (5 mg total) by mouth daily.  [name] DELICA LANCETS 12T MISC Use to check BG 1 to 2 times daily  . pravastatin (PRAVACHOL) 40 MG tablet TAKE 1 TABLET (40 MG TOTAL) BY MOUTH EVERY EVENING.  . TRULICITY 1.5 KK/4.4CX SOPN INJECT 1.5 MG INTO THE SKIN ONCE A WEEK.  Marland Kitchen zolpidem (AMBIEN) 10 MG tablet Take 1 tablet (10 mg total) by mouth at bedtime as needed.  . [DISCONTINUED] levothyroxine  (SYNTHROID, LEVOTHROID) 200 MCG tablet Take one tablet by mouth on Sundays only  . [DISCONTINUED] nitrofurantoin, macrocrystal-monohydrate, (MACROBID) 100 MG capsule Take 1 capsule (100 mg total) by mouth 2 (two) times daily. 1 po BId   No facility-administered encounter medications on file as of 11/30/2016.     1. Essential hypertension  Blood pressure managed with atenolol and HCTZ.    2. Hyperlipidemia, unspecified hyperlipidemia type  Patient taking pravastatin.  3. Hypothyroidism, unspecified type  Levothyroxine for symptom management.  4. Type 2 diabetes mellitus with hyperglycemia, with long-term current use of insulin ([place])  Blood glucose managed with Trulicity, Tradjenta, and Tresiba.  Patient checks blood glucose and averages 195 over the last 90 days.  5. Severe obesity (BMI >= 40) (HCC)  No recent weight gain or loss.  6. Urinary tract infection without hematuria, site unspecified  Patient called last week for antibiotic for UTI.  No current symptoms.  Patient has completed her antibiotics.  7. GAD (generalized anxiety disorder)  Patient takes escitalopram for symptom management.  8. Primary insomnia  No current issues.  Ambien helps patient sleep.  9. OSA (obstructive sleep apnea)  No current issues.  10. Gastroesophageal reflux disease without esophagitis  Patient symptoms well-managed with dexlansoprazole.  11. Recurrent major depressive disorder, in full remission [facility])  Patient manages with daily escitalopram.  New complaints: None today.    Review of Systems  Constitutional: Negative for activity change, appetite change and fatigue.  Respiratory: Negative for cough, shortness of breath and wheezing.   Cardiovascular: Negative for chest pain and palpitations.  Gastrointestinal: Negative for abdominal distention and abdominal pain.  Neurological: Negative for dizziness, light-headedness and headaches.  All other systems reviewed and are negative.        Objective:   Physical Exam  Constitutional: She is oriented to person, place, and time. She appears well-developed and well-nourished. No distress.  HENT:  Head: Normocephalic.  Right Ear: External ear normal.  Left Ear: External ear normal.  Mouth/Throat: Oropharynx is clear and moist.  Eyes: Pupils are equal, round, and reactive to light.  Neck: Normal range of motion. Neck supple. No JVD present. No thyromegaly present.  Cardiovascular: Normal rate, regular rhythm, normal heart sounds and intact distal pulses.   No murmur heard. Pulmonary/Chest: Effort normal and breath sounds normal. No respiratory distress.  Abdominal: Soft. Bowel sounds are normal. She exhibits no distension. There is no tenderness.  Musculoskeletal: Normal range of motion. She exhibits no edema.  Lymphadenopathy:    She has no cervical adenopathy.  Neurological: She is alert and oriented to person, place, and time.  Skin: Skin is warm and dry.  Psychiatric: She has a normal mood and affect. Her behavior is normal. Judgment and thought content normal.   BP 117/83   Pulse 74   Temp (!) 97.4 F (36.3 C) (Oral)   Ht '5\' 5"'  (1.651 m)   Wt 266 lb (120.7 kg)   LMP 05/24/1986 (Approximate)   BMI 44.26 kg/m  A1C: 7.4%     Assessment & Plan:  1. Essential hypertension Low sodium diet - CMP14+EGFR - Lipid panel  2. Hyperlipidemia, unspecified hyperlipidemia type Low fat diet - CMP14+EGFR - Lipid panel  3. Hypothyroidism, unspecified type - CMP14+EGFR - Lipid panel - Thyroid Panel With TSH  4. Type 2 diabetes mellitus with hyperglycemia, with long-term current use of insulin (HCC) Continue to watch carbs in diet - Microalbumin / creatinine urine ratio - CMP14+EGFR - Lipid panel - Bayer DCA Hb A1c Waived - Insulin Degludec (TRESIBA FLEXTOUCH) 200 UNIT/ML SOPN; Inject 40 Units into the skin daily.  Dispense: 18 mL; Refill: 1 - Insulin Pen Needle (PEN NEEDLES) 31G X 5 MM MISC; 1 each by Does not apply  route daily. Duffy Bruce tresiba injection  Dx E11.9  Dispense: 100 each; Refill: 11 - glucose blood (ONETOUCH VERIO) test strip; USE TO CHECK BG 1 TO 2 TIMES DAILY  Dispense: 300 each; Refill: 0  5. Severe obesity (BMI >= 40) (HCC) Discussed diet and exercise for person with BMI >25 Will recheck weight in 3-6 months - CMP14+EGFR - Lipid panel - Bayer DCA Hb A1c Waived  6. Urinary tract infection without hematuria, site unspecified Take medication as prescribe Cotton underwear Take shower not bath Cranberry juice, yogurt Force fluids AZO over the counter X2 days Culture pending RTO prn - Urinalysis, Complete - ciprofloxacin (CIPRO) 500 MG tablet; Take 1 tablet (500 mg total) by mouth 2 (two) times daily.  Dispense: 20 tablet; Refill: 0  7. GAD (generalized anxiety disorder) Stress management - clonazePAM (KLONOPIN) 0.5 MG tablet; Take 1 tablet (0.5 mg total) by mouth 2 (two) times daily as needed.  Dispense: 60 tablet; Refill: 2  8. Hypothyroidism due to medication - levothyroxine (SYNTHROID, LEVOTHROID) 175 MCG tablet; Take 1 tablet (175 mcg total) by mouth daily. Except Sunday.  Takes 200 mg tab on Sunday.  Dispense: 90 tablet; Refill: 1  9. Primary insomnia bedtinme routine - zolpidem (AMBIEN) 10 MG tablet; Take 1 tablet (10 mg total) by mouth at bedtime as needed.  Dispense: 30 tablet; Refill: 2  10. OSA (obstructive sleep apnea) Wear cpap  11. Gastroesophageal reflux disease without esophagitis Avoid spicy foods Do not eat 2 hours prior to bedtime  12. Recurrent major depressive disorder, in full remission [facility]) Stress management    Labs pending Health maintenance reviewed Diet and exercise encouraged Continue all meds Follow up  In 3 months   [place], FNP

## 2016-12-01 LAB — CMP14+EGFR
ALBUMIN: 4.1 g/dL (ref 3.6–4.8)
ALK PHOS: 86 IU/L (ref 39–117)
ALT: 16 IU/L (ref 0–32)
AST: 16 IU/L (ref 0–40)
Albumin/Globulin Ratio: 2.2 (ref 1.2–2.2)
BUN/Creatinine Ratio: 20 (ref 12–28)
BUN: 19 mg/dL (ref 8–27)
Bilirubin Total: 0.3 mg/dL (ref 0.0–1.2)
CALCIUM: 9.6 mg/dL (ref 8.7–10.3)
CO2: 31 mmol/L — AB (ref 20–29)
CREATININE: 0.93 mg/dL (ref 0.57–1.00)
Chloride: 96 mmol/L (ref 96–106)
GFR calc Af Amer: 77 mL/min/{1.73_m2} (ref >59)
GFR calc non Af Amer: 67 mL/min/{1.73_m2} (ref >59)
GLUCOSE: 172 mg/dL — AB (ref 65–99)
Globulin, Total: 1.9 g/dL (ref 1.5–4.5)
Potassium: 4.1 mmol/L (ref 3.5–5.2)
Sodium: 141 mmol/L (ref 134–144)
Total Protein: 6 g/dL (ref 6.0–8.5)

## 2016-12-01 LAB — LIPID PANEL
CHOLESTEROL TOTAL: 191 mg/dL (ref 100–199)
Chol/HDL Ratio: 5.6 ratio — ABNORMAL HIGH (ref 0.0–4.4)
HDL: 34 mg/dL — AB (ref >39)
LDL CALC: 91 mg/dL (ref 0–99)
TRIGLYCERIDES: 332 mg/dL — AB (ref 0–149)
VLDL CHOLESTEROL CAL: 66 mg/dL — AB (ref 5–40)

## 2016-12-01 LAB — MICROALBUMIN / CREATININE URINE RATIO
CREATININE, UR: 88.2 mg/dL
Microalb/Creat Ratio: 15.4 mg/g creat (ref 0.0–30.0)
Microalbumin, Urine: 13.6 ug/mL

## 2016-12-01 LAB — THYROID PANEL WITH TSH
Free Thyroxine Index: 3.5 (ref 1.2–4.9)
T3 Uptake Ratio: 27 % (ref 24–39)
T4, Total: 13.1 ug/dL — ABNORMAL HIGH (ref 4.5–12.0)
TSH: 0.408 u[IU]/mL — ABNORMAL LOW (ref 0.450–4.500)

## 2016-12-03 ENCOUNTER — Other Ambulatory Visit: Payer: Self-pay

## 2016-12-03 MED ORDER — ONETOUCH DELICA LANCETS 33G MISC: 3 refills | Status: DC

## 2016-12-20 ENCOUNTER — Telehealth: Payer: Self-pay | Admitting: *Deleted

## 2016-12-20 NOTE — Telephone Encounter (Signed)
Left message on voicemail to call and reschedule cancelled appointment. Mail letter °

## 2017-02-15 ENCOUNTER — Encounter: Payer: Self-pay | Admitting: Nurse Practitioner

## 2017-02-15 ENCOUNTER — Ambulatory Visit (INDEPENDENT_AMBULATORY_CARE_PROVIDER_SITE_OTHER): Payer: BLUE CROSS/BLUE SHIELD | Admitting: Nurse Practitioner

## 2017-02-15 VITALS — BP 112/64 | HR 78 | Temp 97.9°F | Ht 65.0 in | Wt 262.0 lb

## 2017-02-15 DIAGNOSIS — E782 Mixed hyperlipidemia: Secondary | ICD-10-CM

## 2017-02-15 DIAGNOSIS — F411 Generalized anxiety disorder: Secondary | ICD-10-CM | POA: Diagnosis not present

## 2017-02-15 DIAGNOSIS — G4700 Insomnia, unspecified: Secondary | ICD-10-CM

## 2017-02-15 DIAGNOSIS — Z23 Encounter for immunization: Secondary | ICD-10-CM

## 2017-02-15 DIAGNOSIS — G4733 Obstructive sleep apnea (adult) (pediatric): Secondary | ICD-10-CM

## 2017-02-15 DIAGNOSIS — I10 Essential (primary) hypertension: Secondary | ICD-10-CM

## 2017-02-15 DIAGNOSIS — E1165 Type 2 diabetes mellitus with hyperglycemia: Secondary | ICD-10-CM | POA: Diagnosis not present

## 2017-02-15 DIAGNOSIS — Z794 Long term (current) use of insulin: Secondary | ICD-10-CM

## 2017-02-15 DIAGNOSIS — E032 Hypothyroidism due to medicaments and other exogenous substances: Secondary | ICD-10-CM | POA: Diagnosis not present

## 2017-02-15 DIAGNOSIS — F3342 Major depressive disorder, recurrent, in full remission: Secondary | ICD-10-CM

## 2017-02-15 DIAGNOSIS — F5101 Primary insomnia: Secondary | ICD-10-CM | POA: Diagnosis not present

## 2017-02-15 DIAGNOSIS — K219 Gastro-esophageal reflux disease without esophagitis: Secondary | ICD-10-CM

## 2017-02-15 LAB — BAYER DCA HB A1C WAIVED: HB A1C (BAYER DCA - WAIVED): 8.3 % — ABNORMAL HIGH (ref <7.0)

## 2017-02-15 MED ORDER — ATENOLOL 100 MG PO TABS: 100.0000 mg | ORAL_TABLET | Freq: Every day | ORAL | 1 refills | Status: DC

## 2017-02-15 MED ORDER — ZOLPIDEM TARTRATE 10 MG PO TABS: 10.0000 mg | ORAL_TABLET | Freq: Every evening | ORAL | 2 refills | Status: DC | PRN

## 2017-02-15 MED ORDER — LEVOTHYROXINE SODIUM 175 MCG PO TABS: 175.0000 ug | ORAL_TABLET | Freq: Every day | ORAL | 1 refills | Status: DC

## 2017-02-15 MED ORDER — INSULIN DEGLUDEC 200 UNIT/ML ~~LOC~~ SOPN: 40.0000 [IU] | PEN_INJECTOR | Freq: Every day | SUBCUTANEOUS | 1 refills | Status: DC

## 2017-02-15 MED ORDER — LINAGLIPTIN 5 MG PO TABS: 5.0000 mg | ORAL_TABLET | Freq: Every day | ORAL | 1 refills | Status: DC

## 2017-02-15 MED ORDER — HYDROCHLOROTHIAZIDE 25 MG PO TABS: ORAL_TABLET | ORAL | 1 refills | Status: DC

## 2017-02-15 MED ORDER — ESCITALOPRAM OXALATE 20 MG PO TABS: 20.0000 mg | ORAL_TABLET | Freq: Every day | ORAL | 1 refills | Status: DC

## 2017-02-15 MED ORDER — DULAGLUTIDE 1.5 MG/0.5ML ~~LOC~~ SOAJ: 1.5000 mg | SUBCUTANEOUS | 1 refills | Status: DC

## 2017-02-15 MED ORDER — CLONAZEPAM 0.5 MG PO TABS: 0.5000 mg | ORAL_TABLET | Freq: Two times a day (BID) | ORAL | 2 refills | Status: DC | PRN

## 2017-02-15 MED ORDER — PRAVASTATIN SODIUM 40 MG PO TABS: ORAL_TABLET | ORAL | 1 refills | Status: DC

## 2017-02-15 MED ORDER — INSULIN DEGLUDEC 200 UNIT/ML ~~LOC~~ SOPN: 46.0000 [IU] | PEN_INJECTOR | Freq: Every day | SUBCUTANEOUS | 1 refills | Status: DC

## 2017-02-15 MED ORDER — DEXLANSOPRAZOLE 60 MG PO CPDR: DELAYED_RELEASE_CAPSULE | ORAL | 1 refills | Status: DC

## 2017-02-15 NOTE — Progress Notes (Signed)
Subjective:    Patient ID: [name], female    DOB: 07-Jul-1955, 61 y.o.   MRN: [id number]  HPI  [name] is here today for follow up of chronic medical problem.  Outpatient Encounter Prescriptions as of 02/15/2017  Medication Sig  . aspirin EC 81 MG tablet Take 1 tablet (81 mg total) by mouth daily.  Marland Kitchen atenolol (TENORMIN) 100 MG tablet Take 1 tablet (100 mg total) by mouth daily.  . Calcium Carbonate-Vitamin D (CALCIUM 600 + D PO) Take 1 tablet by mouth daily.   . ciprofloxacin (CIPRO) 500 MG tablet Take 1 tablet (500 mg total) by mouth 2 (two) times daily.  . clonazePAM (KLONOPIN) 0.5 MG tablet Take 1 tablet (0.5 mg total) by mouth 2 (two) times daily as needed.  Marland Kitchen dexlansoprazole (DEXILANT) 60 MG capsule TAKE 1 CAPSULE (60 MG TOTAL) BY MOUTH 2 (TWO) TIMES DAILY.  . Dulaglutide (TRULICITY) 1.5 QX/4.5WT SOPN Inject 1.5 mg into the skin once a week.  . escitalopram (LEXAPRO) 20 MG tablet Take 1 tablet (20 mg total) by mouth daily. (Patient taking differently: Take 5 mg by mouth every other day. )  . glucose blood (ONETOUCH VERIO) test strip USE TO CHECK BG 1 TO 2 TIMES DAILY  . hydrochlorothiazide (HYDRODIURIL) 25 MG tablet TAKE 1 TABLET (25 MG TOTAL) BY MOUTH DAILY.  Marland Kitchen Insulin Degludec (TRESIBA FLEXTOUCH) 200 UNIT/ML SOPN Inject 40 Units into the skin daily.  . Insulin Pen Needle (PEN NEEDLES) 31G X 5 MM MISC 1 each by Does not apply route daily. Duffy Bruce tresiba injection  Dx E11.9  . levothyroxine (SYNTHROID, LEVOTHROID) 175 MCG tablet Take 1 tablet (175 mcg total) by mouth daily. Except Sunday.  Takes 200 mg tab on Sunday.  . linagliptin (TRADJENTA) 5 MG TABS tablet Take 1 tablet (5 mg total) by mouth daily.  [name] DELICA LANCETS 88E MISC Use to check BG 1 to 2 times daily  . pravastatin (PRAVACHOL) 40 MG tablet TAKE 1 TABLET (40 MG TOTAL) BY MOUTH EVERY EVENING.  . TRULICITY 1.5 KC/0.0LK SOPN INJECT 1.5 MG INTO THE SKIN ONCE A WEEK.  Marland Kitchen zolpidem (AMBIEN) 10 MG tablet  Take 1 tablet (10 mg total) by mouth at bedtime as needed.   No facility-administered encounter medications on file as of 02/15/2017.     1. Essential hypertension  No c/o chest pain, SOB or headache. Does not check blood pressure at home  2. Hyperlipidemia, unspecified hyperlipidemia type  Does not watch diet  3. Type 2 diabetes mellitus with hyperglycemia, with long-term current use of insulin (HCC)  Last hgba1c was 7.4%. Her blood sugars have been averaging around 140-150 fasting. No low blood sugars that she is aware of.  4. OSA (obstructive sleep apnea)  Wears CPAP nighhtly  5. Gastroesophageal reflux disease without esophagitis  dexilant seems to be the only thing that is helping with her symptoms  6. Hypothyroidism, unspecified type  No problems that she is aware of- she sees Dr. Debbora Presto - endocrinologist  7. Severe obesity (BMI >= 40) (HCC)  No recent weight changes  8. Insomnia, unspecified type  Cannot sleep without ambien  9. GAD (generalized anxiety disorder)  She takes klonopin BID. Has very stressful life with her daughter who has history of opiod dependence.  10. Recurrent major depressive disorder, in full remission ([place])  Is on lexapro which helps her a lot.    New complaints: none  Social history: Lives with her husband. Her daughter has history of  opoid addiction and they have to get her medication and keep it at there house and take it to her 2x a day to make sure she takes it like she is suppose to.    Review of Systems  Constitutional: Negative for activity change and appetite change.  HENT: Negative.   Eyes: Negative for pain.  Respiratory: Negative for shortness of breath.   Cardiovascular: Negative for chest pain, palpitations and leg swelling.  Gastrointestinal: Negative for abdominal pain.  Endocrine: Negative for polydipsia.  Genitourinary: Negative.   Skin: Negative for rash.  Neurological: Negative for dizziness, weakness and headaches.    Hematological: Does not bruise/bleed easily.  Psychiatric/Behavioral: Negative.   All other systems reviewed and are negative.      Objective:   Physical Exam  Constitutional: She is oriented to person, place, and time. She appears well-developed and well-nourished.  HENT:  Nose: Nose normal.  Mouth/Throat: Oropharynx is clear and moist.  Eyes: EOM are normal.  Neck: Trachea normal, normal range of motion and full passive range of motion without pain. Neck supple. No JVD present. Carotid bruit is not present. No thyromegaly present.  Cardiovascular: Normal rate, regular rhythm, normal heart sounds and intact distal pulses.  Exam reveals no gallop and no friction rub.   No murmur heard. Pulmonary/Chest: Effort normal and breath sounds normal.  Abdominal: Soft. Bowel sounds are normal. She exhibits no distension and no mass. There is no tenderness.  Musculoskeletal: Normal range of motion.  Lymphadenopathy:    She has no cervical adenopathy.  Neurological: She is alert and oriented to person, place, and time. She has normal reflexes.  Skin: Skin is warm and dry.  Psychiatric: She has a normal mood and affect. Her behavior is normal. Judgment and thought content normal.    BP 112/64   Pulse 78   Temp 97.9 F (36.6 C) (Oral)   Ht '5\' 5"'  (1.651 m)   Wt 262 lb (118.8 kg)   LMP 05/24/1986 (Approximate)   BMI 43.60 kg/m        Assessment & Plan:  1. Essential hypertension Low sodium diet - CMP14+EGFR - atenolol (TENORMIN) 100 MG tablet; Take 1 tablet (100 mg total) by mouth daily.  Dispense: 90 tablet; Refill: 1 - hydrochlorothiazide (HYDRODIURIL) 25 MG tablet; TAKE 1 TABLET (25 MG TOTAL) BY MOUTH DAILY.  Dispense: 90 tablet; Refill: 1  2. Type 2 diabetes mellitus with hyperglycemia, with long-term current use of insulin (HCC) Stricter carb counting Increase tresiba to 46 u daily - Bayer DCA Hb A1c Waived - Dulaglutide (TRULICITY) 1.5 AS/5.0NL SOPN; Inject 1.5 mg into the  skin once a week.  Dispense: 6 mL; Refill: 1 - linagliptin (TRADJENTA) 5 MG TABS tablet; Take 1 tablet (5 mg total) by mouth daily.  Dispense: 90 tablet; Refill: 1 - Insulin Degludec (TRESIBA FLEXTOUCH) 200 UNIT/ML SOPN; Inject 46 Units into the skin daily.  Dispense: 18 mL; Refill: 1  3. OSA (obstructive sleep apnea) Continue CPAP  4. Gastroesophageal reflux disease without esophagitis Avoid spicy foods Do not eat 2 hours prior to bedtime - dexlansoprazole (DEXILANT) 60 MG capsule; TAKE 1 CAPSULE (60 MG TOTAL) BY MOUTH 2 (TWO) TIMES DAILY.  Dispense: 180 capsule; Refill: 1  5. Severe obesity (BMI >= 40) (HCC) Discussed diet and exercise for person with BMI >25 Will recheck weight in 3-6 months   6. Insomnia, unspecified type Bedtime routine  7. GAD (generalized anxiety disorder) Stress management - clonazePAM (KLONOPIN) 0.5 MG tablet; Take 1 tablet (  0.5 mg total) by mouth 2 (two) times daily as needed.  Dispense: 60 tablet; Refill: 2  8. Recurrent major depressive disorder, in full remission ([place]) - escitalopram (LEXAPRO) 20 MG tablet; Take 1 tablet (20 mg total) by mouth daily.  Dispense: 90 tablet; Refill: 1  9. Hypothyroidism due to medication - levothyroxine (SYNTHROID, LEVOTHROID) 175 MCG tablet; Take 1 tablet (175 mcg total) by mouth daily. Except Sunday.  Takes 200 mg tab on Sunday.  Dispense: 90 tablet; Refill: 1  10. Mixed hyperlipidemia Low fat diet - Lipid panel - pravastatin (PRAVACHOL) 40 MG tablet; TAKE 1 TABLET (40 MG TOTAL) BY MOUTH EVERY EVENING.  Dispense: 90 tablet; Refill: 1  11. Primary insomnia Bedtime routine - zolpidem (AMBIEN) 10 MG tablet; Take 1 tablet (10 mg total) by mouth at bedtime as needed.  Dispense: 30 tablet; Refill: 2    Labs pending Health maintenance reviewed Diet and exercise encouraged Continue all meds Follow up  In 3 months   [place], FNP

## 2017-02-16 LAB — CMP14+EGFR
A/G RATIO: 2.4 — AB (ref 1.2–2.2)
ALT: 17 IU/L (ref 0–32)
AST: 17 IU/L (ref 0–40)
Albumin: 3.9 g/dL (ref 3.6–4.8)
Alkaline Phosphatase: 92 IU/L (ref 39–117)
BUN/Creatinine Ratio: 20 (ref 12–28)
BUN: 15 mg/dL (ref 8–27)
Bilirubin Total: 0.2 mg/dL (ref 0.0–1.2)
CALCIUM: 9.2 mg/dL (ref 8.7–10.3)
CO2: 27 mmol/L (ref 20–29)
CREATININE: 0.76 mg/dL (ref 0.57–1.00)
Chloride: 98 mmol/L (ref 96–106)
GFR, EST AFRICAN AMERICAN: 97 mL/min/{1.73_m2} (ref >59)
GFR, EST NON AFRICAN AMERICAN: 84 mL/min/{1.73_m2} (ref >59)
GLOBULIN, TOTAL: 1.6 g/dL (ref 1.5–4.5)
GLUCOSE: 284 mg/dL — AB (ref 65–99)
Potassium: 4.5 mmol/L (ref 3.5–5.2)
SODIUM: 140 mmol/L (ref 134–144)
TOTAL PROTEIN: 5.5 g/dL — AB (ref 6.0–8.5)

## 2017-02-16 LAB — LIPID PANEL
CHOL/HDL RATIO: 5.6 ratio — AB (ref 0.0–4.4)
Cholesterol, Total: 162 mg/dL (ref 100–199)
HDL: 29 mg/dL — AB (ref >39)
LDL CALC: 82 mg/dL (ref 0–99)
TRIGLYCERIDES: 256 mg/dL — AB (ref 0–149)
VLDL Cholesterol Cal: 51 mg/dL — ABNORMAL HIGH (ref 5–40)

## 2017-04-13 ENCOUNTER — Other Ambulatory Visit: Payer: Self-pay | Admitting: *Deleted

## 2017-04-13 DIAGNOSIS — Z794 Long term (current) use of insulin: Principal | ICD-10-CM

## 2017-04-13 DIAGNOSIS — E1165 Type 2 diabetes mellitus with hyperglycemia: Secondary | ICD-10-CM

## 2017-04-13 MED ORDER — INSULIN DEGLUDEC 200 UNIT/ML ~~LOC~~ SOPN: 46.0000 [IU] | PEN_INJECTOR | Freq: Every day | SUBCUTANEOUS | 1 refills | Status: DC

## 2017-04-18 ENCOUNTER — Other Ambulatory Visit: Payer: Self-pay | Admitting: *Deleted

## 2017-05-10 ENCOUNTER — Ambulatory Visit: Payer: BLUE CROSS/BLUE SHIELD | Admitting: Nurse Practitioner

## 2017-05-10 ENCOUNTER — Encounter: Payer: Self-pay | Admitting: Nurse Practitioner

## 2017-05-10 VITALS — BP 144/78 | HR 78 | Temp 98.3°F | Ht 65.0 in | Wt 282.0 lb

## 2017-05-10 DIAGNOSIS — E785 Hyperlipidemia, unspecified: Secondary | ICD-10-CM | POA: Diagnosis not present

## 2017-05-10 DIAGNOSIS — G4733 Obstructive sleep apnea (adult) (pediatric): Secondary | ICD-10-CM | POA: Diagnosis not present

## 2017-05-10 DIAGNOSIS — F3342 Major depressive disorder, recurrent, in full remission: Secondary | ICD-10-CM | POA: Diagnosis not present

## 2017-05-10 DIAGNOSIS — G4700 Insomnia, unspecified: Secondary | ICD-10-CM | POA: Diagnosis not present

## 2017-05-10 DIAGNOSIS — Z794 Long term (current) use of insulin: Secondary | ICD-10-CM

## 2017-05-10 DIAGNOSIS — F5101 Primary insomnia: Secondary | ICD-10-CM

## 2017-05-10 DIAGNOSIS — K219 Gastro-esophageal reflux disease without esophagitis: Secondary | ICD-10-CM

## 2017-05-10 DIAGNOSIS — E1165 Type 2 diabetes mellitus with hyperglycemia: Secondary | ICD-10-CM | POA: Diagnosis not present

## 2017-05-10 DIAGNOSIS — F411 Generalized anxiety disorder: Secondary | ICD-10-CM

## 2017-05-10 DIAGNOSIS — E039 Hypothyroidism, unspecified: Secondary | ICD-10-CM | POA: Diagnosis not present

## 2017-05-10 DIAGNOSIS — I10 Essential (primary) hypertension: Secondary | ICD-10-CM | POA: Diagnosis not present

## 2017-05-10 LAB — BAYER DCA HB A1C WAIVED: HB A1C (BAYER DCA - WAIVED): 8.3 % — ABNORMAL HIGH (ref <7.0)

## 2017-05-10 MED ORDER — GLUCOSE BLOOD VI STRP: ORAL_STRIP | 1 refills | Status: DC

## 2017-05-10 MED ORDER — CLONAZEPAM 0.5 MG PO TABS: 0.5000 mg | ORAL_TABLET | Freq: Two times a day (BID) | ORAL | 2 refills | Status: DC | PRN

## 2017-05-10 MED ORDER — SITAGLIPTIN PHOSPHATE 100 MG PO TABS: 100.0000 mg | ORAL_TABLET | Freq: Every day | ORAL | 1 refills | Status: DC

## 2017-05-10 MED ORDER — ACCU-CHEK FASTCLIX LANCETS MISC: 1 refills | Status: DC

## 2017-05-10 MED ORDER — INSULIN DEGLUDEC 200 UNIT/ML ~~LOC~~ SOPN: 50.0000 [IU] | PEN_INJECTOR | Freq: Every day | SUBCUTANEOUS | 1 refills | Status: DC

## 2017-05-10 MED ORDER — INSULIN DEGLUDEC 200 UNIT/ML ~~LOC~~ SOPN: 46.0000 [IU] | PEN_INJECTOR | Freq: Every day | SUBCUTANEOUS | 1 refills | Status: DC

## 2017-05-10 MED ORDER — ZOLPIDEM TARTRATE 10 MG PO TABS: 10.0000 mg | ORAL_TABLET | Freq: Every evening | ORAL | 2 refills | Status: DC | PRN

## 2017-05-10 NOTE — Progress Notes (Signed)
Subjective:    Patient ID: [name], female    DOB: 02-Mar-1956, 60 y.o.   MRN: [id number]  HPI  [name] is here today for follow up of chronic medical problem.  Outpatient Encounter Medications as of 05/10/2017  Medication Sig  . aspirin EC 81 MG tablet Take 1 tablet (81 mg total) by mouth daily.  Marland Kitchen atenolol (TENORMIN) 100 MG tablet Take 1 tablet (100 mg total) by mouth daily.  . Calcium Carbonate-Vitamin D (CALCIUM 600 + D PO) Take 1 tablet by mouth daily.   . clonazePAM (KLONOPIN) 0.5 MG tablet Take 1 tablet (0.5 mg total) by mouth 2 (two) times daily as needed.  Marland Kitchen dexlansoprazole (DEXILANT) 60 MG capsule TAKE 1 CAPSULE (60 MG TOTAL) BY MOUTH 2 (TWO) TIMES DAILY.  . Dulaglutide (TRULICITY) 1.5 FV/4.9SW SOPN Inject 1.5 mg into the skin once a week.  . escitalopram (LEXAPRO) 20 MG tablet Take 1 tablet (20 mg total) by mouth daily.  Marland Kitchen glucose blood (ONETOUCH VERIO) test strip USE TO CHECK BG 1 TO 2 TIMES DAILY  . hydrochlorothiazide (HYDRODIURIL) 25 MG tablet TAKE 1 TABLET (25 MG TOTAL) BY MOUTH DAILY.  Marland Kitchen Insulin Degludec (TRESIBA FLEXTOUCH) 200 UNIT/ML SOPN Inject 46 Units into the skin daily.  . Insulin Pen Needle (PEN NEEDLES) 31G X 5 MM MISC 1 each by Does not apply route daily. [name] tresiba injection  Dx E11.9  . levothyroxine (SYNTHROID, LEVOTHROID) 175 MCG tablet Take 1 tablet (175 mcg total) by mouth daily. Except Sunday.  Takes 200 mg tab on Sunday.  . linagliptin (TRADJENTA) 5 MG TABS tablet Take 1 tablet (5 mg total) by mouth daily.  [name] DELICA LANCETS 96P MISC Use to check BG 1 to 2 times daily  . pravastatin (PRAVACHOL) 40 MG tablet TAKE 1 TABLET (40 MG TOTAL) BY MOUTH EVERY EVENING.  Marland Kitchen zolpidem (AMBIEN) 10 MG tablet Take 1 tablet (10 mg total) by mouth at bedtime as needed.     1. Essential hypertension  No c/o chest pain, sob or headache. Does not check blood pressures at home. BP Readings from Last 3 Encounters:  02/15/17 112/64  11/30/16  117/83  11/10/16 116/80     2 . Type 2 diabetes mellitus with hyperglycemia, with long-term current use  of insulin (HCC) last hgba1c was 8.3%. Increased tresiba to 46u. Blood sugars are some better but patient does not check them everyday. When she does check them they are around 160-190.  3. Hyperlipidemia, unspecified hyperlipidemia type  Does not watch diet at all.  4. OSA (obstructive sleep apnea)  Uses CPAP machine most nights  5. Gastroesophageal reflux disease without esophagitis  Takes dexilant daily  6. Hypothyroidism, unspecified type  No problems that she is aware of. Sees Dr. Chalmers Fowler  7. GAD (generalized anxiety disorder)  Stays stressed. Takes klonopin daily  8. Recurrent major depressive disorder, in full remission ([place])  Is on lexapro. Helps a lot. Does not feel depressed. Depression screen [facility] 2/9 05/10/2017 02/15/2017 11/30/2016  Decreased Interest 0 0 0  Down, Depressed, Hopeless 0 0 0  PHQ - 2 Score 0 0 0     9. Insomnia, unspecified type  Cannot sleep without her ambien  10. Severe obesity (BMI >= 40) (HCC)  No recent weight changes    New complaints: None today  Social history: Does not work. Has to check on her daughter daily and give her her meds    Review of Systems  Constitutional: Negative for activity  change and appetite change.  HENT: Negative.   Eyes: Negative for pain.  Respiratory: Negative for shortness of breath.   Cardiovascular: Negative for chest pain, palpitations and leg swelling.  Gastrointestinal: Negative for abdominal pain.  Endocrine: Negative for polydipsia.  Genitourinary: Negative.   Skin: Negative for rash.  Neurological: Negative for dizziness, weakness and headaches.  Hematological: Does not bruise/bleed easily.  Psychiatric/Behavioral: Negative.   All other systems reviewed and are negative.      Objective:   Physical Exam  Constitutional: She is oriented to person, place, and time. She appears well-developed and  well-nourished.  HENT:  Nose: Nose normal.  Mouth/Throat: Oropharynx is clear and moist.  Eyes: EOM are normal.  Neck: Trachea normal, normal range of motion and full passive range of motion without pain. Neck supple. No JVD present. Carotid bruit is not present. No thyromegaly present.  Cardiovascular: Normal rate, regular rhythm, normal heart sounds and intact distal pulses. Exam reveals no gallop and no friction rub.  No murmur heard. Pulmonary/Chest: Effort normal and breath sounds normal.  Abdominal: Soft. Bowel sounds are normal. She exhibits no distension and no mass. There is no tenderness.  Musculoskeletal: Normal range of motion.  Lymphadenopathy:    She has no cervical adenopathy.  Neurological: She is alert and oriented to person, place, and time. She has normal reflexes.  Skin: Skin is warm and dry.  Psychiatric: She has a normal mood and affect. Her behavior is normal. Judgment and thought content normal.   BP (!) 144/78   Pulse 78   Temp 98.3 F (36.8 C) (Oral)   Ht 5' 5" (1.651 m)   Wt 282 lb (127.9 kg)   LMP 05/24/1986 (Approximate)   BMI 46.93 kg/m   hgba1c 8.3%     Assessment & Plan:  .1. Essential hypertension Low sodium diet - CMP14+EGFR  2. Type 2 diabetes mellitus with hyperglycemia, with long-term current use of insulin (HCC) Stricter carb counting Increased tresiba to 50u daily Changed tradjenta to [place] because of insurance - Bayer DCA Hb A1c Waived - sitaGLIPtin (JANUVIA) 100 MG tablet; Take 1 tablet (100 mg total) by mouth daily.  Dispense: 90 tablet; Refill: 1 - ACCU-CHEK FASTCLIX LANCETS MISC; Use to chack blood sugar 2x a day and prn  Dx E11.9  Dispense: 300 each; Refill: 1 - glucose blood (ACCU-CHEK GUIDE) test strip; Check blood sugar 2x daily and prn  Dx. e11.9  Dispense: 300 each; Refill: 1 - Insulin Degludec (TRESIBA FLEXTOUCH) 200 UNIT/ML SOPN; Inject 50 Units into the skin daily.  Dispense: 27 mL; Refill: 1  3. Hyperlipidemia,  unspecified hyperlipidemia type Low fat diet - Lipid panel  4. OSA (obstructive sleep apnea) Continue to wear CPAP noghtly  5. Gastroesophageal reflux disease without esophagitis Avoid spicy foods Do not eat 2 hours prior to bedtime  6. Hypothyroidism, unspecified type Keep follow up appointmnets with Dr. Chalmers Fowler  7. GAD (generalized anxiety disorder) Stress management - clonazePAM (KLONOPIN) 0.5 MG tablet; Take 1 tablet (0.5 mg total) by mouth 2 (two) times daily as needed.  Dispense: 60 tablet; Refill: 2  8. Recurrent major depressive disorder, in full remission ([place]) Continue lexapro  9. Severe obesity (BMI >= 40) (HCC) Discussed diet and exercise for person with BMI >25 Will recheck weight in 3-6 months  10. Primary insomnia Bedtime routine - zolpidem (AMBIEN) 10 MG tablet; Take 1 tablet (10 mg total) by mouth at bedtime as needed.  Dispense: 30 tablet; Refill: 2    Labs  pending Health maintenance reviewed Diet and exercise encouraged Continue all meds Follow up  In 3 months   [place], FNP

## 2017-05-10 NOTE — Patient Instructions (Signed)

## 2017-05-11 ENCOUNTER — Telehealth: Payer: Self-pay | Admitting: Nurse Practitioner

## 2017-05-11 DIAGNOSIS — Z794 Long term (current) use of insulin: Principal | ICD-10-CM

## 2017-05-11 DIAGNOSIS — E1165 Type 2 diabetes mellitus with hyperglycemia: Secondary | ICD-10-CM

## 2017-05-11 LAB — LIPID PANEL
CHOL/HDL RATIO: 5.8 ratio — AB (ref 0.0–4.4)
CHOLESTEROL TOTAL: 186 mg/dL (ref 100–199)
HDL: 32 mg/dL — ABNORMAL LOW (ref >39)
LDL CALC: 112 mg/dL — AB (ref 0–99)
Triglycerides: 211 mg/dL — ABNORMAL HIGH (ref 0–149)
VLDL CHOLESTEROL CAL: 42 mg/dL — AB (ref 5–40)

## 2017-05-11 LAB — CMP14+EGFR
ALBUMIN: 4.1 g/dL (ref 3.6–4.8)
ALT: 17 IU/L (ref 0–32)
AST: 24 IU/L (ref 0–40)
Albumin/Globulin Ratio: 2 (ref 1.2–2.2)
Alkaline Phosphatase: 88 IU/L (ref 39–117)
BUN / CREAT RATIO: 17 (ref 12–28)
BUN: 14 mg/dL (ref 8–27)
Bilirubin Total: 0.5 mg/dL (ref 0.0–1.2)
CALCIUM: 9.2 mg/dL (ref 8.7–10.3)
CO2: 29 mmol/L (ref 20–29)
CREATININE: 0.83 mg/dL (ref 0.57–1.00)
Chloride: 99 mmol/L (ref 96–106)
GFR, EST AFRICAN AMERICAN: 87 mL/min/{1.73_m2} (ref >59)
GFR, EST NON AFRICAN AMERICAN: 76 mL/min/{1.73_m2} (ref >59)
GLOBULIN, TOTAL: 2.1 g/dL (ref 1.5–4.5)
GLUCOSE: 128 mg/dL — AB (ref 65–99)
Potassium: 3.7 mmol/L (ref 3.5–5.2)
SODIUM: 141 mmol/L (ref 134–144)
TOTAL PROTEIN: 6.2 g/dL (ref 6.0–8.5)

## 2017-05-11 MED ORDER — ACCU-CHEK FASTCLIX LANCETS MISC: 1 refills | Status: AC

## 2017-05-11 MED ORDER — GLUCOSE BLOOD VI STRP: ORAL_STRIP | 1 refills | Status: DC

## 2017-05-11 NOTE — Telephone Encounter (Signed)
Prescriptions sent to pharmacy as requested and pt is aware.

## 2017-06-29 ENCOUNTER — Other Ambulatory Visit: Payer: Self-pay | Admitting: Nurse Practitioner

## 2017-06-29 ENCOUNTER — Encounter: Payer: Self-pay | Admitting: Nurse Practitioner

## 2017-06-29 DIAGNOSIS — N390 Urinary tract infection, site not specified: Secondary | ICD-10-CM

## 2017-07-04 IMAGING — DX DG CHEST 2V
2 series · 2 of 2 positions shown · non-contrast
Comparison: 11/21/2012

CLINICAL DATA: Cough, congestion for 5 days, bronchitis

EXAM:
CHEST  2 VIEW

[chest pa]
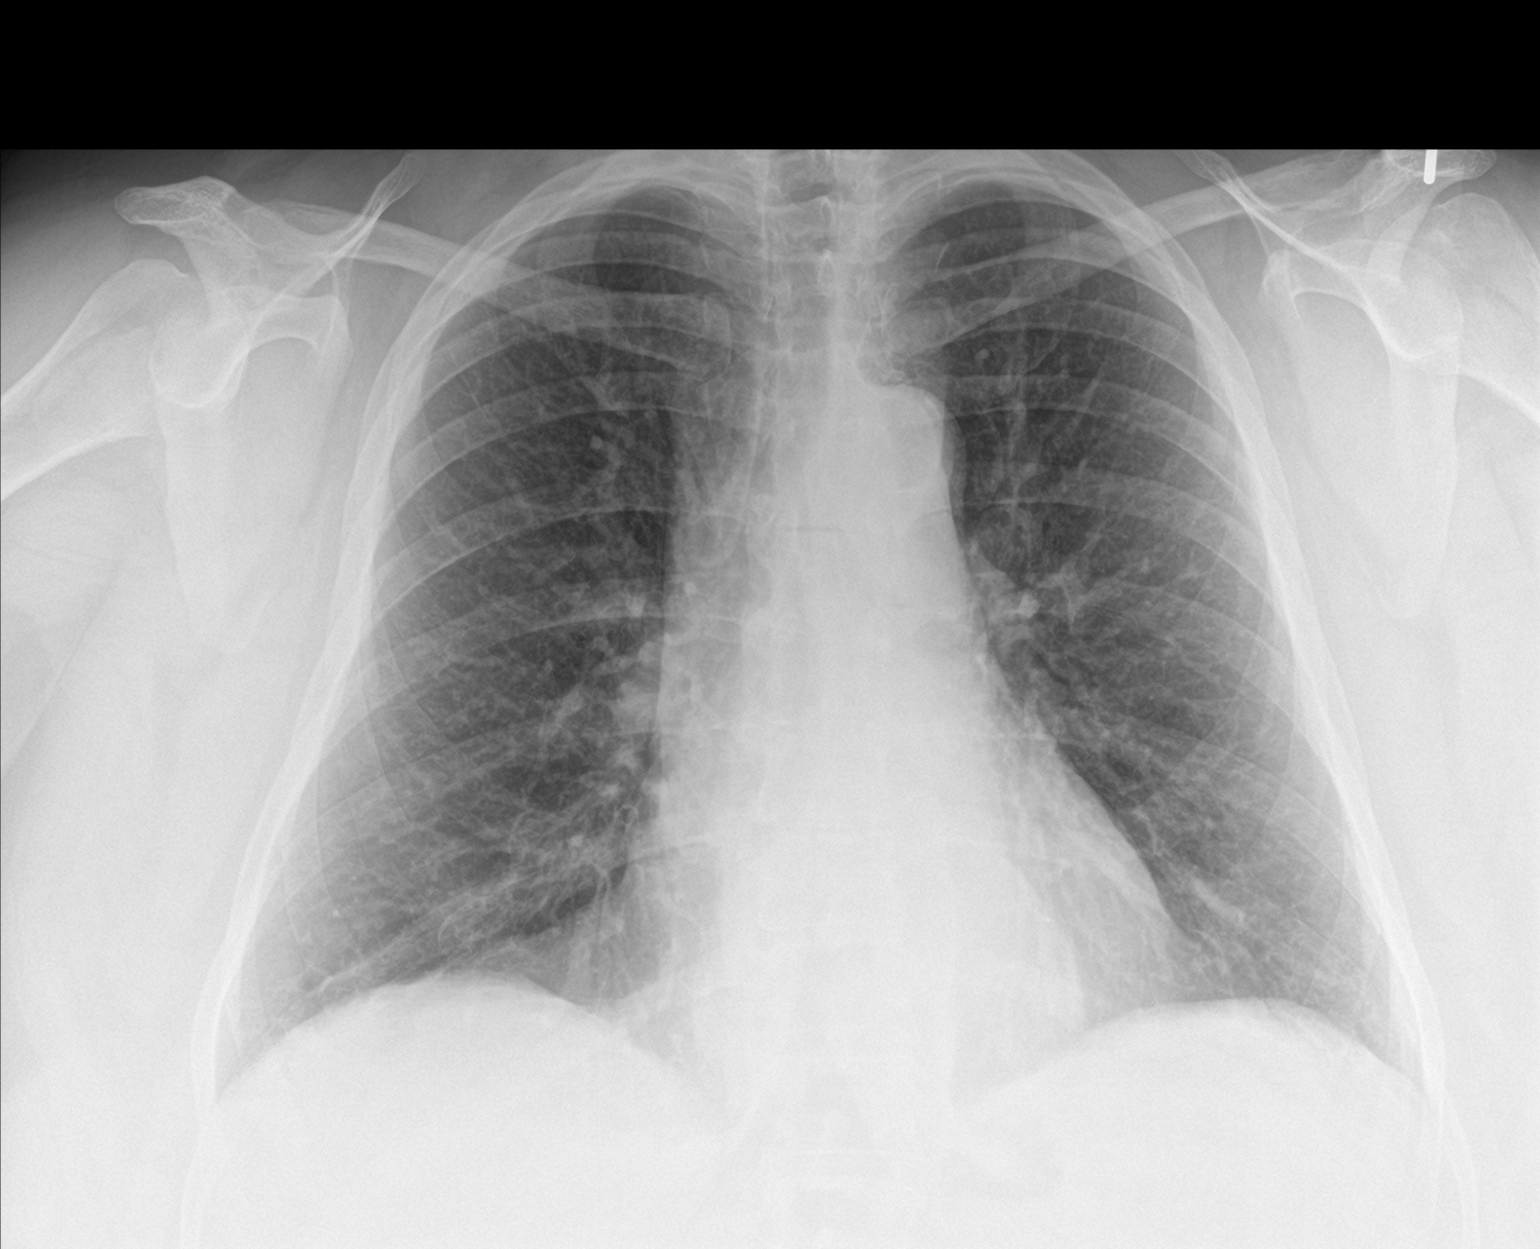

[chest lat]
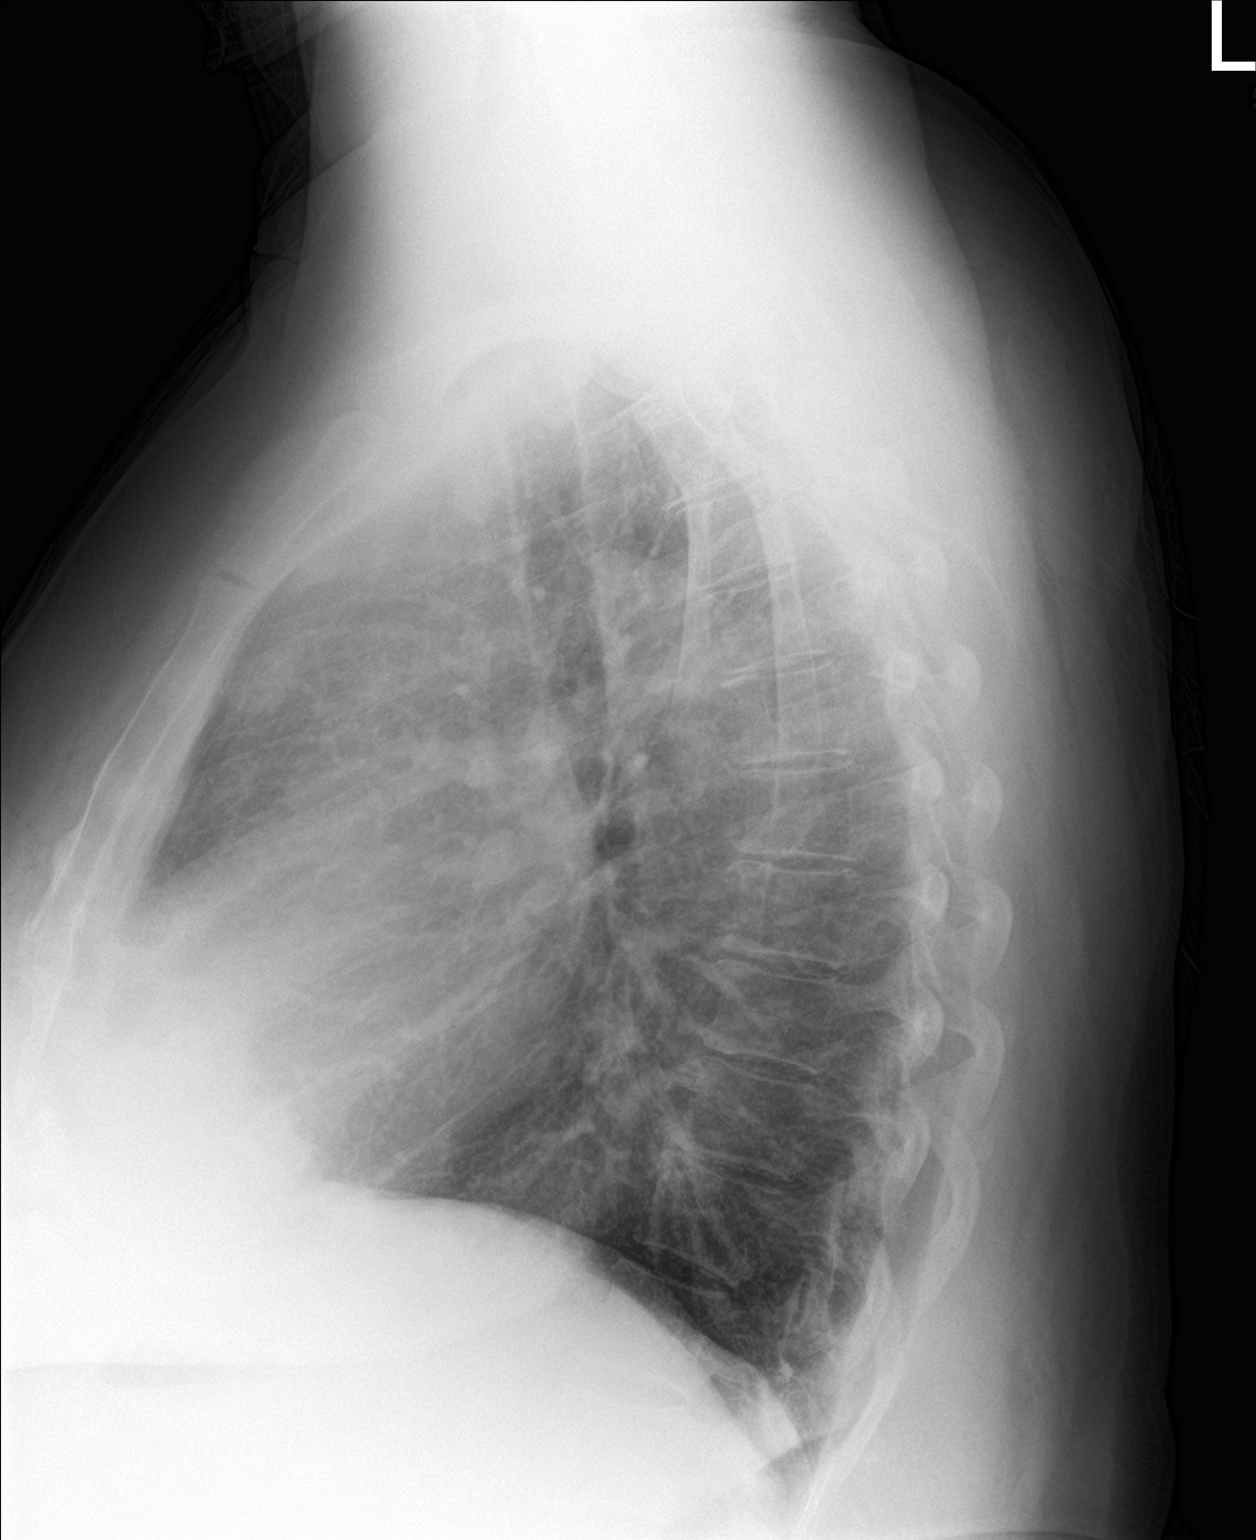

[2 of 2 positions shown; findings below may reference images not displayed]

FINDINGS: Cardiomediastinal silhouette is unremarkable. No infiltrate or
pulmonary edema. Mild perihilar bronchitic changes. Stable
small-to-moderate hiatal hernia. Mild degenerative changes thoracic
spine.
IMPRESSION: No infiltrate or pulmonary edema. Mild perihilar bronchitic changes.

## 2017-07-15 ENCOUNTER — Ambulatory Visit: Payer: BLUE CROSS/BLUE SHIELD | Admitting: Nurse Practitioner

## 2017-08-01 ENCOUNTER — Other Ambulatory Visit: Payer: Self-pay | Admitting: Nurse Practitioner

## 2017-08-03 ENCOUNTER — Ambulatory Visit: Payer: BLUE CROSS/BLUE SHIELD | Admitting: Certified Nurse Midwife

## 2017-08-09 ENCOUNTER — Encounter: Payer: Self-pay | Admitting: Nurse Practitioner

## 2017-08-09 ENCOUNTER — Telehealth: Payer: Self-pay | Admitting: Certified Nurse Midwife

## 2017-08-09 ENCOUNTER — Ambulatory Visit: Payer: BLUE CROSS/BLUE SHIELD | Admitting: Nurse Practitioner

## 2017-08-09 VITALS — BP 126/69 | HR 73 | Temp 98.7°F | Ht 65.0 in | Wt 285.0 lb

## 2017-08-09 DIAGNOSIS — E039 Hypothyroidism, unspecified: Secondary | ICD-10-CM | POA: Diagnosis not present

## 2017-08-09 DIAGNOSIS — Z794 Long term (current) use of insulin: Secondary | ICD-10-CM

## 2017-08-09 DIAGNOSIS — I10 Essential (primary) hypertension: Secondary | ICD-10-CM | POA: Diagnosis not present

## 2017-08-09 DIAGNOSIS — G4700 Insomnia, unspecified: Secondary | ICD-10-CM | POA: Diagnosis not present

## 2017-08-09 DIAGNOSIS — E785 Hyperlipidemia, unspecified: Secondary | ICD-10-CM | POA: Diagnosis not present

## 2017-08-09 DIAGNOSIS — E1165 Type 2 diabetes mellitus with hyperglycemia: Secondary | ICD-10-CM | POA: Diagnosis not present

## 2017-08-09 DIAGNOSIS — F3342 Major depressive disorder, recurrent, in full remission: Secondary | ICD-10-CM

## 2017-08-09 DIAGNOSIS — K219 Gastro-esophageal reflux disease without esophagitis: Secondary | ICD-10-CM | POA: Diagnosis not present

## 2017-08-09 DIAGNOSIS — F5101 Primary insomnia: Secondary | ICD-10-CM

## 2017-08-09 DIAGNOSIS — E782 Mixed hyperlipidemia: Secondary | ICD-10-CM

## 2017-08-09 DIAGNOSIS — G4733 Obstructive sleep apnea (adult) (pediatric): Secondary | ICD-10-CM | POA: Diagnosis not present

## 2017-08-09 DIAGNOSIS — F411 Generalized anxiety disorder: Secondary | ICD-10-CM | POA: Diagnosis not present

## 2017-08-09 DIAGNOSIS — Z23 Encounter for immunization: Secondary | ICD-10-CM | POA: Diagnosis not present

## 2017-08-09 DIAGNOSIS — E032 Hypothyroidism due to medicaments and other exogenous substances: Secondary | ICD-10-CM

## 2017-08-09 MED ORDER — INSULIN DEGLUDEC 200 UNIT/ML ~~LOC~~ SOPN: 50.0000 [IU] | PEN_INJECTOR | Freq: Every day | SUBCUTANEOUS | 1 refills | Status: DC

## 2017-08-09 MED ORDER — INSULIN DEGLUDEC 200 UNIT/ML ~~LOC~~ SOPN: 60.0000 [IU] | PEN_INJECTOR | Freq: Every day | SUBCUTANEOUS | 1 refills | Status: DC

## 2017-08-09 MED ORDER — ZOLPIDEM TARTRATE 10 MG PO TABS: 10.0000 mg | ORAL_TABLET | Freq: Every evening | ORAL | 2 refills | Status: DC | PRN

## 2017-08-09 MED ORDER — SITAGLIPTIN PHOSPHATE 100 MG PO TABS: 100.0000 mg | ORAL_TABLET | Freq: Every day | ORAL | 1 refills | Status: DC

## 2017-08-09 MED ORDER — PRAVASTATIN SODIUM 40 MG PO TABS: ORAL_TABLET | ORAL | 1 refills | Status: DC

## 2017-08-09 MED ORDER — CLONAZEPAM 0.5 MG PO TABS: 0.5000 mg | ORAL_TABLET | Freq: Two times a day (BID) | ORAL | 2 refills | Status: DC | PRN

## 2017-08-09 MED ORDER — ESCITALOPRAM OXALATE 20 MG PO TABS: 20.0000 mg | ORAL_TABLET | Freq: Every day | ORAL | 1 refills | Status: DC

## 2017-08-09 MED ORDER — HYDROCHLOROTHIAZIDE 25 MG PO TABS: ORAL_TABLET | ORAL | 1 refills | Status: DC

## 2017-08-09 MED ORDER — ATENOLOL 100 MG PO TABS: 100.0000 mg | ORAL_TABLET | Freq: Every day | ORAL | 1 refills | Status: DC

## 2017-08-09 MED ORDER — DULAGLUTIDE 1.5 MG/0.5ML ~~LOC~~ SOAJ: 1.5000 mg | SUBCUTANEOUS | 1 refills | Status: DC

## 2017-08-09 NOTE — Addendum Note (Signed)
Addended by: Rolena Infante on: 08/09/2017 04:35 PM   Modules accepted: Orders

## 2017-08-09 NOTE — Progress Notes (Signed)
Subjective:    Patient ID: [name], female    DOB: June 19, 1955, 62 y.o.   MRN: [id number]  HPI  [name] is here today for follow up of chronic medical problem.  Outpatient Encounter Medications as of 08/09/2017  Medication Sig  . ACCU-CHEK FASTCLIX LANCETS MISC Use to chack blood sugar 2x a day and prn  Dx E11.9  . aspirin EC 81 MG tablet Take 1 tablet (81 mg total) by mouth daily.  Marland Kitchen atenolol (TENORMIN) 100 MG tablet Take 1 tablet (100 mg total) by mouth daily.  . Calcium Carbonate-Vitamin D (CALCIUM 600 + D PO) Take 1 tablet by mouth daily.   . clonazePAM (KLONOPIN) 0.5 MG tablet Take 1 tablet (0.5 mg total) by mouth 2 (two) times daily as needed.  Marland Kitchen dexlansoprazole (DEXILANT) 60 MG capsule TAKE 1 CAPSULE (60 MG TOTAL) BY MOUTH 2 (TWO) TIMES DAILY.  . Dulaglutide (TRULICITY) 1.5 VO/5.9YT SOPN Inject 1.5 mg into the skin once a week.  . escitalopram (LEXAPRO) 20 MG tablet Take 1 tablet (20 mg total) by mouth daily.  Marland Kitchen glucose blood (ACCU-CHEK GUIDE) test strip Check blood sugar 2x daily and prn  Dx. e11.9  . hydrochlorothiazide (HYDRODIURIL) 25 MG tablet TAKE 1 TABLET (25 MG TOTAL) BY MOUTH DAILY.  Marland Kitchen Insulin Degludec (TRESIBA FLEXTOUCH) 200 UNIT/ML SOPN Inject 50 Units into the skin daily.  . Insulin Pen Needle (PEN NEEDLES) 31G X 5 MM MISC 1 each by Does not apply route daily. Duffy Bruce tresiba injection  Dx E11.9  . levothyroxine (SYNTHROID, LEVOTHROID) 175 MCG tablet Take 1 tablet (175 mcg total) by mouth daily. Except Sunday.  Takes 200 mg tab on Sunday.  . levothyroxine (SYNTHROID, LEVOTHROID) 200 MCG tablet TAKE ONE TABLET BY MOUTH ON SUNDAYS ONLY  . pravastatin (PRAVACHOL) 40 MG tablet TAKE 1 TABLET (40 MG TOTAL) BY MOUTH EVERY EVENING.  . sitaGLIPtin (JANUVIA) 100 MG tablet Take 1 tablet (100 mg total) by mouth daily.  Marland Kitchen zolpidem (AMBIEN) 10 MG tablet Take 1 tablet (10 mg total) by mouth at bedtime as needed.     1. Essential hypertension  No c/o chest pain, sob  or headache. Des not check blood pressure at home. BP Readings from Last 3 Encounters:  05/10/17 (!) 144/78  02/15/17 112/64  11/30/16 117/83     2. OSA (obstructive sleep apnea)  Wears CPAP nightly  3. Gastroesophageal reflux disease without esophagitis  Takes dexilant to control symptoms  4. Type 2 diabetes mellitus with hyperglycemia, with long-term current use of insulin (HCC) Last HGAB1c was  8.3%. We increased tresiba to 50 u qhs. Fasting blood sugars ave been running over 200.  5. Hypothyroidism, unspecified type  She sees dr. Chalmers Cater for this. Looks like synthroid was increased to 262mg daily   6. Recurrent major depressive disorder, in full remission ([facility]  She is on lexapro and says it works well for her. [facility] 05[facility]10/2017 02/15/2017 11/30/2016  Decreased Interest 0 0 0  Down, Depressed, Hopeless 0 0 0  PHQ - 2 Score 0 0 0     7. GAD (generalized anxiety disorder)  On klonopin BID. She is a very anxious person and without her klonopin she cannot function  8. Hyperlipidemia, unspecified hyperlipidemia type  Not watching diet at all  9. Insomnia, unspecified type  Takes ambien to sleep- CPAP keeps her awake if she does not take something  10. Severe obesity (BMI >= 40) (HCC)  No recent weight changes  New complaints: None today  Social history: Lives with husband. Has a daughter that has had some drug problems but is doing well right now- they still monitor her medication. Daughter had a baby feb 7th and deve oped infection from c section and wound is still open.  Review of Systems  Constitutional: Negative for activity change and appetite change.  HENT: Negative.   Eyes: Negative for pain.  Respiratory: Negative for shortness of breath.   Cardiovascular: Negative for chest pain, palpitations and leg swelling.  Gastrointestinal: Negative for abdominal pain.  Endocrine: Negative for polydipsia.  Genitourinary: Negative.   Skin: Negative for  rash.  Neurological: Negative for dizziness, weakness and headaches.  Hematological: Does not bruise/bleed easily.  Psychiatric/Behavioral: Negative.   All other systems reviewed and are negative.      Objective:   Physical Exam  Constitutional: She is oriented to person, place, and time. She appears well-developed and well-nourished.  HENT:  Nose: Nose normal.  Mouth/Throat: Oropharynx is clear and moist.  Eyes: EOM are normal.  Neck: Trachea normal, normal range of motion and full passive range of motion without pain. Neck supple. No JVD present. Carotid bruit is not present. No thyromegaly present.  Cardiovascular: Normal rate, regular rhythm, normal heart sounds and intact distal pulses. Exam reveals no gallop and no friction rub.  No murmur heard. Pulmonary/Chest: Effort normal and breath sounds normal.  Abdominal: Soft. Bowel sounds are normal. She exhibits no distension and no mass. There is no tenderness.  Musculoskeletal: Normal range of motion.  Lymphadenopathy:    She has no cervical adenopathy.  Neurological: She is alert and oriented to person, place, and time. She has normal reflexes.  Skin: Skin is warm and dry.  Psychiatric: She has a normal mood and affect. Her behavior is normal. Judgment and thought content normal.    BP 126/69   Pulse 73   Temp 98.7 F (37.1 C) (Oral)   Ht '5\' 5"'  (1.651 m)   Wt 285 lb (129.3 kg)   LMP 05/24/1986 (Approximate)   BMI 47.43 kg/m   HGBA1c 8.0%      Assessment & Plan:  1. Essential hypertension Low sodium - CMP14+EGFR - atenolol (TENORMIN) 100 MG tablet; Take 1 tablet (100 mg total) by mouth daily.  Dispense: 90 tablet; Refill: 1 - hydrochlorothiazide (HYDRODIURIL) 25 MG tablet; TAKE 1 TABLET (25 MG TOTAL) BY MOUTH DAILY.  Dispense: 90 tablet; Refill: 1  2. OSA (obstructive sleep apnea) Continue to wear CPAP  3. Gastroesophageal reflux disease without esophagitis Avoid spicy foods Do not eat 2 hours prior to  bedtime  4. Type 2 diabetes mellitus with hyperglycemia, with long-term current use of insulin (HCC) Stricter carb coounting Increase trulicity to 48N for 3 days then to 60 u - Bayer DCA Hb A1c Waived - sitaGLIPtin (JANUVIA) 100 MG tablet; Take 1 tablet (100 mg total) by mouth daily.  Dispense: 90 tablet; Refill: 1 - Dulaglutide (TRULICITY) 1.5 IO/2.7OJ SOPN; Inject 1.5 mg into the skin once a week.  Dispense: 6 mL; Refill: 1 - Insulin Degludec (TRESIBA FLEXTOUCH) 200 UNIT/ML SOPN; Inject 60 Units into the skin daily.  Dispense: 27 mL; Refill: 1  5. Hypothyroidism, unspecified type Keep follow up wIth Dr. Chalmers Cater  6. Recurrent major depressive disorder, in full remission ([place]) Stress management - escitalopram (LEXAPRO) 20 MG tablet; Take 1 tablet (20 mg total) by mouth daily.  Dispense: 90 tablet; Refill: 1  7. GAD (generalized anxiety disorder) - clonazePAM (KLONOPIN) 0.5 MG tablet;  Take 1 tablet (0.5 mg total) by mouth 2 (two) times daily as needed.  Dispense: 60 tablet; Refill: 2  8. Hyperlipidemia, unspecified hyperlipidemia type Low fat diet - Lipid panel - pravastatin (PRAVACHOL) 40 MG tablet; TAKE 1 TABLET (40 MG TOTAL) BY MOUTH EVERY EVENING.  Dispense: 90 tablet; Refill: 1   9. Insomnia, unspecified type Bedtime routine- zolpidem (AMBIEN) 10 MG tablet; Take 1 tablet (10 mg total) by mouth at bedtime as needed.  Dispense: 30 tablet; Refill: 2  10. Severe obesity (BMI >= 40) (HCC) Discussed diet and exercise for person with BMI >25 Will recheck weight in 3-6 months     Labs pending Health maintenance reviewed Diet and exercise encouraged Continue all meds Follow up  In 3 months   [place], FNP

## 2017-08-09 NOTE — Telephone Encounter (Signed)
Patient canceled aex on 08/31/17 stating that she was told by her PCP that she did not need to follow up at GYN office after having a hysterectomy.

## 2017-08-09 NOTE — Addendum Note (Signed)
Addended by: Chevis Pretty on: 08/09/2017 04:10 PM   Modules accepted: Level of Service

## 2017-08-09 NOTE — Patient Instructions (Signed)
Stress and Stress Management Stress is a normal reaction to life events. It is what you feel when life demands more than you are used to or more than you can handle. Some stress can be useful. For example, the stress reaction can help you catch the last bus of the day, study for a test, or meet a deadline at work. But stress that occurs too often or for too long can cause problems. It can affect your emotional health and interfere with relationships and normal daily activities. Too much stress can weaken your immune system and increase your risk for physical illness. If you already have a medical problem, stress can make it worse. What are the causes? All sorts of life events may cause stress. An event that causes stress for one person may not be stressful for another person. Major life events commonly cause stress. These may be positive or negative. Examples include losing your job, moving into a new home, getting married, having a baby, or losing a loved one. Less obvious life events may also cause stress, especially if they occur day after day or in combination. Examples include working long hours, driving in traffic, caring for children, being in debt, or being in a difficult relationship. What are the signs or symptoms? Stress may cause emotional symptoms including, the following:  Anxiety. This is feeling worried, afraid, on edge, overwhelmed, or out of control.  Anger. This is feeling irritated or impatient.  Depression. This is feeling sad, down, helpless, or guilty.  Difficulty focusing, remembering, or making decisions.  Stress may cause physical symptoms, including the following:  Aches and pains. These may affect your head, neck, back, stomach, or other areas of your body.  Tight muscles or clenched jaw.  Low energy or trouble sleeping.  Stress may cause unhealthy behaviors, including the following:  Eating to feel better (overeating) or skipping meals.  Sleeping too little,  too much, or both.  Working too much or putting off tasks (procrastination).  Smoking, drinking alcohol, or using drugs to feel better.  How is this diagnosed? Stress is diagnosed through an assessment by your health care provider. Your health care provider will ask questions about your symptoms and any stressful life events.Your health care provider will also ask about your medical history and may order blood tests or other tests. Certain medical conditions and medicine can cause physical symptoms similar to stress. Mental illness can cause emotional symptoms and unhealthy behaviors similar to stress. Your health care provider may refer you to a mental health professional for further evaluation. How is this treated? Stress management is the recommended treatment for stress.The goals of stress management are reducing stressful life events and coping with stress in healthy ways. Techniques for reducing stressful life events include the following:  Stress identification. Self-monitor for stress and identify what causes stress for you. These skills may help you to avoid some stressful events.  Time management. Set your priorities, keep a calendar of events, and learn to say "no." These tools can help you avoid making too many commitments.  Techniques for coping with stress include the following:  Rethinking the problem. Try to think realistically about stressful events rather than ignoring them or overreacting. Try to find the positives in a stressful situation rather than focusing on the negatives.  Exercise. Physical exercise can release both physical and emotional tension. The key is to find a form of exercise you enjoy and do it regularly.  Relaxation techniques. These relax the body and  mind. Examples include yoga, meditation, tai chi, biofeedback, deep breathing, progressive muscle relaxation, listening to music, being out in nature, journaling, and other hobbies. Again, the key is to find  one or more that you enjoy and can do regularly.  Healthy lifestyle. Eat a balanced diet, get plenty of sleep, and do not smoke. Avoid using alcohol or drugs to relax.  Strong support network. Spend time with family, friends, or other people you enjoy being around.Express your feelings and talk things over with someone you trust.  Counseling or talktherapy with a mental health professional may be helpful if you are having difficulty managing stress on your own. Medicine is typically not recommended for the treatment of stress.Talk to your health care provider if you think you need medicine for symptoms of stress. Follow these instructions at home:  Keep all follow-up visits as directed by your health care provider.  Take all medicines as directed by your health care provider. Contact a health care provider if:  Your symptoms get worse or you start having new symptoms.  You feel overwhelmed by your problems and can no longer manage them on your own. Get help right away if:  You feel like hurting yourself or someone else. This information is not intended to replace advice given to you by your health care provider. Make sure you discuss any questions you have with your health care provider. Document Released: 11/03/2000 Document Revised: 10/16/2015 Document Reviewed: 01/02/2013 Elsevier Interactive Patient Education  2017 [employer] Inc[employer]  

## 2017-08-10 LAB — CMP14+EGFR
A/G RATIO: 1.8 (ref 1.2–2.2)
ALBUMIN: 3.8 g/dL (ref 3.6–4.8)
ALK PHOS: 83 IU/L (ref 39–117)
ALT: 16 IU/L (ref 0–32)
AST: 20 IU/L (ref 0–40)
BILIRUBIN TOTAL: 0.4 mg/dL (ref 0.0–1.2)
BUN / CREAT RATIO: 18 (ref 12–28)
BUN: 16 mg/dL (ref 8–27)
CHLORIDE: 98 mmol/L (ref 96–106)
CO2: 27 mmol/L (ref 20–29)
Calcium: 9.3 mg/dL (ref 8.7–10.3)
Creatinine, Ser: 0.89 mg/dL (ref 0.57–1.00)
GFR calc non Af Amer: 70 mL/min/{1.73_m2} (ref >59)
GFR, EST AFRICAN AMERICAN: 80 mL/min/{1.73_m2} (ref >59)
GLOBULIN, TOTAL: 2.1 g/dL (ref 1.5–4.5)
GLUCOSE: 136 mg/dL — AB (ref 65–99)
POTASSIUM: 3.6 mmol/L (ref 3.5–5.2)
SODIUM: 140 mmol/L (ref 134–144)
TOTAL PROTEIN: 5.9 g/dL — AB (ref 6.0–8.5)

## 2017-08-10 LAB — LIPID PANEL
CHOLESTEROL TOTAL: 179 mg/dL (ref 100–199)
Chol/HDL Ratio: 5.8 ratio — ABNORMAL HIGH (ref 0.0–4.4)
HDL: 31 mg/dL — ABNORMAL LOW (ref >39)
LDL Calculated: 99 mg/dL (ref 0–99)
Triglycerides: 244 mg/dL — ABNORMAL HIGH (ref 0–149)
VLDL CHOLESTEROL CAL: 49 mg/dL — AB (ref 5–40)

## 2017-08-10 LAB — BAYER DCA HB A1C WAIVED: HB A1C (BAYER DCA - WAIVED): 8 % — ABNORMAL HIGH (ref <7.0)

## 2017-08-10 NOTE — Telephone Encounter (Signed)
Spoke with patient. Advised yearly wellness exam still recommended after hysterectomy to include evaluation of overall GYN health -bone health, breast, pelvic exam, menopause. Patient declines to reschedule, states she will f/u with PCP. Patient verbalizes understanding, thankful for return call.   Last AEX 07/13/16 PG  Routing to provider for final review. Patient is agreeable to disposition. Will close encounter.

## 2017-08-10 NOTE — Telephone Encounter (Signed)
Left message to call Marlyn Tondreau at [phone].  

## 2017-08-30 ENCOUNTER — Other Ambulatory Visit: Payer: Self-pay | Admitting: Pediatrics

## 2017-08-30 DIAGNOSIS — F5101 Primary insomnia: Secondary | ICD-10-CM

## 2017-08-31 ENCOUNTER — Ambulatory Visit: Payer: BLUE CROSS/BLUE SHIELD | Admitting: Certified Nurse Midwife

## 2017-09-01 ENCOUNTER — Other Ambulatory Visit: Payer: Self-pay | Admitting: Nurse Practitioner

## 2017-09-01 DIAGNOSIS — F5101 Primary insomnia: Secondary | ICD-10-CM

## 2017-09-02 ENCOUNTER — Other Ambulatory Visit: Payer: Self-pay | Admitting: Pediatrics

## 2017-09-02 ENCOUNTER — Other Ambulatory Visit: Payer: Self-pay | Admitting: Nurse Practitioner

## 2017-09-02 DIAGNOSIS — F5101 Primary insomnia: Secondary | ICD-10-CM

## 2017-09-02 NOTE — Telephone Encounter (Signed)
Last seen 08/09/17  MMM

## 2017-09-02 NOTE — Telephone Encounter (Signed)
Called [facility] and advised that meds had been sent on 3/19 and it was confirmed by them. They did not have so I gave a verbal according to previous orders. Patient notified that rxs were called in.

## 2017-09-15 LAB — HM DIABETES EYE EXAM

## 2017-10-12 ENCOUNTER — Encounter: Payer: Self-pay | Admitting: Obstetrics and Gynecology

## 2017-10-12 ENCOUNTER — Ambulatory Visit: Payer: BLUE CROSS/BLUE SHIELD | Admitting: Obstetrics and Gynecology

## 2017-10-12 ENCOUNTER — Other Ambulatory Visit: Payer: Self-pay

## 2017-10-12 VITALS — BP 138/76 | HR 84 | Resp 18 | Ht 64.75 in | Wt 287.0 lb

## 2017-10-12 DIAGNOSIS — Z01419 Encounter for gynecological examination (general) (routine) without abnormal findings: Secondary | ICD-10-CM | POA: Diagnosis not present

## 2017-10-12 NOTE — Progress Notes (Signed)
62 y.o. [id number] MarriedCaucasianF here for annual exam.  H/O hysterectomy. No vaginal bleeding. No dyspareunia.   Multiple medical issues, including: diabetes, HTN, thyroid disease, sleep apnea, elevated lipids. Last HgbA1C was 8.2.     Patient's last menstrual period was 05/24/1986 (approximate).          Sexually active: Yes.    The current method of family planning is status post hysterectomy.    Exercising: Yes.    walking Smoker:  no  Health Maintenance: Pap:  06-24-14 WNL NEG HR HPV   History of abnormal Pap:  Yes, no surgery on her cervix MMG:  2/19 [facility] WNL per patient  Colonoscopy:  09-08-11 wnl, f/u 2023 BMD:   08-16-12 WNL with her primary  TDaP:  07/2017 Gardasil: N/A   reports that she has never smoked. She has never used smokeless tobacco. She reports that she does not drink alcohol or use drugs. Retired Oceanographer, school bus driver. 3 daughters, one died at 15 in an accident. Other daughters are 42 and 33, has 6 grand children  Past Medical History:  Diagnosis Date  . Abnormal Pap smear of cervix 1988   prior to hysterectomy  . Anxiety   . Arthritis 2015   spinal stenosis - Dr. Nelva Bush.  Epidural steroids  . Basal cell carcinoma of face 2008   Left  . Depression   . Diabetes mellitus without complication ([place])   . Exposure to TB    Treated x 6 months  . GERD (gastroesophageal reflux disease)   . Hiatal hernia   . Hyperlipidemia    diet controlled  . Hyperplastic colonic polyp 2003 & 2008   #3 polyps first, 1 second time  . Hypertension   . IBS (irritable bowel syndrome)   . Migraines   . OSA (obstructive sleep apnea) 07/25/2012   C- pap  . 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 Skin cancer    basal cell and 1 squamous cell on face and neck  . Thyroid disease 2011   hyperthyroid and treated with radio active iodine - now hypothyroid  . Ulcer 1982   gastric    Past Surgical History:  Procedure Laterality Date  . COLONOSCOPY    . MOHS  SURGERY  2006   left side of face  . POLYPECTOMY  colon  . THYROID SURGERY  2011   radioactive  . VAGINAL HYSTERECTOMY  1988   DUB and endometriosis    Current Outpatient Medications  Medication Sig Dispense Refill  . ACCU-CHEK FASTCLIX LANCETS MISC Use to chack blood sugar 2x a day and prn  Dx E11.9 300 each 1  . aspirin EC 81 MG tablet Take 1 tablet (81 mg total) by mouth daily.    Marland Kitchen atenolol (TENORMIN) 100 MG tablet Take 1 tablet (100 mg total) by mouth daily. 90 tablet 1  . Calcium Carbonate-Vitamin D (CALCIUM 600 + D PO) Take 1 tablet by mouth daily.     . clonazePAM (KLONOPIN) 0.5 MG tablet Take 1 tablet (0.5 mg total) by mouth 2 (two) times daily as needed. 60 tablet 2  . dexlansoprazole (DEXILANT) 60 MG capsule TAKE 1 CAPSULE (60 MG TOTAL) BY MOUTH 2 (TWO) TIMES DAILY. 180 capsule 1  . Dulaglutide (TRULICITY) 1.5 OA/4.1YS SOPN Inject 1.5 mg into the skin once a week. 6 mL 1  . escitalopram (LEXAPRO) 20 MG tablet Take 1 tablet (20 mg total) by mouth daily. 90 tablet 1  . glucose blood (  ACCU-CHEK GUIDE) test strip Check blood sugar 2x daily and prn  Dx. e11.9 300 each 1  . hydrochlorothiazide (HYDRODIURIL) 25 MG tablet TAKE 1 TABLET (25 MG TOTAL) BY MOUTH DAILY. 90 tablet 1  . Insulin Degludec (TRESIBA FLEXTOUCH) 200 UNIT/ML SOPN Inject 60 Units into the skin daily. 27 mL 1  . Insulin Pen Needle (PEN NEEDLES) 31G X 5 MM MISC 1 each by Does not apply route daily. Duffy Bruce tresiba injection  Dx E11.9 100 each 11  . levothyroxine (SYNTHROID, LEVOTHROID) 200 MCG tablet TAKE ONE TABLET BY MOUTH ON SUNDAYS ONLY 90 tablet 0  . pravastatin (PRAVACHOL) 40 MG tablet TAKE 1 TABLET (40 MG TOTAL) BY MOUTH EVERY EVENING. 90 tablet 1  . sitaGLIPtin (JANUVIA) 100 MG tablet Take 1 tablet (100 mg total) by mouth daily. 90 tablet 1  . zolpidem (AMBIEN) 10 MG tablet TAKE 1 TABLET BY MOUTH AT BEDTIME AS NEEDED 30 tablet 2   No current facility-administered medications for this visit.     Family History   Adopted: Yes  Problem Relation Age of Onset  . Lung cancer Mother   . Lung cancer Maternal Aunt   . Stroke Maternal Grandfather   . Diabetes Maternal Grandfather     Review of Systems  Constitutional: Negative.   HENT: Negative.   Eyes: Negative.   Respiratory: Negative.   Cardiovascular: Negative.   Gastrointestinal: Negative.   Endocrine: Negative.   Genitourinary: Negative.   Musculoskeletal: Negative.   Skin: Negative.   Allergic/Immunologic: Negative.   Neurological: Negative.   Psychiatric/Behavioral: Negative.     Exam:   BP 138/76 (BP Location: Right Arm, Patient Position: Sitting, Cuff Size: Normal)   Pulse 84   Resp 18   Ht 5' 4.75" (1.645 m)   Wt 287 lb (130.2 kg)   LMP 05/24/1986 (Approximate)   BMI 48.13 kg/m   Weight change: @WEIGHTCHANGE @ Height:   Height: 5' 4.75" (164.5 cm)  Ht Readings from Last 3 Encounters:  10/12/17 5' 4.75" (1.645 m)  08/09/17 5\' 5"  (1.651 m)  05/10/17 5\' 5"  (1.651 m)    General appearance: alert, cooperative and appears stated age Head: Normocephalic, without obvious abnormality, atraumatic Neck: no adenopathy, supple, symmetrical, trachea midline and thyroid normal to inspection and palpation Lungs: clear to auscultation bilaterally Cardiovascular: regular rate and rhythm Breasts: normal appearance, no masses or tenderness Abdomen: obese, soft, non-tender; non distended,  no masses,  no organomegaly Extremities: extremities normal, atraumatic, no cyanosis or edema Skin: Skin color, texture, turgor normal. No rashes or lesions Lymph nodes: Cervical, supraclavicular, and axillary nodes normal. No abnormal inguinal nodes palpated Neurologic: Grossly normal   Pelvic: External genitalia:  no lesions              Urethra:  normal appearing urethra with no masses, tenderness or lesions              Bartholins and Skenes: normal                 Vagina: normal appearing vagina with normal color and discharge, no lesions               Cervix: absent               Bimanual Exam:  Uterus:  uterus absent              Adnexa: no mass, fullness, tenderness               Rectovaginal: Confirms  Anus:  normal sphincter tone, no lesions  Chaperone was present for exam.  A:  Well Woman with normal exam  Multiple medical issues, followed by her primary MD  P:   No pap this year  Mammogram and colonoscopy are UTD  DEXA UTD with her primary  Discussed breast self exam  Discussed calcium and vit D intake  Labs with primary MD

## 2017-10-12 NOTE — Patient Instructions (Signed)

## 2017-10-25 ENCOUNTER — Other Ambulatory Visit: Payer: Self-pay | Admitting: Nurse Practitioner

## 2017-10-25 DIAGNOSIS — K219 Gastro-esophageal reflux disease without esophagitis: Secondary | ICD-10-CM

## 2017-11-08 ENCOUNTER — Encounter: Payer: Self-pay | Admitting: Pulmonary Disease

## 2017-11-08 ENCOUNTER — Ambulatory Visit: Payer: BLUE CROSS/BLUE SHIELD | Admitting: Pulmonary Disease

## 2017-11-08 VITALS — BP 112/72 | HR 82 | Ht 65.5 in | Wt 287.0 lb

## 2017-11-08 DIAGNOSIS — Z6841 Body Mass Index (BMI) 40.0 and over, adult: Secondary | ICD-10-CM

## 2017-11-08 DIAGNOSIS — Z9989 Dependence on other enabling machines and devices: Secondary | ICD-10-CM | POA: Diagnosis not present

## 2017-11-08 DIAGNOSIS — G4733 Obstructive sleep apnea (adult) (pediatric): Secondary | ICD-10-CM

## 2017-11-08 NOTE — Progress Notes (Signed)
[facility]  Chief Complaint  Patient presents with  . Follow-up    Pt is needing a new cpap machine, pt's machine is over 16yrs old.    Vital signs: BP 112/72 (BP Location: Left Arm, Cuff Size: Normal)   Pulse 82   Ht 5' 5.5" (1.664 m)   Wt 287 lb (130.2 kg)   LMP 05/24/1986 (Approximate)   SpO2 94%   BMI 47.03 kg/m   History of Present Illness: [name] is a 62 y.o. female with obstructive sleep apnea.  She uses CPAP nightly.  No issues with mask fit.  Sleeps well and feels rested.  Her machine is more than 63 yrs old.  CPAP download 08/10/17 to 11/07/17 >> used on 90 of 90 nights with average 9 hrs 3 min per night.  Physical Exam:  General - pleasant Eyes - pupils reactive ENT - no sinus tenderness, no oral exudate, no LAN, MP 4 Cardiac - regular, no murmur Chest - no wheeze, rales Abd - soft, non tender Ext - no edema Skin - no rashes Neuro - normal strength Psych - normal mood   Assessment/Plan:  Obstructive sleep apnea.  - she is compliant with CPAP and reports benefit -  Will arrange for new CPAP machine since her device is more than 63 yrs old - continue CPAP 18 cm H2O  Obesity. - encouraged her to keep up with weight loss efforts   Patient Instructions  Will arrange for new CPAP machine and follow up in 2 months    Chesley Mires, MD [place] 11/08/2017, 12:31 PM  Flow Sheet  Sleep tests: PSG 08/03/12>>AHI 108.6, SpO2 low 83%; CPAP 11 cm H2O >>AHI 4.3, +R, +S, PLMI 6 >>3.5. CPAP 10/11/16 to 11/09/16 >> used on 30 of 30 nights with average 8 hrs 57 min.  CPAP 18 cm H2O.  Pulmonary tests Barium esophogram 07/05/12 >> Prominent, spontaneous GERD reaching upper thoracic esophagus CXR 07/19/12>>small hiatal hernia, otherwise normal PFT 08/01/12>>FEV1 2.50 (87%), FEV1% 85, TLC 5.04 (92%), DLCO 76%  Past Medical History: She  has a past medical history of Abnormal Pap smear of  cervix (1988), Anxiety, Arthritis (2015), Basal cell carcinoma of face (2008), Depression, Diabetes mellitus without complication ([place]), Exposure to TB, GERD (gastroesophageal reflux disease), Hiatal hernia, Hyperlipidemia, Hyperplastic colonic polyp (2003 & 2008), Hypertension, IBS (irritable bowel syndrome), Migraines, OSA (obstructive sleep apnea) (07/25/2012), PPD positive, treated (1995), Skin cancer, Thyroid disease (2011), and Ulcer (1982).  Past Surgical History: She  has a past surgical history that includes Mohs surgery (2006); Polypectomy (colon); Colonoscopy; Thyroid surgery (2011); and Vaginal hysterectomy (1988).  Family History: Her family history includes Diabetes in her maternal grandfather; Lung cancer in her maternal aunt and mother; Stroke in her maternal grandfather. She was adopted.  Social History: She  reports that she has never smoked. She has never used smokeless tobacco. She reports that she does not drink alcohol or use drugs.  Medications: Allergies as of 11/08/2017      Reactions   Metformin And Related Diarrhea   severe diarrhea   Lipitor [atorvastatin Calcium]    myalgia   Statins    Myalgia   Zocor [simvastatin - High Dose]    myalgia      Medication List        Accurate as of 11/08/17 12:31 PM. Always use your most recent med list.          ACCU-CHEK FASTCLIX LANCETS Misc Use to  chack blood sugar 2x a day and prn  Dx E11.9   aspirin EC 81 MG tablet Take 1 tablet (81 mg total) by mouth daily.   atenolol 100 MG tablet Commonly known as:  TENORMIN Take 1 tablet (100 mg total) by mouth daily.   CALCIUM 600 + D PO Take 1 tablet by mouth daily.   clonazePAM 0.5 MG tablet Commonly known as:  KLONOPIN Take 1 tablet (0.5 mg total) by mouth 2 (two) times daily as needed.   dexlansoprazole 60 MG capsule Commonly known as:  DEXILANT TAKE 1 CAPSULE (60 MG TOTAL) BY MOUTH 2 (TWO) TIMES DAILY.   Dulaglutide 1.5 MG/0.5ML Sopn Commonly known as:   TRULICITY Inject 1.5 mg into the skin once a week.   escitalopram 20 MG tablet Commonly known as:  LEXAPRO Take 1 tablet (20 mg total) by mouth daily.   glucose blood test strip Commonly known as:  ACCU-CHEK GUIDE Check blood sugar 2x daily and prn  Dx. e11.9   hydrochlorothiazide 25 MG tablet Commonly known as:  HYDRODIURIL TAKE 1 TABLET (25 MG TOTAL) BY MOUTH DAILY.   Insulin Degludec 200 UNIT/ML Sopn Commonly known as:  TRESIBA FLEXTOUCH Inject 60 Units into the skin daily.   levothyroxine 200 MCG tablet Commonly known as:  SYNTHROID, LEVOTHROID TAKE ONE TABLET BY MOUTH ON SUNDAYS ONLY   Pen Needles 31G X 5 MM Misc 1 each by Does not apply route daily. Duffy Bruce tresiba injection  Dx E11.9   pravastatin 40 MG tablet Commonly known as:  PRAVACHOL TAKE 1 TABLET (40 MG TOTAL) BY MOUTH EVERY EVENING.   sitaGLIPtin 100 MG tablet Commonly known as:  JANUVIA Take 1 tablet (100 mg total) by mouth daily.   zolpidem 10 MG tablet Commonly known as:  AMBIEN TAKE 1 TABLET BY MOUTH AT BEDTIME AS NEEDED

## 2017-11-08 NOTE — Patient Instructions (Signed)
Will arrange for new CPAP machine and follow up in 2 months 

## 2017-11-10 ENCOUNTER — Encounter: Payer: Self-pay | Admitting: Nurse Practitioner

## 2017-11-10 ENCOUNTER — Ambulatory Visit: Payer: BLUE CROSS/BLUE SHIELD | Admitting: Nurse Practitioner

## 2017-11-10 VITALS — BP 142/88 | HR 54 | Temp 97.7°F | Ht 65.0 in | Wt 287.0 lb

## 2017-11-10 DIAGNOSIS — F411 Generalized anxiety disorder: Secondary | ICD-10-CM | POA: Diagnosis not present

## 2017-11-10 DIAGNOSIS — I10 Essential (primary) hypertension: Secondary | ICD-10-CM | POA: Diagnosis not present

## 2017-11-10 DIAGNOSIS — E039 Hypothyroidism, unspecified: Secondary | ICD-10-CM | POA: Diagnosis not present

## 2017-11-10 DIAGNOSIS — E1165 Type 2 diabetes mellitus with hyperglycemia: Secondary | ICD-10-CM

## 2017-11-10 DIAGNOSIS — G4733 Obstructive sleep apnea (adult) (pediatric): Secondary | ICD-10-CM

## 2017-11-10 DIAGNOSIS — K219 Gastro-esophageal reflux disease without esophagitis: Secondary | ICD-10-CM | POA: Diagnosis not present

## 2017-11-10 DIAGNOSIS — F5101 Primary insomnia: Secondary | ICD-10-CM

## 2017-11-10 DIAGNOSIS — F3342 Major depressive disorder, recurrent, in full remission: Secondary | ICD-10-CM

## 2017-11-10 DIAGNOSIS — Z794 Long term (current) use of insulin: Secondary | ICD-10-CM | POA: Diagnosis not present

## 2017-11-10 DIAGNOSIS — E785 Hyperlipidemia, unspecified: Secondary | ICD-10-CM

## 2017-11-10 DIAGNOSIS — E782 Mixed hyperlipidemia: Secondary | ICD-10-CM | POA: Diagnosis not present

## 2017-11-10 LAB — BAYER DCA HB A1C WAIVED: HB A1C: 7.8 % — AB (ref <7.0)

## 2017-11-10 MED ORDER — ZOLPIDEM TARTRATE 10 MG PO TABS: 10.0000 mg | ORAL_TABLET | Freq: Every evening | ORAL | 2 refills | Status: DC | PRN

## 2017-11-10 MED ORDER — INSULIN DEGLUDEC 200 UNIT/ML ~~LOC~~ SOPN: 60.0000 [IU] | PEN_INJECTOR | Freq: Every day | SUBCUTANEOUS | 1 refills | Status: DC

## 2017-11-10 MED ORDER — SULFAMETHOXAZOLE-TRIMETHOPRIM 800-160 MG PO TABS: 1.0000 | ORAL_TABLET | Freq: Two times a day (BID) | ORAL | 0 refills | Status: DC

## 2017-11-10 MED ORDER — ATENOLOL 100 MG PO TABS: 100.0000 mg | ORAL_TABLET | Freq: Every day | ORAL | 1 refills | Status: DC

## 2017-11-10 MED ORDER — DULAGLUTIDE 1.5 MG/0.5ML ~~LOC~~ SOAJ: 1.5000 mg | SUBCUTANEOUS | 1 refills | Status: DC

## 2017-11-10 MED ORDER — PRAVASTATIN SODIUM 40 MG PO TABS
ORAL_TABLET | ORAL | 1 refills | Status: DC
Start: 2017-11-10 — End: 2018-02-14

## 2017-11-10 MED ORDER — SITAGLIPTIN PHOSPHATE 100 MG PO TABS: 100.0000 mg | ORAL_TABLET | Freq: Every day | ORAL | 1 refills | Status: DC

## 2017-11-10 MED ORDER — LEVOTHYROXINE SODIUM 200 MCG PO TABS: 200.0000 ug | ORAL_TABLET | Freq: Every day | ORAL | 1 refills | Status: DC

## 2017-11-10 MED ORDER — ESCITALOPRAM OXALATE 20 MG PO TABS: 20.0000 mg | ORAL_TABLET | Freq: Every day | ORAL | 1 refills | Status: DC

## 2017-11-10 MED ORDER — DEXLANSOPRAZOLE 60 MG PO CPDR: DELAYED_RELEASE_CAPSULE | ORAL | 0 refills | Status: DC

## 2017-11-10 MED ORDER — CLONAZEPAM 0.5 MG PO TABS: 0.5000 mg | ORAL_TABLET | Freq: Two times a day (BID) | ORAL | 2 refills | Status: DC | PRN

## 2017-11-10 MED ORDER — HYDROCHLOROTHIAZIDE 25 MG PO TABS: ORAL_TABLET | ORAL | 1 refills | Status: DC

## 2017-11-10 NOTE — Progress Notes (Signed)
Subjective:    Patient ID: [name], female    DOB: Dec 27, 1955, 62 y.o.   MRN: [id number]   Chief Complaint: Medical management of chroinic issues  HPI:  1. Essential hypertension  No c/o chest pain, sob or headache. Does not check blood pressures at home. BP Readings from Last 3 Encounters:  11/08/17 112/72  10/12/17 138/76  08/09/17 126/69     2. OSA (obstructive sleep apnea)  Wears CPAP nightly. Feels rested in mornings  3. Gastroesophageal reflux disease without esophagitis  Takes dexilant daily- works well to keep symptoms under control  4. Hypothyroidism, unspecified type  No problems that she is aware  5. Type 2 diabetes mellitus with hyperglycemia, with long-term current use of insulin (HCC)  HGBA1C was 8.0. Patient does not check blood sugars every day. Average has been 220. No longer is seeing endocrinilogist.  6. Severe obesity (BMI >= 40) (HCC)  No recent weight changes  7. Insomnia, unspecified type  Takes ambien at night and is unable to sleep without it.  8. GAD (generalized anxiety disorder)  Takes klonopin BID and gets very anxious if does not take.  9. Hyperlipidemia, unspecified hyperlipidemia type  Does not watch diet  10. Recurrent major depressive disorder, in full remission ([place])  Takes lexapro daily. Works well for depression.    Outpatient Encounter Medications as of 11/10/2017  Medication Sig  . ACCU-CHEK FASTCLIX LANCETS MISC Use to chack blood sugar 2x a day and prn  Dx E11.9  . aspirin EC 81 MG tablet Take 1 tablet (81 mg total) by mouth daily.  Marland Kitchen atenolol (TENORMIN) 100 MG tablet Take 1 tablet (100 mg total) by mouth daily.  . Calcium Carbonate-Vitamin D (CALCIUM 600 + D PO) Take 1 tablet by mouth daily.   . clonazePAM (KLONOPIN) 0.5 MG tablet Take 1 tablet (0.5 mg total) by mouth 2 (two) times daily as needed.  Marland Kitchen dexlansoprazole (DEXILANT) 60 MG capsule TAKE 1 CAPSULE (60 MG TOTAL) BY MOUTH 2 (TWO) TIMES DAILY.  . Dulaglutide  (TRULICITY) 1.5 SE/8.3TD SOPN Inject 1.5 mg into the skin once a week.  . escitalopram (LEXAPRO) 20 MG tablet Take 1 tablet (20 mg total) by mouth daily.  Marland Kitchen glucose blood (ACCU-CHEK GUIDE) test strip Check blood sugar 2x daily and prn  Dx. e11.9  . hydrochlorothiazide (HYDRODIURIL) 25 MG tablet TAKE 1 TABLET (25 MG TOTAL) BY MOUTH DAILY.  Marland Kitchen Insulin Degludec (TRESIBA FLEXTOUCH) 200 UNIT/ML SOPN Inject 60 Units into the skin daily.  . Insulin Pen Needle (PEN NEEDLES) 31G X 5 MM MISC 1 each by Does not apply route daily. [name] tresiba injection  Dx E11.9  . levothyroxine (SYNTHROID, LEVOTHROID) 200 MCG tablet TAKE ONE TABLET BY MOUTH ON SUNDAYS ONLY  . pravastatin (PRAVACHOL) 40 MG tablet TAKE 1 TABLET (40 MG TOTAL) BY MOUTH EVERY EVENING.  . sitaGLIPtin (JANUVIA) 100 MG tablet Take 1 tablet (100 mg total) by mouth daily.  Marland Kitchen zolpidem (AMBIEN) 10 MG tablet TAKE 1 TABLET BY MOUTH AT BEDTIME AS NEEDED       New complaints: None today  Social history: Lives with her husband. Helps take care of her daughter with regards to medication.  Review of Systems  Constitutional: Negative for activity change and appetite change.  HENT: Negative.   Eyes: Negative for pain.  Respiratory: Negative for shortness of breath.   Cardiovascular: Negative for chest pain, palpitations and leg swelling.  Gastrointestinal: Negative for abdominal pain.  Endocrine: Negative for polydipsia.  Genitourinary:  Negative.   Skin: Negative for rash.  Neurological: Negative for dizziness, weakness and headaches.  Hematological: Does not bruise/bleed easily.  Psychiatric/Behavioral: Negative.   All other systems reviewed and are negative.      Objective:   Physical Exam  Constitutional: She is oriented to person, place, and time. She appears well-developed and well-nourished. No distress.  HENT:  Head: Normocephalic.  Nose: Nose normal.  Mouth/Throat: Oropharynx is clear and moist.  Eyes: Pupils are equal, round,  and reactive to light. EOM are normal.  Neck: Normal range of motion. Neck supple. No JVD present. Carotid bruit is not present.  Cardiovascular: Normal rate, regular rhythm, normal heart sounds and intact distal pulses.  Pulmonary/Chest: Effort normal and breath sounds normal. No respiratory distress. She has no wheezes. She has no rales. She exhibits no tenderness.  Abdominal: Soft. Normal appearance, normal aorta and bowel sounds are normal. She exhibits no distension, no abdominal bruit, no pulsatile midline mass and no mass. There is no splenomegaly or hepatomegaly. There is no tenderness.  Musculoskeletal: Normal range of motion. She exhibits no edema.  Lymphadenopathy:    She has no cervical adenopathy.  Neurological: She is alert and oriented to person, place, and time. She has normal reflexes.  Skin: Skin is warm and dry.  Psychiatric: She has a normal mood and affect. Her behavior is normal. Judgment and thought content normal.   BP (!) 142/88   Pulse (!) 54   Temp 97.7 F (36.5 C) (Oral)   Ht 5' 5" (1.651 m)   Wt 287 lb (130.2 kg)   LMP 05/24/1986 (Approximate)   BMI 47.76 kg/m   hgba1c 7.8     Assessment & Plan:  [name] comes in today with chief complaint of Medical Management of Chronic Issues   Diagnosis and orders addressed:  1. Essential hypertension Low sodium diet - CMP14+EGFR - atenolol (TENORMIN) 100 MG tablet; Take 1 tablet (100 mg total) by mouth daily.  Dispense: 90 tablet; Refill: 1 - hydrochlorothiazide (HYDRODIURIL) 25 MG tablet; TAKE 1 TABLET (25 MG TOTAL) BY MOUTH DAILY.  Dispense: 90 tablet; Refill: 1  2. OSA (obstructive sleep apnea) Continue to wear CPAP  3. Gastroesophageal reflux disease without esophagitis Avoid spicy foods Do not eat 2 hours prior to bedtime - dexlansoprazole (DEXILANT) 60 MG capsule; TAKE 1 CAPSULE (60 MG TOTAL) BY MOUTH 2 (TWO) TIMES DAILY.  Dispense: 180 capsule; Refill: 0  4. Hypothyroidism, unspecified  type - levothyroxine (SYNTHROID, LEVOTHROID) 200 MCG tablet; Take 1 tablet (200 mcg total) by mouth daily before breakfast.  Dispense: 90 tablet; Refill: 1  5. Type 2 diabetes mellitus with hyperglycemia, with long-term current use of insulin (HCC) Continue to watch carbs in diet - Bayer DCA Hb A1c Waived - Insulin Degludec (TRESIBA FLEXTOUCH) 200 UNIT/ML SOPN; Inject 60 Units into the skin daily.  Dispense: 27 mL; Refill: 1 - sitaGLIPtin (JANUVIA) 100 MG tablet; Take 1 tablet (100 mg total) by mouth daily.  Dispense: 90 tablet; Refill: 1 - Dulaglutide (TRULICITY) 1.5 DJ/2.4QA SOPN; Inject 1.5 mg into the skin once a week.  Dispense: 6 mL; Refill: 1  6. Severe obesity (BMI >= 40) (HCC) Discussed diet and exercise for person with BMI >25 Will recheck weight in 3-6 months  7. GAD (generalized anxiety disorder) Stress management - clonazePAM (KLONOPIN) 0.5 MG tablet; Take 1 tablet (0.5 mg total) by mouth 2 (two) times daily as needed.  Dispense: 60 tablet; Refill: 2  8. Recurrent major depressive disorder, in  full remission (HCC) - escitalopram (LEXAPRO) 20 MG tablet; Take 1 tablet (20 mg total) by mouth daily.9 Dispense: 90 tablet; Refill: 1  9. Mixed hyperlipidemia Low fat diet - Lipid panel - pravastatin (PRAVACHOL) 40 MG tablet; TAKE 1 TABLET (40 MG TOTAL) BY MOUTH EVERY EVENING.  Dispense: 90 tablet; Refill: 1  11. Primary insomnia Bedtime routine - zolpidem (AMBIEN) 10 MG tablet; Take 1 tablet (10 mg total) by mouth at bedtime as needed.  Dispense: 30 tablet; Refill: 2   Labs pending Health Maintenance reviewed Diet and exercise encouraged  Follow up plan: 3 months   Mary-Margaret Hassell Done, FNP

## 2017-11-10 NOTE — Patient Instructions (Signed)

## 2017-11-10 NOTE — Addendum Note (Signed)
Addended by: Chevis Pretty on: 11/10/2017 03:57 PM   Modules accepted: Orders

## 2017-11-11 LAB — CMP14+EGFR
ALK PHOS: 78 IU/L (ref 39–117)
ALT: 16 IU/L (ref 0–32)
AST: 22 IU/L (ref 0–40)
Albumin/Globulin Ratio: 2 (ref 1.2–2.2)
Albumin: 4 g/dL (ref 3.6–4.8)
BUN/Creatinine Ratio: 17 (ref 12–28)
BUN: 13 mg/dL (ref 8–27)
Bilirubin Total: 0.3 mg/dL (ref 0.0–1.2)
CALCIUM: 9.5 mg/dL (ref 8.7–10.3)
CO2: 27 mmol/L (ref 20–29)
CREATININE: 0.77 mg/dL (ref 0.57–1.00)
Chloride: 101 mmol/L (ref 96–106)
GFR calc Af Amer: 96 mL/min/{1.73_m2} (ref >59)
GFR, EST NON AFRICAN AMERICAN: 83 mL/min/{1.73_m2} (ref >59)
GLOBULIN, TOTAL: 2 g/dL (ref 1.5–4.5)
GLUCOSE: 146 mg/dL — AB (ref 65–99)
Potassium: 3.7 mmol/L (ref 3.5–5.2)
Sodium: 139 mmol/L (ref 134–144)
Total Protein: 6 g/dL (ref 6.0–8.5)

## 2017-11-11 LAB — LIPID PANEL
CHOL/HDL RATIO: 5.3 ratio — AB (ref 0.0–4.4)
CHOLESTEROL TOTAL: 181 mg/dL (ref 100–199)
HDL: 34 mg/dL — ABNORMAL LOW (ref >39)
LDL CALC: 101 mg/dL — AB (ref 0–99)
TRIGLYCERIDES: 229 mg/dL — AB (ref 0–149)
VLDL Cholesterol Cal: 46 mg/dL — ABNORMAL HIGH (ref 5–40)

## 2017-11-29 ENCOUNTER — Other Ambulatory Visit: Payer: Self-pay | Admitting: Nurse Practitioner

## 2017-11-29 DIAGNOSIS — E1165 Type 2 diabetes mellitus with hyperglycemia: Secondary | ICD-10-CM

## 2017-11-29 DIAGNOSIS — E032 Hypothyroidism due to medicaments and other exogenous substances: Secondary | ICD-10-CM

## 2017-11-29 DIAGNOSIS — Z794 Long term (current) use of insulin: Principal | ICD-10-CM

## 2017-11-30 ENCOUNTER — Other Ambulatory Visit: Payer: Self-pay | Admitting: Nurse Practitioner

## 2017-11-30 DIAGNOSIS — F411 Generalized anxiety disorder: Secondary | ICD-10-CM

## 2017-12-09 ENCOUNTER — Other Ambulatory Visit: Payer: Self-pay | Admitting: Nurse Practitioner

## 2017-12-09 DIAGNOSIS — Z794 Long term (current) use of insulin: Principal | ICD-10-CM

## 2017-12-09 DIAGNOSIS — E1165 Type 2 diabetes mellitus with hyperglycemia: Secondary | ICD-10-CM

## 2018-01-30 ENCOUNTER — Other Ambulatory Visit: Payer: Self-pay | Admitting: Nurse Practitioner

## 2018-01-30 DIAGNOSIS — F5101 Primary insomnia: Secondary | ICD-10-CM

## 2018-01-30 NOTE — Telephone Encounter (Signed)
Last office visit 11/10/2017

## 2018-02-07 ENCOUNTER — Ambulatory Visit: Payer: BLUE CROSS/BLUE SHIELD | Admitting: Pulmonary Disease

## 2018-02-10 ENCOUNTER — Other Ambulatory Visit: Payer: Self-pay | Admitting: Nurse Practitioner

## 2018-02-10 DIAGNOSIS — E1165 Type 2 diabetes mellitus with hyperglycemia: Secondary | ICD-10-CM

## 2018-02-10 DIAGNOSIS — Z794 Long term (current) use of insulin: Principal | ICD-10-CM

## 2018-02-14 ENCOUNTER — Ambulatory Visit: Payer: BLUE CROSS/BLUE SHIELD | Admitting: Nurse Practitioner

## 2018-02-14 ENCOUNTER — Encounter: Payer: Self-pay | Admitting: Nurse Practitioner

## 2018-02-14 VITALS — BP 131/68 | HR 79 | Temp 97.7°F | Ht 65.0 in | Wt 283.6 lb

## 2018-02-14 DIAGNOSIS — G4733 Obstructive sleep apnea (adult) (pediatric): Secondary | ICD-10-CM | POA: Diagnosis not present

## 2018-02-14 DIAGNOSIS — E039 Hypothyroidism, unspecified: Secondary | ICD-10-CM

## 2018-02-14 DIAGNOSIS — I10 Essential (primary) hypertension: Secondary | ICD-10-CM | POA: Diagnosis not present

## 2018-02-14 DIAGNOSIS — Z794 Long term (current) use of insulin: Secondary | ICD-10-CM

## 2018-02-14 DIAGNOSIS — K219 Gastro-esophageal reflux disease without esophagitis: Secondary | ICD-10-CM

## 2018-02-14 DIAGNOSIS — E785 Hyperlipidemia, unspecified: Secondary | ICD-10-CM

## 2018-02-14 DIAGNOSIS — F3342 Major depressive disorder, recurrent, in full remission: Secondary | ICD-10-CM

## 2018-02-14 DIAGNOSIS — E782 Mixed hyperlipidemia: Secondary | ICD-10-CM

## 2018-02-14 DIAGNOSIS — E1165 Type 2 diabetes mellitus with hyperglycemia: Secondary | ICD-10-CM

## 2018-02-14 DIAGNOSIS — E032 Hypothyroidism due to medicaments and other exogenous substances: Secondary | ICD-10-CM

## 2018-02-14 DIAGNOSIS — F5101 Primary insomnia: Secondary | ICD-10-CM

## 2018-02-14 DIAGNOSIS — F411 Generalized anxiety disorder: Secondary | ICD-10-CM

## 2018-02-14 LAB — BAYER DCA HB A1C WAIVED: HB A1C: 8.2 % — AB (ref <7.0)

## 2018-02-14 MED ORDER — LEVOTHYROXINE SODIUM 200 MCG PO TABS: 200.0000 ug | ORAL_TABLET | Freq: Every day | ORAL | 3 refills | Status: DC

## 2018-02-14 MED ORDER — ZOLPIDEM TARTRATE 10 MG PO TABS: ORAL_TABLET | ORAL | 2 refills | Status: DC

## 2018-02-14 MED ORDER — DEXLANSOPRAZOLE 60 MG PO CPDR: DELAYED_RELEASE_CAPSULE | ORAL | 1 refills | Status: DC

## 2018-02-14 MED ORDER — ESCITALOPRAM OXALATE 20 MG PO TABS: 20.0000 mg | ORAL_TABLET | Freq: Every day | ORAL | 1 refills | Status: DC

## 2018-02-14 MED ORDER — PRAVASTATIN SODIUM 40 MG PO TABS: ORAL_TABLET | ORAL | 1 refills | Status: DC

## 2018-02-14 MED ORDER — LEVOTHYROXINE SODIUM 175 MCG PO TABS: ORAL_TABLET | ORAL | 1 refills | Status: DC

## 2018-02-14 MED ORDER — CLONAZEPAM 0.5 MG PO TABS: 0.5000 mg | ORAL_TABLET | Freq: Two times a day (BID) | ORAL | 2 refills | Status: DC | PRN

## 2018-02-14 MED ORDER — HYDROCHLOROTHIAZIDE 25 MG PO TABS: ORAL_TABLET | ORAL | 1 refills | Status: DC

## 2018-02-14 MED ORDER — INSULIN DEGLUDEC 200 UNIT/ML ~~LOC~~ SOPN: 65.0000 [IU] | PEN_INJECTOR | Freq: Every day | SUBCUTANEOUS | 1 refills | Status: DC

## 2018-02-14 MED ORDER — DULAGLUTIDE 1.5 MG/0.5ML ~~LOC~~ SOAJ: 1.5000 mg | SUBCUTANEOUS | 1 refills | Status: DC

## 2018-02-14 MED ORDER — ATENOLOL 100 MG PO TABS: 100.0000 mg | ORAL_TABLET | Freq: Every day | ORAL | 1 refills | Status: DC

## 2018-02-14 MED ORDER — SITAGLIPTIN PHOSPHATE 100 MG PO TABS: 100.0000 mg | ORAL_TABLET | Freq: Every day | ORAL | 1 refills | Status: DC

## 2018-02-14 NOTE — Patient Instructions (Signed)

## 2018-02-14 NOTE — Progress Notes (Signed)
Subjective:    Patient ID: [name], female    DOB: 1956-02-23, 63 y.o.   MRN: [id number]   Chief Complaint: Medical management of chronic issues  HPI:  1. Essential hypertension  No c/o chest pain, sob or headache. Does not check blood pressure at home. BP Readings from Last 3 Encounters:  11/10/17 (!) 142/88  11/08/17 112/72  10/12/17 138/76     2. OSA (obstructive sleep apnea)  Wears CPAP nightly. Feels rested in mornings  3. Gastroesophageal reflux disease without esophagitis  Is on dexilant daily which works well to keep symptoms under control  4. Hypothyroidism, unspecified type  sees dr. Merlene Laughter every 6 months. Has no recent changes to medications  5. Type 2 diabetes mellitus with hyperglycemia, with long-term current use of insulin (HCC)  ;last hgba1c was 7.8%. No changes were made to meds at that time. Blood sugars have been running around 170-190. Was 148 this morning. No hypoglycemia.  6. Hyperlipidemia, unspecified hyperlipidemia type  Does not watch diet and does no exercise.  7. GAD (generalized anxiety disorder)  Stays stressed and worries a lot about her family. Takes klonopin daily  8. Recurrent major depressive disorder, in full remission ([place])  She is on lexapro. She says she does not feel that sheis depressed at this time.  9. Insomnia, unspecified type  Takes ambien to sleep at night. Is not abloe to sleep without it because when she does not take the CPAP machine keeps her awake.  10. Severe obesity (BMI >= 40) (HCC)  No recent weight changes    Outpatient Encounter Medications as of 02/14/2018  Medication Sig  . ACCU-CHEK FASTCLIX LANCETS MISC Use to chack blood sugar 2x a day and prn  Dx E11.9  . ACCU-CHEK GUIDE test strip CHECK BLOOD SUGAR TWICE DAILY AND AS NEEDED  . aspirin EC 81 MG tablet Take 1 tablet (81 mg total) by mouth daily.  Marland Kitchen atenolol (TENORMIN) 100 MG tablet Take 1 tablet (100 mg total) by mouth daily.  . B-D UF III MINI PEN  NEEDLES 31G X 5 MM MISC USE AS DIRECTED WITH TRESIBA E11.9  . Calcium Carbonate-Vitamin D (CALCIUM 600 + D PO) Take 1 tablet by mouth daily.   . clonazePAM (KLONOPIN) 0.5 MG tablet Take 1 tablet (0.5 mg total) by mouth 2 (two) times daily as needed.  Marland Kitchen dexlansoprazole (DEXILANT) 60 MG capsule TAKE 1 CAPSULE (60 MG TOTAL) BY MOUTH 2 (TWO) TIMES DAILY.  . Dulaglutide (TRULICITY) 1.5 CB/6.3AG SOPN Inject 1.5 mg into the skin once a week.  . escitalopram (LEXAPRO) 20 MG tablet Take 1 tablet (20 mg total) by mouth daily.  . hydrochlorothiazide (HYDRODIURIL) 25 MG tablet TAKE 1 TABLET (25 MG TOTAL) BY MOUTH DAILY.  Marland Kitchen Insulin Degludec (TRESIBA FLEXTOUCH) 200 UNIT/ML SOPN Inject 60 Units into the skin daily.  Marland Kitchen levothyroxine (SYNTHROID, LEVOTHROID) 175 MCG tablet TAKE 1 TABLET (175 MCG TOTAL) BY MOUTH DAILY. EXCEPT SUNDAY. TAKES 200 MCG TAB ON SUNDAY.  Marland Kitchen levothyroxine (SYNTHROID, LEVOTHROID) 200 MCG tablet Take 1 tablet (200 mcg total) by mouth daily before breakfast.  . pravastatin (PRAVACHOL) 40 MG tablet TAKE 1 TABLET (40 MG TOTAL) BY MOUTH EVERY EVENING.  . sitaGLIPtin (JANUVIA) 100 MG tablet Take 1 tablet (100 mg total) by mouth daily.  Marland Kitchen sulfamethoxazole-trimethoprim (BACTRIM DS) 800-160 MG tablet Take 1 tablet by mouth 2 (two) times daily.  Marland Kitchen zolpidem (AMBIEN) 10 MG tablet Take 1 tablet (10 mg total) by mouth at bedtime as needed.  New complaints: None today  Social history: Lives with her husband. Has a daughter that she has to check on often because of past drug addiction.  This is the anniversary of the death of her daughter in a 4 wheeler accident.   Review of Systems  Constitutional: Negative for activity change and appetite change.  HENT: Negative.   Eyes: Negative for pain.  Respiratory: Negative for shortness of breath.   Cardiovascular: Negative for chest pain, palpitations and leg swelling.  Gastrointestinal: Negative for abdominal pain.  Endocrine: Negative for polydipsia.    Genitourinary: Negative.   Skin: Negative for rash.  Neurological: Negative for dizziness, weakness and headaches.  Hematological: Does not bruise/bleed easily.  Psychiatric/Behavioral: Negative.   All other systems reviewed and are negative.      Objective:   Physical Exam  Constitutional: She is oriented to person, place, and time. She appears well-developed and well-nourished. No distress.  HENT:  Head: Normocephalic.  Nose: Nose normal.  Mouth/Throat: Oropharynx is clear and moist.  Eyes: Pupils are equal, round, and reactive to light. EOM are normal.  Neck: Normal range of motion. Neck supple. No JVD present. Carotid bruit is not present.  Cardiovascular: Normal rate, regular rhythm, normal heart sounds and intact distal pulses.  Pulmonary/Chest: Effort normal and breath sounds normal. No respiratory distress. She has no wheezes. She has no rales. She exhibits no tenderness.  Abdominal: Soft. Normal appearance, normal aorta and bowel sounds are normal. She exhibits no distension, no abdominal bruit, no pulsatile midline mass and no mass. There is no splenomegaly or hepatomegaly. There is no tenderness.  Musculoskeletal: Normal range of motion. She exhibits no edema.  Lymphadenopathy:    She has no cervical adenopathy.  Neurological: She is alert and oriented to person, place, and time. She has normal reflexes.  Skin: Skin is warm and dry.  Psychiatric: She has a normal mood and affect. Her behavior is normal. Judgment and thought content normal.  Nursing note and vitals reviewed.   BP 131/68   Pulse 79   Temp 97.7 F (36.5 C) (Oral)   Ht '5\' 5"'$  (1.651 m)   Wt 283 lb 9.6 oz (128.6 kg)   LMP 05/24/1986 (Approximate)   BMI 47.19 kg/m   hgba1c 8.2%      Assessment & Plan:  [name] comes in today with chief complaint of Diabetes (3 month follow up); Hypertension; Hyperlipidemia; and Diarrhea (Patient states that she had it this morning but has went  away.)   Diagnosis and orders addressed:  1. Essential hypertension Low sodium diet - CMP14+EGFR - atenolol (TENORMIN) 100 MG tablet; Take 1 tablet (100 mg total) by mouth daily.  Dispense: 90 tablet; Refill: 1 - hydrochlorothiazide (HYDRODIURIL) 25 MG tablet; TAKE 1 TABLET (25 MG TOTAL) BY MOUTH DAILY.  Dispense: 90 tablet; Refill: 1  2. OSA (obstructive sleep apnea) Continue to wear CPAP at night  3. Gastroesophageal reflux disease without esophagitis Avoid spicy foods Do not eat 2 hours prior to bedtime - dexlansoprazole (DEXILANT) 60 MG capsule; TAKE 1 CAPSULE (60 MG TOTAL) BY MOUTH 2 (TWO) TIMES DAILY.  Dispense: 180 capsule; Refill: 1  4. Hypothyroidism, unspecified type - TSH - levothyroxine (SYNTHROID, LEVOTHROID) 200 MCG tablet; Take 1 tablet (200 mcg total) by mouth daily before breakfast.  Dispense: 30 tablet; Refill: 3  5. Type 2 diabetes mellitus with hyperglycemia, with long-term current use of insulin (HCC) Stricter carb counting Increase tresiba to 65 u nightly Stricter carb counting - Bayer DCA  Hb A1c Waived - Microalbumin / creatinine urine ratio  6. Hyperlipidemia, unspecified hyperlipidemia type Low fat diet - Lipid panel  7. GAD (generalized anxiety disorder) Stress management - clonazePAM (KLONOPIN) 0.5 MG tablet; Take 1 tablet (0.5 mg total) by mouth 2 (two) times daily as needed.  Dispense: 60 tablet; Refill: 2  8. Recurrent major depressive disorder, in full remission ([place]) - escitalopram (LEXAPRO) 20 MG tablet; Take 1 tablet (20 mg total) by mouth daily.  Dispense: 90 tablet; Refill: 1  9. Insomnia, unspecified type Bedtime routine  zolpidem (AMBIEN) 10 MG tablet; TAKE 1 TABLET BY MOUTH EVERY DAY AT BEDTIME AS NEEDED  Dispense: 30 tablet; Refill: 2   10. Severe obesity (BMI >= 40) (HCC) Mucous membranes are moist.  11. Hypothyroidism due to medication - levothyroxine (SYNTHROID, LEVOTHROID) 175 MCG tablet; 1 po qd Monday- saturday  Dispense:  90 tablet; Refill: 1  12. Mixed hyperlipidemia Low fat diet - pravastatin (PRAVACHOL) 40 MG tablet; TAKE 1 TABLET (40 MG TOTAL) BY MOUTH EVERY EVENING.  Dispense: 90 tablet; Refill: 1  - Labs pending Health Maintenance reviewed Diet and exercise encouraged  Follow up plan: 3 months   Mary-Margaret Hassell Done, FNP

## 2018-02-15 LAB — CMP14+EGFR
ALK PHOS: 80 IU/L (ref 39–117)
ALT: 19 IU/L (ref 0–32)
AST: 28 IU/L (ref 0–40)
Albumin/Globulin Ratio: 2.1 (ref 1.2–2.2)
Albumin: 4 g/dL (ref 3.6–4.8)
BILIRUBIN TOTAL: 0.3 mg/dL (ref 0.0–1.2)
BUN/Creatinine Ratio: 19 (ref 12–28)
BUN: 15 mg/dL (ref 8–27)
CHLORIDE: 99 mmol/L (ref 96–106)
CO2: 28 mmol/L (ref 20–29)
Calcium: 9.4 mg/dL (ref 8.7–10.3)
Creatinine, Ser: 0.77 mg/dL (ref 0.57–1.00)
GFR calc Af Amer: 95 mL/min/{1.73_m2} (ref >59)
GFR calc non Af Amer: 82 mL/min/{1.73_m2} (ref >59)
GLUCOSE: 119 mg/dL — AB (ref 65–99)
Globulin, Total: 1.9 g/dL (ref 1.5–4.5)
POTASSIUM: 4.2 mmol/L (ref 3.5–5.2)
Sodium: 141 mmol/L (ref 134–144)
Total Protein: 5.9 g/dL — ABNORMAL LOW (ref 6.0–8.5)

## 2018-02-15 LAB — LIPID PANEL
CHOL/HDL RATIO: 6 ratio — AB (ref 0.0–4.4)
CHOLESTEROL TOTAL: 175 mg/dL (ref 100–199)
HDL: 29 mg/dL — AB (ref >39)
LDL CALC: 109 mg/dL — AB (ref 0–99)
TRIGLYCERIDES: 186 mg/dL — AB (ref 0–149)
VLDL Cholesterol Cal: 37 mg/dL (ref 5–40)

## 2018-02-15 LAB — MICROALBUMIN / CREATININE URINE RATIO
Creatinine, Urine: 58.1 mg/dL
MICROALB/CREAT RATIO: 12.9 mg/g{creat} (ref 0.0–30.0)
Microalbumin, Urine: 7.5 ug/mL

## 2018-02-15 LAB — TSH: TSH: 2.91 u[IU]/mL (ref 0.450–4.500)

## 2018-02-25 ENCOUNTER — Other Ambulatory Visit: Payer: Self-pay | Admitting: Nurse Practitioner

## 2018-02-25 DIAGNOSIS — E032 Hypothyroidism due to medicaments and other exogenous substances: Secondary | ICD-10-CM

## 2018-03-20 ENCOUNTER — Encounter: Payer: Self-pay | Admitting: Physician Assistant

## 2018-03-20 ENCOUNTER — Ambulatory Visit: Payer: BLUE CROSS/BLUE SHIELD | Admitting: Physician Assistant

## 2018-03-20 VITALS — BP 128/71 | HR 81 | Temp 99.3°F | Ht 65.0 in | Wt 287.0 lb

## 2018-03-20 DIAGNOSIS — B354 Tinea corporis: Secondary | ICD-10-CM

## 2018-03-20 MED ORDER — FLUCONAZOLE 100 MG PO TABS: 100.0000 mg | ORAL_TABLET | Freq: Every day | ORAL | 2 refills | Status: DC

## 2018-03-21 NOTE — Progress Notes (Signed)
BP 128/71   Pulse 81   Temp 99.3 F (37.4 C) (Oral)   Ht _0  (1.651 m)   Wt 287 lb (130.2 kg)   LMP 05/24/1986 (Approximate)   BMI 47.76 kg/m    Subjective:    Patient ID: [name], female    DOB: [birth year], 62 y.o.   MRN: [id number]  HPI: [name] is a 62 y.o. female presenting on 03/20/2018 for Rash (Groin, abdomen, upper pubic area)  This patient comes have been having a rash on her lower that is red and itchy at times.  She denies any drainage.  She denies any bumps or lesions.  She denies any exposure to anything in the past.  She does have diabetes and she has had problems in the with yeast infections were more in the vaginal and perineal area.  She has had a history of oral thrush.  Past Medical History:  Diagnosis Date  . Abnormal Pap smear of cervix 1988   prior to hysterectomy  . Anxiety   . Arthritis 2015   spinal stenosis - Dr. Nelva Bush.  Epidural steroids  . Basal cell carcinoma of face 2008   Left  . Depression   . Diabetes mellitus without complication ([place])   . Exposure to TB    Treated x 6 months  . GERD (gastroesophageal reflux disease)   . Hiatal hernia   . Hyperlipidemia    diet controlled  . Hyperplastic colonic polyp 2003 & 2008   #3 polyps first, 1 second time  . Hypertension   . IBS (irritable bowel syndrome)   . Migraines   . OSA (obstructive sleep apnea) 07/25/2012   C- pap  . 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 Skin cancer    basal cell and 1 squamous cell on face and neck  . Thyroid disease 2011   hyperthyroid and treated with radio active iodine - now hypothyroid  . Ulcer 1982   gastric   Relevant past medical, surgical, family and social history reviewed and updated as indicated. Interim medical history since our last visit reviewed. Allergies and medications reviewed and updated. DATA REVIEWED: CHART IN EPIC  Family History reviewed for pertinent findings.  Review of Systems  Constitutional:  Negative.   HENT: Negative.   Eyes: Negative.   Respiratory: Negative.   Gastrointestinal: Negative.   Genitourinary: Negative.   Skin: Positive for color change and rash.    Allergies as of 03/20/2018      Reactions   Metformin And Related Diarrhea   severe diarrhea   Lipitor [atorvastatin Calcium]    myalgia   Statins    Myalgia   Zocor [simvastatin - High Dose]    myalgia      Medication List        Accurate as of 03/20/18 11:59 PM. Always use your most recent med list.          ACCU-CHEK FASTCLIX LANCETS Misc Use to chack blood sugar 2x a day and prn  Dx E11.9   ACCU-CHEK GUIDE test strip Generic drug:  glucose blood CHECK BLOOD SUGAR TWICE DAILY AND AS NEEDED   aspirin EC 81 MG tablet Take 1 tablet (81 mg total) by mouth daily.   atenolol 100 MG tablet Commonly known as:  TENORMIN Take 1 tablet (100 mg total) by mouth daily.   B-D UF III MINI PEN NEEDLES 31G X 5 MM Misc Generic drug:  Insulin Pen Needle USE AS  DIRECTED WITH TRESIBA E11.9   CALCIUM 600 + D PO Take 1 tablet by mouth daily.   clonazePAM 0.5 MG tablet Commonly known as:  KLONOPIN Take 1 tablet (0.5 mg total) by mouth 2 (two) times daily as needed.   dexlansoprazole 60 MG capsule Commonly known as:  DEXILANT TAKE 1 CAPSULE (60 MG TOTAL) BY MOUTH 2 (TWO) TIMES DAILY.   Dulaglutide 1.5 MG/0.5ML Sopn Inject 1.5 mg into the skin once a week.   escitalopram 20 MG tablet Commonly known as:  LEXAPRO Take 1 tablet (20 mg total) by mouth daily.   fluconazole 100 MG tablet Commonly known as:  DIFLUCAN Take 1 tablet (100 mg total) by mouth daily.   hydrochlorothiazide 25 MG tablet Commonly known as:  HYDRODIURIL TAKE 1 TABLET (25 MG TOTAL) BY MOUTH DAILY.   Insulin Degludec 200 UNIT/ML Sopn Inject 66 Units into the skin daily.   levothyroxine 175 MCG tablet Commonly known as:  SYNTHROID, LEVOTHROID TAKE 1 TABLET (175 MCG TOTAL) BY MOUTH DAILY. EXCEPT SUNDAY. TAKES 200 MG TAB ON  SUNDAY.   pravastatin 40 MG tablet Commonly known as:  PRAVACHOL TAKE 1 TABLET (40 MG TOTAL) BY MOUTH EVERY EVENING.   sitaGLIPtin 100 MG tablet Commonly known as:  JANUVIA Take 1 tablet (100 mg total) by mouth daily.   zolpidem 10 MG tablet Commonly known as:  AMBIEN TAKE 1 TABLET BY MOUTH EVERY DAY AT BEDTIME AS NEEDED          Objective:    BP 128/71   Pulse 81   Temp 99.3 F (37.4 C) (Oral)   Ht _0  (1.651 m)   Wt 287 lb (130.2 kg)   LMP 05/24/1986 (Approximate)   BMI 47.76 kg/m   Allergies  Allergen Reactions  . Metformin And Related Diarrhea    severe diarrhea  . Lipitor [Atorvastatin Calcium]     myalgia  . Statins     Myalgia   . Zocor [Simvastatin - High Dose]     myalgia    Wt Readings from Last 3 Encounters:  03/20/18 287 lb (130.2 kg)  02/14/18 283 lb 9.6 oz (128.6 kg)  11/10/17 287 lb (130.2 kg)    Physical Exam  Constitutional: She is oriented to person, place, and time. She appears well-developed and well-nourished.  HENT:  Head: Normocephalic and atraumatic.  Eyes: Pupils are equal, round, and reactive to light. Conjunctivae and EOM are normal.  Cardiovascular: Normal rate, regular rhythm, normal heart sounds and intact distal pulses.  Pulmonary/Chest: Effort normal and breath sounds normal.  Abdominal: Soft. Bowel sounds are normal.  Neurological: She is alert and oriented to person, place, and time. She has normal reflexes.  Skin: Skin is warm and dry. Rash noted. Rash is macular. There is erythema.     Psychiatric: She has a normal mood and affect. Her behavior is normal. Judgment and thought content normal.    Results for orders placed or performed in visit on 02/14/18  CMP14+EGFR  Result Value Ref Range   Glucose 119 (H) 65 - 99 mg/dL   BUN 15 8 - 27 mg/dL   Creatinine, Ser 0.77 0.57 - 1.00 mg/dL   GFR calc non Af Amer 82 >59 mL/min/1.73   GFR calc Af Amer 95 >59 mL/min/1.73   BUN/Creatinine Ratio 19 12 - 28   Sodium 141  134 - 144 mmol/L   Potassium 4.2 3.5 - 5.2 mmol/L   Chloride 99 96 - 106 mmol/L   CO2 28 20 -  29 mmol/L   Calcium 9.4 8.7 - 10.3 mg/dL   Total Protein 5.9 (L) 6.0 - 8.5 g/dL   Albumin 4.0 3.6 - 4.8 g/dL   Globulin, Total 1.9 1.5 - 4.5 g/dL   Albumin/Globulin Ratio 2.1 1.2 - 2.2   Bilirubin Total 0.3 0.0 - 1.2 mg/dL   Alkaline Phosphatase 80 39 - 117 IU/L   AST 28 0 - 40 IU/L   ALT 19 0 - 32 IU/L  Lipid panel  Result Value Ref Range   Cholesterol, Total 175 100 - 199 mg/dL   Triglycerides 186 (H) 0 - 149 mg/dL   HDL 29 (L) >39 mg/dL   VLDL Cholesterol Cal 37 5 - 40 mg/dL   LDL Calculated 109 (H) 0 - 99 mg/dL   Chol/HDL Ratio 6.0 (H) 0.0 - 4.4 ratio  Bayer DCA Hb A1c Waived  Result Value Ref Range   HB A1C (BAYER DCA - WAIVED) 8.2 (H) <7.0 %  TSH  Result Value Ref Range   TSH 2.910 0.450 - 4.500 uIU/mL  Microalbumin / creatinine urine ratio  Result Value Ref Range   Creatinine, Urine 58.1 Not Estab. mg/dL   Microalbumin, Urine 7.5 Not Estab. ug/mL   Microalb/Creat Ratio 12.9 0.0 - 30.0 mg/g creat      Assessment & Plan:   1. Tinea corporis - fluconazole (DIFLUCAN) 100 MG tablet; Take 1 tablet (100 mg total) by mouth daily.  Dispense: 10 tablet; Refill: 2   Continue all other maintenance medications as listed above.  Follow up plan: No follow-ups on file.  Educational handout given for [place] PA-C [facility] [address]  [place], [place] [address] [phone]   03/21/2018, 10:46 PM

## 2018-04-08 ENCOUNTER — Ambulatory Visit: Payer: BLUE CROSS/BLUE SHIELD | Admitting: Physician Assistant

## 2018-04-08 ENCOUNTER — Encounter: Payer: Self-pay | Admitting: Physician Assistant

## 2018-04-08 VITALS — BP 125/62 | HR 91 | Temp 97.6°F | Ht 65.0 in | Wt 285.0 lb

## 2018-04-08 DIAGNOSIS — R21 Rash and other nonspecific skin eruption: Secondary | ICD-10-CM

## 2018-04-08 DIAGNOSIS — T7840XA Allergy, unspecified, initial encounter: Secondary | ICD-10-CM | POA: Diagnosis not present

## 2018-04-08 MED ORDER — CEPHALEXIN 500 MG PO CAPS: 500.0000 mg | ORAL_CAPSULE | Freq: Three times a day (TID) | ORAL | 0 refills | Status: DC

## 2018-04-08 MED ORDER — METHYLPREDNISOLONE ACETATE 80 MG/ML IJ SUSP
80.0000 mg | Freq: Once | INTRAMUSCULAR | Status: AC
  Administered 2018-04-08: 80 mg via INTRAMUSCULAR

## 2018-04-08 MED ORDER — LORATADINE 10 MG PO TABS: 10.0000 mg | ORAL_TABLET | Freq: Every day | ORAL | 11 refills | Status: DC

## 2018-04-10 NOTE — Progress Notes (Signed)
Acute Office Visit  Subjective:    Patient ID: [name], female    DOB: 1955-10-26, 62 y.o.   MRN: [id number]  Chief Complaint  Patient presents with  . Rash    right arm (itch and drainging) started Thursday    HPI Patient is in today for new rash over the past 4 days.  She does not know of anything she is coming to contact with out of the norm.  She had no change in her daily products as far as detergents or personal care items.  She has significant itching of the lesion.  There is no one else in her family with it.  She does have an animal that goes outside.  Past Medical History:  Diagnosis Date  . Abnormal Pap smear of cervix 1988   prior to hysterectomy  . Anxiety   . Arthritis 2015   spinal stenosis - Dr. Nelva Bush.  Epidural steroids  . Basal cell carcinoma of face 2008   Left  . Depression   . Diabetes mellitus without complication ([place])   . Exposure to TB    Treated x 6 months  . GERD (gastroesophageal reflux disease)   . Hiatal hernia   . Hyperlipidemia    diet controlled  . Hyperplastic colonic polyp 2003 & 2008   #3 polyps first, 1 second time  . Hypertension   . IBS (irritable bowel syndrome)   . Migraines   . OSA (obstructive sleep apnea) 07/25/2012   C- pap  . 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 Skin cancer    basal cell and 1 squamous cell on face and neck  . Thyroid disease 2011   hyperthyroid and treated with radio active iodine - now hypothyroid  . Ulcer 1982   gastric    Past Surgical History:  Procedure Laterality Date  . COLONOSCOPY    . MOHS SURGERY  2006   left side of face  . POLYPECTOMY  colon  . THYROID SURGERY  2011   radioactive  . VAGINAL HYSTERECTOMY  1988   DUB and endometriosis    Family History  Adopted: Yes  Problem Relation Age of Onset  . Lung cancer Mother   . Lung cancer Maternal Aunt   . Stroke Maternal Grandfather   . Diabetes Maternal Grandfather     Social History   Socioeconomic History    . Marital status: Married    Spouse name: Not on file  . Number of children: Not on file  . Years of education: Not on file  . Highest education level: Not on file  Occupational History  . Occupation: retired Geologist, engineering  Social Needs  . Financial resource strain: Not on file  . Food insecurity:    Worry: Not on file    Inability: Not on file  . Transportation needs:    Medical: Not on file    Non-medical: Not on file  Tobacco Use  . Smoking status: Never Smoker  . Smokeless tobacco: Never Used  . Tobacco comment: socially  Substance and Sexual Activity  . Alcohol use: No  . Drug use: No  . Sexual activity: Yes    Partners: Male    Birth control/protection: Surgical    Comment: hysterectomy  Lifestyle  . Physical activity:    Days per week: Not on file    Minutes per session: Not on file  . Stress: Not on file  Relationships  . Social connections:  Talks on phone: Not on file    Gets together: Not on file    Attends religious service: Not on file    Active member of club or organization: Not on file    Attends meetings of clubs or organizations: Not on file    Relationship status: Not on file  . Intimate partner violence:    Fear of current or ex partner: Not on file    Emotionally abused: Not on file    Physically abused: Not on file    Forced sexual activity: Not on file  Other Topics Concern  . Not on file  Social History Narrative  . Not on file    Outpatient Medications Prior to Visit  Medication Sig Dispense Refill  . ACCU-CHEK FASTCLIX LANCETS MISC Use to chack blood sugar 2x a day and prn  Dx E11.9 300 each 1  . ACCU-CHEK GUIDE test strip CHECK BLOOD SUGAR TWICE DAILY AND AS NEEDED 200 each 3  . aspirin EC 81 MG tablet Take 1 tablet (81 mg total) by mouth daily.    Marland Kitchen atenolol (TENORMIN) 100 MG tablet Take 1 tablet (100 mg total) by mouth daily. 90 tablet 1  . B-D UF III MINI PEN NEEDLES 31G X 5 MM MISC USE AS DIRECTED WITH TRESIBA E11.9 100  each 11  . Calcium Carbonate-Vitamin D (CALCIUM 600 + D PO) Take 1 tablet by mouth daily.     . clonazePAM (KLONOPIN) 0.5 MG tablet Take 1 tablet (0.5 mg total) by mouth 2 (two) times daily as needed. 60 tablet 2  . dexlansoprazole (DEXILANT) 60 MG capsule TAKE 1 CAPSULE (60 MG TOTAL) BY MOUTH 2 (TWO) TIMES DAILY. 180 capsule 1  . Dulaglutide (TRULICITY) 1.5 CL/2.7NT SOPN Inject 1.5 mg into the skin once a week. 6 mL 1  . escitalopram (LEXAPRO) 20 MG tablet Take 1 tablet (20 mg total) by mouth daily. 90 tablet 1  . fluconazole (DIFLUCAN) 100 MG tablet Take 1 tablet (100 mg total) by mouth daily. 10 tablet 2  . hydrochlorothiazide (HYDRODIURIL) 25 MG tablet TAKE 1 TABLET (25 MG TOTAL) BY MOUTH DAILY. 90 tablet 1  . Insulin Degludec (TRESIBA FLEXTOUCH) 200 UNIT/ML SOPN Inject 66 Units into the skin daily. 27 mL 1  . levothyroxine (SYNTHROID, LEVOTHROID) 175 MCG tablet TAKE 1 TABLET (175 MCG TOTAL) BY MOUTH DAILY. EXCEPT SUNDAY. TAKES 200 MG TAB ON SUNDAY. 90 tablet 3  . pravastatin (PRAVACHOL) 40 MG tablet TAKE 1 TABLET (40 MG TOTAL) BY MOUTH EVERY EVENING. 90 tablet 1  . sitaGLIPtin (JANUVIA) 100 MG tablet Take 1 tablet (100 mg total) by mouth daily. 90 tablet 1  . zolpidem (AMBIEN) 10 MG tablet TAKE 1 TABLET BY MOUTH EVERY DAY AT BEDTIME AS NEEDED 30 tablet 2   No facility-administered medications prior to visit.     Allergies  Allergen Reactions  . Metformin And Related Diarrhea    severe diarrhea  . Lipitor [Atorvastatin Calcium]     myalgia  . Statins     Myalgia   . Zocor [Simvastatin - High Dose]     myalgia    Review of Systems  Constitutional: Negative.  Negative for chills and fever.  HENT: Negative.   Respiratory: Negative.  Negative for cough.   Cardiovascular: Negative.  Negative for chest pain and palpitations.  Gastrointestinal: Negative.   Skin: Positive for itching and rash.  Neurological: Negative.        Objective:    Physical Exam  Constitutional: She  is  oriented to person, place, and time. She appears well-developed and well-nourished.  HENT:  Head: Normocephalic and atraumatic.  Eyes: Pupils are equal, round, and reactive to light. Conjunctivae and EOM are normal.  Cardiovascular: Normal rate, regular rhythm, normal heart sounds and intact distal pulses.  Pulmonary/Chest: Effort normal and breath sounds normal.  Abdominal: Soft. Bowel sounds are normal.  Neurological: She is alert and oriented to person, place, and time. She has normal reflexes.  Skin: Skin is warm and dry. Rash noted. Rash is vesicular. There is erythema.     Psychiatric: She has a normal mood and affect. Her behavior is normal. Judgment and thought content normal.    BP 125/62 (BP Location: Left Arm)   Pulse 91   Temp 97.6 F (36.4 C) (Oral)   Ht 5\' 5"  (1.651 m)   Wt 285 lb (129.3 kg)   LMP 05/24/1986 (Approximate)   BMI 47.43 kg/m  Wt Readings from Last 3 Encounters:  04/08/18 285 lb (129.3 kg)  03/20/18 287 lb (130.2 kg)  02/14/18 283 lb 9.6 oz (128.6 kg)        Assessment & Plan:   Problem List Items Addressed This Visit    None    Visit Diagnoses    Allergic reaction, initial encounter    -  Primary   Relevant Medications   loratadine (CLARITIN) 10 MG tablet   methylPREDNISolone acetate (DEPO-MEDROL) injection 80 mg (Completed)   cephALEXin (KEFLEX) 500 MG capsule       Meds ordered this encounter  Medications  . loratadine (CLARITIN) 10 MG tablet    Sig: Take 1 tablet (10 mg total) by mouth daily.    Dispense:  30 tablet    Refill:  11    Order Specific Question:   Supervising Provider    Answer:   Janora Norlander [[id number]]  . methylPREDNISolone acetate (DEPO-MEDROL) injection 80 mg  . cephALEXin (KEFLEX) 500 MG capsule    Sig: Take 1 capsule (500 mg total) by mouth 3 (three) times daily.    Dispense:  21 capsule    Refill:  0    Order Specific Question:   Supervising Provider    Answer:   Janora Norlander [[id number]]      Terald Sleeper, PA-C

## 2018-05-19 ENCOUNTER — Encounter: Payer: Self-pay | Admitting: Nurse Practitioner

## 2018-05-19 ENCOUNTER — Ambulatory Visit: Payer: BLUE CROSS/BLUE SHIELD | Admitting: Nurse Practitioner

## 2018-05-19 VITALS — BP 139/74 | HR 70 | Temp 98.0°F | Ht 65.0 in | Wt 286.0 lb

## 2018-05-19 DIAGNOSIS — G4733 Obstructive sleep apnea (adult) (pediatric): Secondary | ICD-10-CM

## 2018-05-19 DIAGNOSIS — I10 Essential (primary) hypertension: Secondary | ICD-10-CM | POA: Diagnosis not present

## 2018-05-19 DIAGNOSIS — Z794 Long term (current) use of insulin: Secondary | ICD-10-CM

## 2018-05-19 DIAGNOSIS — E785 Hyperlipidemia, unspecified: Secondary | ICD-10-CM | POA: Diagnosis not present

## 2018-05-19 DIAGNOSIS — E032 Hypothyroidism due to medicaments and other exogenous substances: Secondary | ICD-10-CM

## 2018-05-19 DIAGNOSIS — J0100 Acute maxillary sinusitis, unspecified: Secondary | ICD-10-CM

## 2018-05-19 DIAGNOSIS — K219 Gastro-esophageal reflux disease without esophagitis: Secondary | ICD-10-CM

## 2018-05-19 DIAGNOSIS — E1165 Type 2 diabetes mellitus with hyperglycemia: Secondary | ICD-10-CM | POA: Diagnosis not present

## 2018-05-19 DIAGNOSIS — G4700 Insomnia, unspecified: Secondary | ICD-10-CM

## 2018-05-19 DIAGNOSIS — F5101 Primary insomnia: Secondary | ICD-10-CM

## 2018-05-19 DIAGNOSIS — E039 Hypothyroidism, unspecified: Secondary | ICD-10-CM

## 2018-05-19 DIAGNOSIS — F411 Generalized anxiety disorder: Secondary | ICD-10-CM

## 2018-05-19 DIAGNOSIS — E782 Mixed hyperlipidemia: Secondary | ICD-10-CM

## 2018-05-19 DIAGNOSIS — F3342 Major depressive disorder, recurrent, in full remission: Secondary | ICD-10-CM

## 2018-05-19 LAB — BAYER DCA HB A1C WAIVED: HB A1C (BAYER DCA - WAIVED): 7.9 % — ABNORMAL HIGH (ref <7.0)

## 2018-05-19 MED ORDER — HYDROCHLOROTHIAZIDE 25 MG PO TABS: ORAL_TABLET | ORAL | 1 refills | Status: DC

## 2018-05-19 MED ORDER — DEXLANSOPRAZOLE 60 MG PO CPDR: DELAYED_RELEASE_CAPSULE | ORAL | 1 refills | Status: DC

## 2018-05-19 MED ORDER — PRAVASTATIN SODIUM 40 MG PO TABS: ORAL_TABLET | ORAL | 1 refills | Status: DC

## 2018-05-19 MED ORDER — CLONAZEPAM 0.5 MG PO TABS: 0.5000 mg | ORAL_TABLET | Freq: Two times a day (BID) | ORAL | 3 refills | Status: DC | PRN

## 2018-05-19 MED ORDER — ZOLPIDEM TARTRATE 10 MG PO TABS: ORAL_TABLET | ORAL | 3 refills | Status: DC

## 2018-05-19 MED ORDER — INSULIN DEGLUDEC 200 UNIT/ML ~~LOC~~ SOPN: 70.0000 [IU] | PEN_INJECTOR | Freq: Every day | SUBCUTANEOUS | 1 refills | Status: DC

## 2018-05-19 MED ORDER — LEVOTHYROXINE SODIUM 175 MCG PO TABS: 175.0000 ug | ORAL_TABLET | Freq: Every day | ORAL | 1 refills | Status: DC

## 2018-05-19 MED ORDER — ESCITALOPRAM OXALATE 20 MG PO TABS: 20.0000 mg | ORAL_TABLET | Freq: Every day | ORAL | 1 refills | Status: DC

## 2018-05-19 MED ORDER — AMOXICILLIN-POT CLAVULANATE 875-125 MG PO TABS: 1.0000 | ORAL_TABLET | Freq: Two times a day (BID) | ORAL | 0 refills | Status: DC

## 2018-05-19 MED ORDER — ATENOLOL 100 MG PO TABS: 100.0000 mg | ORAL_TABLET | Freq: Every day | ORAL | 1 refills | Status: DC

## 2018-05-19 MED ORDER — DULAGLUTIDE 1.5 MG/0.5ML ~~LOC~~ SOAJ: 1.5000 mg | SUBCUTANEOUS | 1 refills | Status: DC

## 2018-05-19 NOTE — Patient Instructions (Signed)

## 2018-05-19 NOTE — Progress Notes (Signed)
Subjective:    Patient ID: [name], female    DOB: October 25, 1955, 62 y.o.   MRN: [id number]   Chief Complaint: Medical management of chronic issues  HPI:  1. Type 2 diabetes mellitus with hyperglycemia, with long-term current use of insulin (HCC) Last hgba1c was  8.2%. her blood sugars have been below 170. She has not been taking [place] because to expensive. [name] been doing her trulicity and tresiba.   2. Essential hypertension  No c/o chest pain, sob or headache. Does not check blood pressure at home. BP Readings from Last 3 Encounters:  04/08/18 125/62  03/20/18 128/71  02/14/18 131/68     3. Hyperlipidemia, unspecified hyperlipidemia type  Does not watch diet and does no exercise.  4. OSA (obstructive sleep apnea)  Wears CPAP nightly. Does not sleep well if does not wear.  5. Gastroesophageal reflux disease without esophagitis  She takes dexilant daily to keep symptoms under control.  6. Hypothyroidism, unspecified type  She use to see Dr. Merlene Laughter , but she was not really doing anything so we are now monitoring her TSH.  7. Severe obesity (BMI >= 40) (HCC) No recent weight changes  8. Insomnia, unspecified type  Is not able to sleep without taking ambien  9. GAD (generalized anxiety disorder)  Takes klonopin BID. 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  10. Recurrent major depressive disorder, in full remission ([place])  Is on lexapro daily. Says she is doing well right now. Depression screen [facility] 2/9 05/19/2018 04/08/2018 03/20/2018  Decreased Interest 0 0 0  Down, Depressed, Hopeless 0 0 0  PHQ - 2 Score 0 0 0       Outpatient Encounter Medications as of 05/19/2018  Medication Sig  . ACCU-CHEK FASTCLIX LANCETS MISC Use to chack blood sugar 2x a day and prn  Dx E11.9  . ACCU-CHEK GUIDE test strip CHECK BLOOD SUGAR TWICE DAILY AND AS NEEDED  . aspirin EC 81 MG tablet Take 1 tablet (81 mg total) by mouth daily.  Marland Kitchen atenolol  (TENORMIN) 100 MG tablet Take 1 tablet (100 mg total) by mouth daily.  . B-D UF III MINI PEN NEEDLES 31G X 5 MM MISC USE AS DIRECTED WITH TRESIBA E11.9  . Calcium Carbonate-Vitamin D (CALCIUM 600 + D PO) Take 1 tablet by mouth daily.   . cephALEXin (KEFLEX) 500 MG capsule Take 1 capsule (500 mg total) by mouth 3 (three) times daily.  . clonazePAM (KLONOPIN) 0.5 MG tablet Take 1 tablet (0.5 mg total) by mouth 2 (two) times daily as needed.  Marland Kitchen dexlansoprazole (DEXILANT) 60 MG capsule TAKE 1 CAPSULE (60 MG TOTAL) BY MOUTH 2 (TWO) TIMES DAILY.  . Dulaglutide (TRULICITY) 1.5 WU/9.8JX SOPN Inject 1.5 mg into the skin once a week.  . escitalopram (LEXAPRO) 20 MG tablet Take 1 tablet (20 mg total) by mouth daily.  . fluconazole (DIFLUCAN) 100 MG tablet Take 1 tablet (100 mg total) by mouth daily.  . hydrochlorothiazide (HYDRODIURIL) 25 MG tablet TAKE 1 TABLET (25 MG TOTAL) BY MOUTH DAILY.  Marland Kitchen Insulin Degludec (TRESIBA FLEXTOUCH) 200 UNIT/ML SOPN Inject 66 Units into the skin daily.  Marland Kitchen levothyroxine (SYNTHROID, LEVOTHROID) 175 MCG tablet TAKE 1 TABLET (175 MCG TOTAL) BY MOUTH DAILY. EXCEPT SUNDAY. TAKES 200 MG TAB ON SUNDAY.  Marland Kitchen loratadine (CLARITIN) 10 MG tablet Take 1 tablet (10 mg total) by mouth daily.  . pravastatin (PRAVACHOL) 40 MG tablet TAKE 1 TABLET (40 MG TOTAL) BY MOUTH  EVERY EVENING.  . sitaGLIPtin (JANUVIA) 100 MG tablet Take 1 tablet (100 mg total) by mouth daily.  . zolpidem (AMBIEN) 10 MG tablet TAKE 1 TABLET BY MOUTH EVERY DAY AT BEDTIME AS NEEDED       New complaints: Started having sinus congestion and facial pressure 3 days ago. started coughing this morning. Slight sore throat. No fever. She has been taking sudafed which has helped a little  Social history: Lives with husband. They are both retired. Has a daughte rthat [name] to help a lot.   Review of Systems  Constitutional: Negative for activity change and appetite change.  HENT: Negative.   Eyes: Negative for pain.    Respiratory: Negative for shortness of breath.   Cardiovascular: Negative for chest pain, palpitations and leg swelling.  Gastrointestinal: Negative for abdominal pain.  Endocrine: Negative for polydipsia.  Genitourinary: Negative.   Skin: Negative for rash.  Neurological: Negative for dizziness, weakness and headaches.  Hematological: Does not bruise/bleed easily.  Psychiatric/Behavioral: Negative.   All other systems reviewed and are negative.      Objective:   Physical Exam Vitals signs and nursing note reviewed.  Constitutional:      General: She is not in acute distress.    Appearance: Normal appearance. She is well-developed.  HENT:     Head: Normocephalic.     Right Ear: Hearing, tympanic membrane, ear canal and external ear normal.     Left Ear: Hearing, tympanic membrane, ear canal and external ear normal.     Nose: Mucosal edema, congestion and rhinorrhea present.     Right Sinus: Maxillary sinus tenderness present. No frontal sinus tenderness.     Left Sinus: Maxillary sinus tenderness present. No frontal sinus tenderness.     Mouth/Throat:     Mouth: Mucous membranes are moist.     Pharynx: Oropharynx is clear.  Eyes:     Pupils: Pupils are equal, round, and reactive to light.  Neck:     Musculoskeletal: Normal range of motion and neck supple.     Vascular: No carotid bruit or JVD.  Cardiovascular:     Rate and Rhythm: Normal rate and regular rhythm.     Heart sounds: Normal heart sounds.  Pulmonary:     Effort: Pulmonary effort is normal. No respiratory distress.     Breath sounds: Normal breath sounds. No wheezing or rales.  Chest:     Chest wall: No tenderness.  Abdominal:     General: Bowel sounds are normal. There is no distension or abdominal bruit.     Palpations: Abdomen is soft. There is no hepatomegaly, splenomegaly, mass or pulsatile mass.     Tenderness: There is no abdominal tenderness.  Musculoskeletal: Normal range of motion.   Lymphadenopathy:     Cervical: No cervical adenopathy.  Skin:    General: Skin is warm and dry.  Neurological:     Mental Status: She is alert and oriented to person, place, and time.     Deep Tendon Reflexes: Reflexes are normal and symmetric.  Psychiatric:        Behavior: Behavior normal.        Thought Content: Thought content normal.        Judgment: Judgment normal.    BP 139/74   Pulse 70   Temp 98 F (36.7 C) (Oral)   Ht 5' 5" (1.651 m)   Wt 286 lb (129.7 kg)   LMP 05/24/1986 (Approximate)   BMI 47.59 kg/m   hgba1c   7.9      Assessment & Plan:  Zeynab Bibian comes in today with chief complaint of Medical Management of Chronic Issues (sinus)   Diagnosis and orders addressed:  1. Type 2 diabetes mellitus with hyperglycemia, with long-term current use of insulin (HCC) Stricter crb counting Increased tresiba to 70u at night - Bayer DCA Hb A1c Waived - Insulin Degludec (TRESIBA FLEXTOUCH) 200 UNIT/ML SOPN; Inject 70 Units into the skin daily.  Dispense: 27 mL; Refill: 1 - Dulaglutide (TRULICITY) 1.5 MG/0.5ML SOPN; Inject 1.5 mg into the skin once a week.  Dispense: 6 mL; Refill: 1  2. Essential hypertension Low sodium diet - CMP14+EGFR - atenolol (TENORMIN) 100 MG tablet; Take 1 tablet (100 mg total) by mouth daily.  Dispense: 90 tablet; Refill: 1 - hydrochlorothiazide (HYDRODIURIL) 25 MG tablet; TAKE 1 TABLET (25 MG TOTAL) BY MOUTH DAILY.  Dispense: 90 tablet; Refill: 1  3. Hyperlipidemia, unspecified hyperlipidemia type Low fta diet - Lipid panel  4. OSA (obstructive sleep apnea) Continue to wear cpap nightly  5. Gastroesophageal reflux disease without esophagitis Avoid spicy foods Do not eat 2 hours prior to bedtime - dexlansoprazole (DEXILANT) 60 MG capsule; TAKE 1 CAPSULE (60 MG TOTAL) BY MOUTH 2 (TWO) TIMES DAILY.  Dispense: 180 capsule; Refill: 1  6. Hypothyroidism, unspecified type  7. Severe obesity (BMI >= 40) (HCC) Discussed diet and  exercise for person with BMI >25 Will recheck weight in 3-6 months  8. Primary insomnia Bedtime routine - zolpidem (AMBIEN) 10 MG tablet; TAKE 1 TABLET BY MOUTH EVERY DAY AT BEDTIME AS NEEDED  Dispense: 30 tablet; Refill: 3   9. GAD (generalized anxiety disorder) stres management - clonazePAM (KLONOPIN) 0.5 MG tablet; Take 1 tablet (0.5 mg total) by mouth 2 (two) times daily as needed.  Dispense: 60 tablet; Refill: 3  10. Recurrent major depressive disorder, in full remission (HCC) - escitalopram (LEXAPRO) 20 MG tablet; Take 1 tablet (20 mg total) by mouth daily.  Dispense: 90 tablet; Refill: 1  11. Hypothyroidism due to medication - levothyroxine (SYNTHROID, LEVOTHROID) 175 MCG tablet; Take 1 tablet (175 mcg total) by mouth daily. Except Sunday.  Takes 200 mg tab on Sunday.  Dispense: 90 tablet; Refill: 1  12. Mixed hyperlipidemia Lowfat diet - pravastatin (PRAVACHOL) 40 MG tablet; TAKE 1 TABLET (40 MG TOTAL) BY MOUTH EVERY EVENING.  Dispense: 90 tablet; Refill: 1  13. Acute maxillary sinusitis, recurrence not specified 1. Take meds as prescribed 2. Use a cool mist humidifier especially during the winter months and when heat has been humid. 3. Use saline nose sprays frequently 4. Saline irrigations of the nose can be very helpful if done frequently.  * 4X daily for 1 week*  * Use of a nettie pot can be helpful with this. Follow directions with this* 5. Drink plenty of fluids 6. Keep thermostat turn down low 7.For any cough or congestion  Use plain Mucinex- regular strength or max strength is fine   * Children- consult with Pharmacist for dosing 8. For fever or aces or pains- take tylenol or ibuprofen appropriate for age and weight.  * for fevers greater than 101 orally you may alternate ibuprofen and tylenol every  3 hours.    - amoxicillin-clavulanate (AUGMENTIN) 875-125 MG tablet; Take 1 tablet by mouth 2 (two) times daily.  Dispense: 14 tablet; Refill: 0   Labs  pending Health Maintenance reviewed Diet and exercise encouraged  Follow up plan: 3 months   Mary-Margaret Martin, FNP  

## 2018-05-20 LAB — CMP14+EGFR
ALBUMIN: 3.8 g/dL (ref 3.6–4.8)
ALT: 25 IU/L (ref 0–32)
AST: 32 IU/L (ref 0–40)
Albumin/Globulin Ratio: 1.9 (ref 1.2–2.2)
Alkaline Phosphatase: 89 IU/L (ref 39–117)
BUN/Creatinine Ratio: 16 (ref 12–28)
BUN: 15 mg/dL (ref 8–27)
Bilirubin Total: 0.5 mg/dL (ref 0.0–1.2)
CO2: 25 mmol/L (ref 20–29)
CREATININE: 0.95 mg/dL (ref 0.57–1.00)
Calcium: 10.1 mg/dL (ref 8.7–10.3)
Chloride: 101 mmol/L (ref 96–106)
GFR calc Af Amer: 74 mL/min/{1.73_m2} (ref >59)
GFR, EST NON AFRICAN AMERICAN: 64 mL/min/{1.73_m2} (ref >59)
GLOBULIN, TOTAL: 2 g/dL (ref 1.5–4.5)
Glucose: 113 mg/dL — ABNORMAL HIGH (ref 65–99)
Potassium: 4.9 mmol/L (ref 3.5–5.2)
SODIUM: 147 mmol/L — AB (ref 134–144)
Total Protein: 5.8 g/dL — ABNORMAL LOW (ref 6.0–8.5)

## 2018-05-20 LAB — LIPID PANEL
Chol/HDL Ratio: 5.7 ratio — ABNORMAL HIGH (ref 0.0–4.4)
Cholesterol, Total: 198 mg/dL (ref 100–199)
HDL: 35 mg/dL — ABNORMAL LOW (ref >39)
LDL CALC: 131 mg/dL — AB (ref 0–99)
TRIGLYCERIDES: 158 mg/dL — AB (ref 0–149)
VLDL Cholesterol Cal: 32 mg/dL (ref 5–40)

## 2018-05-24 HISTORY — PX: CYST REMOVAL TRUNK: SHX6283

## 2018-07-19 LAB — HM MAMMOGRAPHY

## 2018-08-10 ENCOUNTER — Telehealth: Payer: Self-pay | Admitting: Nurse Practitioner

## 2018-08-10 NOTE — Telephone Encounter (Signed)
Pt aware to keep appt at this time for controlled meds. If something changes we will notify her

## 2018-08-15 ENCOUNTER — Encounter: Payer: Self-pay | Admitting: Nurse Practitioner

## 2018-08-15 ENCOUNTER — Other Ambulatory Visit: Payer: Self-pay

## 2018-08-15 ENCOUNTER — Ambulatory Visit: Payer: BLUE CROSS/BLUE SHIELD | Admitting: Nurse Practitioner

## 2018-08-15 VITALS — BP 130/70 | HR 83 | Temp 97.5°F | Ht 65.0 in | Wt 285.0 lb

## 2018-08-15 DIAGNOSIS — E039 Hypothyroidism, unspecified: Secondary | ICD-10-CM | POA: Diagnosis not present

## 2018-08-15 DIAGNOSIS — K219 Gastro-esophageal reflux disease without esophagitis: Secondary | ICD-10-CM

## 2018-08-15 DIAGNOSIS — E1165 Type 2 diabetes mellitus with hyperglycemia: Secondary | ICD-10-CM

## 2018-08-15 DIAGNOSIS — I10 Essential (primary) hypertension: Secondary | ICD-10-CM

## 2018-08-15 DIAGNOSIS — F411 Generalized anxiety disorder: Secondary | ICD-10-CM

## 2018-08-15 DIAGNOSIS — F3342 Major depressive disorder, recurrent, in full remission: Secondary | ICD-10-CM

## 2018-08-15 DIAGNOSIS — E782 Mixed hyperlipidemia: Secondary | ICD-10-CM

## 2018-08-15 DIAGNOSIS — Z794 Long term (current) use of insulin: Secondary | ICD-10-CM

## 2018-08-15 DIAGNOSIS — F5101 Primary insomnia: Secondary | ICD-10-CM

## 2018-08-15 DIAGNOSIS — G4733 Obstructive sleep apnea (adult) (pediatric): Secondary | ICD-10-CM

## 2018-08-15 DIAGNOSIS — E785 Hyperlipidemia, unspecified: Secondary | ICD-10-CM

## 2018-08-15 DIAGNOSIS — G4700 Insomnia, unspecified: Secondary | ICD-10-CM

## 2018-08-15 DIAGNOSIS — E032 Hypothyroidism due to medicaments and other exogenous substances: Secondary | ICD-10-CM

## 2018-08-15 LAB — BAYER DCA HB A1C WAIVED: HB A1C (BAYER DCA - WAIVED): 7.6 % — ABNORMAL HIGH (ref <7.0)

## 2018-08-15 MED ORDER — HYDROCHLOROTHIAZIDE 25 MG PO TABS: ORAL_TABLET | ORAL | 1 refills | Status: DC

## 2018-08-15 MED ORDER — CLONAZEPAM 0.5 MG PO TABS: 0.5000 mg | ORAL_TABLET | Freq: Two times a day (BID) | ORAL | 3 refills | Status: DC | PRN

## 2018-08-15 MED ORDER — DULAGLUTIDE 1.5 MG/0.5ML ~~LOC~~ SOAJ: 1.5000 mg | SUBCUTANEOUS | 1 refills | Status: DC

## 2018-08-15 MED ORDER — DEXLANSOPRAZOLE 60 MG PO CPDR: DELAYED_RELEASE_CAPSULE | ORAL | 1 refills | Status: DC

## 2018-08-15 MED ORDER — ZOLPIDEM TARTRATE 10 MG PO TABS: ORAL_TABLET | ORAL | 3 refills | Status: DC

## 2018-08-15 MED ORDER — INSULIN DEGLUDEC 200 UNIT/ML ~~LOC~~ SOPN: 70.0000 [IU] | PEN_INJECTOR | Freq: Every day | SUBCUTANEOUS | 1 refills | Status: DC

## 2018-08-15 MED ORDER — ATENOLOL 100 MG PO TABS: 100.0000 mg | ORAL_TABLET | Freq: Every day | ORAL | 1 refills | Status: DC

## 2018-08-15 MED ORDER — ESCITALOPRAM OXALATE 20 MG PO TABS: 20.0000 mg | ORAL_TABLET | Freq: Every day | ORAL | 1 refills | Status: DC

## 2018-08-15 MED ORDER — LEVOTHYROXINE SODIUM 175 MCG PO TABS: 175.0000 ug | ORAL_TABLET | Freq: Every day | ORAL | 1 refills | Status: DC

## 2018-08-15 MED ORDER — PRAVASTATIN SODIUM 40 MG PO TABS: ORAL_TABLET | ORAL | 1 refills | Status: DC

## 2018-08-15 NOTE — Patient Instructions (Signed)
Diabetes Mellitus and Foot Care  Foot care is an important part of your health, especially when you have diabetes. Diabetes may cause you to have problems because of poor blood flow (circulation) to your feet and legs, which can cause your skin to:   Become thinner and drier.   Break more easily.   Heal more slowly.   Peel and crack.  You may also have nerve damage (neuropathy) in your legs and feet, causing decreased feeling in them. This means that you may not notice minor injuries to your feet that could lead to more serious problems. Noticing and addressing any potential problems early is the best way to prevent future foot problems.  How to care for your feet  Foot hygiene   Wash your feet daily with warm water and mild soap. Do not use hot water. Then, pat your feet and the areas between your toes until they are completely dry. Do not soak your feet as this can dry your skin.   Trim your toenails straight across. Do not dig under them or around the cuticle. File the edges of your nails with an emery board or nail file.   Apply a moisturizing lotion or petroleum jelly to the skin on your feet and to dry, brittle toenails. Use lotion that does not contain alcohol and is unscented. Do not apply lotion between your toes.  Shoes and socks   Wear clean socks or stockings every day. Make sure they are not too tight. Do not wear knee-high stockings since they may decrease blood flow to your legs.   Wear shoes that fit properly and have enough cushioning. Always look in your shoes before you put them on to be sure there are no objects inside.   To break in new shoes, wear them for just a few hours a day. This prevents injuries on your feet.  Wounds, scrapes, corns, and calluses   Check your feet daily for blisters, cuts, bruises, sores, and redness. If you cannot see the bottom of your feet, use a mirror or ask someone for help.   Do not cut corns or calluses or try to remove them with medicine.   If you  find a minor scrape, cut, or break in the skin on your feet, keep it and the skin around it clean and dry. You may clean these areas with mild soap and water. Do not clean the area with peroxide, alcohol, or iodine.   If you have a wound, scrape, corn, or callus on your foot, look at it several times a day to make sure it is healing and not infected. Check for:  ? Redness, swelling, or pain.  ? Fluid or blood.  ? Warmth.  ? Pus or a bad smell.  General instructions   Do not cross your legs. This may decrease blood flow to your feet.   Do not use heating pads or hot water bottles on your feet. They may burn your skin. If you have lost feeling in your feet or legs, you may not know this is happening until it is too late.   Protect your feet from hot and cold by wearing shoes, such as at the beach or on hot pavement.   Schedule a complete foot exam at least once a year (annually) or more often if you have foot problems. If you have foot problems, report any cuts, sores, or bruises to your health care provider immediately.  Contact a health care provider if:     You have a medical condition that increases your risk of infection and you have any cuts, sores, or bruises on your feet.   You have an injury that is not healing.   You have redness on your legs or feet.   You feel burning or tingling in your legs or feet.   You have pain or cramps in your legs and feet.   Your legs or feet are numb.   Your feet always feel cold.   You have pain around a toenail.  Get help right away if:   You have a wound, scrape, corn, or callus on your foot and:  ? You have pain, swelling, or redness that gets worse.  ? You have fluid or blood coming from the wound, scrape, corn, or callus.  ? Your wound, scrape, corn, or callus feels warm to the touch.  ? You have pus or a bad smell coming from the wound, scrape, corn, or callus.  ? You have a fever.  ? You have a red line going up your leg.  Summary   Check your feet every day  for cuts, sores, red spots, swelling, and blisters.   Moisturize feet and legs daily.   Wear shoes that fit properly and have enough cushioning.   If you have foot problems, report any cuts, sores, or bruises to your health care provider immediately.   Schedule a complete foot exam at least once a year (annually) or more often if you have foot problems.  This information is not intended to replace advice given to you by your health care provider. Make sure you discuss any questions you have with your health care provider.  Document Released: 05/07/2000 Document Revised: 06/22/2017 Document Reviewed: 06/11/2016  Elsevier Interactive Patient Education  2019 [employer]

## 2018-08-15 NOTE — Progress Notes (Signed)
Subjective:    Patient ID: [name], female    DOB: 09-14-1955, 64 y.o.   MRN: [id number]   Chief Complaint: medical management of chronic issues  HPI:  1. Essential hypertension  No c/o chest pain, sob or headache. Does not check blood pressure at home. BP Readings from Last 3 Encounters:  05/19/18 139/74  04/08/18 125/62  03/20/18 128/71     2. Hyperlipidemia, unspecified hyperlipidemia type  Does not watch diet and does very little exercise if any.  3. Type 2 diabetes mellitus with hyperglycemia, with long-term current use of insulin (HCC) Last hgba1c was 7.9%. we increased her tresiba to 70u daily. Patient has also started back on januvia because ins is now paying for it.  4. Hypothyroidism, unspecified type  No problems that aware of  5. GAD (generalized anxiety disorder)  Patient stays stressed all the time. She is on klonopin BID.  6. Recurrent major depressive disorder, in full remission [facility])  She is currently on lexapro and is doing well. Depression screen [facility] 2/9 08/15/2018 05/19/2018 04/08/2018  Decreased Interest 0 0 0  Down, Depressed, Hopeless 0 0 0  PHQ - 2 Score 0 0 0     7. Insomnia, unspecified type  Takes ambien to sleep at night. Cannot sleep if she does not take  8. Gastroesophageal reflux disease without esophagitis  Is on dexilant daily and works well to keep symptoms  Under control  9. OSA (obstructive sleep apnea)  Wears CPAP nightly. Says that she usually feels rested in mornings  10. Severe obesity (BMI >= 40) (HCC)  No recent weight changes     Outpatient Encounter Medications as of 08/15/2018  Medication Sig  . ACCU-CHEK FASTCLIX LANCETS MISC Use to chack blood sugar 2x a day and prn  Dx E11.9  . ACCU-CHEK GUIDE test strip CHECK BLOOD SUGAR TWICE DAILY AND AS NEEDED  . amoxicillin-clavulanate (AUGMENTIN) 875-125 MG tablet Take 1 tablet by mouth 2 (two) times daily.  Marland Kitchen aspirin EC 81 MG tablet Take 1 tablet (81 mg total) by mouth  daily.  Marland Kitchen atenolol (TENORMIN) 100 MG tablet Take 1 tablet (100 mg total) by mouth daily.  . B-D UF III MINI PEN NEEDLES 31G X 5 MM MISC USE AS DIRECTED WITH TRESIBA E11.9  . Calcium Carbonate-Vitamin D (CALCIUM 600 + D PO) Take 1 tablet by mouth daily.   . clonazePAM (KLONOPIN) 0.5 MG tablet Take 1 tablet (0.5 mg total) by mouth 2 (two) times daily as needed.  Marland Kitchen dexlansoprazole (DEXILANT) 60 MG capsule TAKE 1 CAPSULE (60 MG TOTAL) BY MOUTH 2 (TWO) TIMES DAILY.  . Dulaglutide (TRULICITY) 1.5 LP/3.7TK SOPN Inject 1.5 mg into the skin once a week.  . escitalopram (LEXAPRO) 20 MG tablet Take 1 tablet (20 mg total) by mouth daily.  . fluconazole (DIFLUCAN) 100 MG tablet Take 1 tablet (100 mg total) by mouth daily.  . hydrochlorothiazide (HYDRODIURIL) 25 MG tablet TAKE 1 TABLET (25 MG TOTAL) BY MOUTH DAILY.  Marland Kitchen Insulin Degludec (TRESIBA FLEXTOUCH) 200 UNIT/ML SOPN Inject 70 Units into the skin daily.  Marland Kitchen levothyroxine (SYNTHROID, LEVOTHROID) 175 MCG tablet Take 1 tablet (175 mcg total) by mouth daily. Except Sunday.  Takes 200 mg tab on Sunday.  . loratadine (CLARITIN) 10 MG tablet Take 1 tablet (10 mg total) by mouth daily.  . pravastatin (PRAVACHOL) 40 MG tablet TAKE 1 TABLET (40 MG TOTAL) BY MOUTH EVERY EVENING.  Marland Kitchen zolpidem (AMBIEN) 10 MG tablet TAKE 1 TABLET BY MOUTH  EVERY DAY AT BEDTIME AS NEEDED       New complaints: None tpday  Social history: Lives with her husband. She has a daughter that they have to check on daily and give her her anxiety meds. This keeps them both very stressed out.   Review of Systems  Constitutional: Negative for activity change and appetite change.  HENT: Negative.   Eyes: Negative for pain.  Respiratory: Negative for shortness of breath.   Cardiovascular: Negative for chest pain, palpitations and leg swelling.  Gastrointestinal: Negative for abdominal pain.  Endocrine: Negative for polydipsia.  Genitourinary: Negative.   Skin: Negative for rash.   Neurological: Negative for dizziness, weakness and headaches.  Hematological: Does not bruise/bleed easily.  Psychiatric/Behavioral: Negative.   All other systems reviewed and are negative.      Objective:   Physical Exam Vitals signs and nursing note reviewed.  Constitutional:      General: She is not in acute distress.    Appearance: Normal appearance. She is well-developed. She is obese.  HENT:     Head: Normocephalic.     Nose: Nose normal.  Eyes:     Pupils: Pupils are equal, round, and reactive to light.  Neck:     Musculoskeletal: Normal range of motion and neck supple.     Vascular: No carotid bruit or JVD.  Cardiovascular:     Rate and Rhythm: Normal rate and regular rhythm.     Heart sounds: Normal heart sounds.  Pulmonary:     Effort: Pulmonary effort is normal. No respiratory distress.     Breath sounds: Normal breath sounds. No wheezing or rales.  Chest:     Chest wall: No tenderness.  Abdominal:     General: Bowel sounds are normal. There is no distension or abdominal bruit.     Palpations: Abdomen is soft. There is no hepatomegaly, splenomegaly, mass or pulsatile mass.     Tenderness: There is no abdominal tenderness.  Musculoskeletal: Normal range of motion.  Lymphadenopathy:     Cervical: No cervical adenopathy.  Skin:    General: Skin is warm and dry.  Neurological:     Mental Status: She is alert and oriented to person, place, and time.     Deep Tendon Reflexes: Reflexes are normal and symmetric.  Psychiatric:        Behavior: Behavior normal.        Thought Content: Thought content normal.        Judgment: Judgment normal.     BP 130/70   Pulse 83   Temp (!) 97.5 F (36.4 C) (Oral)   Ht '5\' 5"'  (1.651 m)   Wt 285 lb (129.3 kg)   LMP 05/24/1986 (Approximate)   BMI 47.43 kg/m   hgba1c 7.6%     Assessment & Plan:  [name] comes in today with chief complaint of Medical Management of Chronic Issues   Diagnosis and orders  addressed:  1. Essential hypertension Low sodium diet - CMP14+EGFR - atenolol (TENORMIN) 100 MG tablet; Take 1 tablet (100 mg total) by mouth daily.  Dispense: 90 tablet; Refill: 1 - hydrochlorothiazide (HYDRODIURIL) 25 MG tablet; TAKE 1 TABLET (25 MG TOTAL) BY MOUTH DAILY.  Dispense: 90 tablet; Refill: 1  2. Mixed hyperlipidemia l pravastatin (PRAVACHOL) 40 MG tablet; TAKE 1 TABLET (40 MG TOTAL) BY MOUTH EVERY EVENING.  Dispense: 90 tablet; Refill: 1 Low fat diet - Lipid panel  3. Type 2 diabetes mellitus with hyperglycemia, with long-term current use of insulin (  [place]) Stricter carb counting - Bayer DCA Hb A1c Waived - Insulin Degludec (TRESIBA FLEXTOUCH) 200 UNIT/ML SOPN; Inject 70 Units into the skin daily.  Dispense: 27 mL; Refill: 1 - Dulaglutide (TRULICITY) 1.5 HU/8.3FG SOPN; Inject 1.5 mg into the skin once a week.  Dispense: 6 mL; Refill: 1  4. Hypothyroidism, unspecified type  5. GAD (generalized anxiety disorder) Stress management - clonazePAM (KLONOPIN) 0.5 MG tablet; Take 1 tablet (0.5 mg total) by mouth 2 (two) times daily as needed.  Dispense: 60 tablet; Refill: 3  6. Recurrent major depressive disorder, in full remission ([place]) - escitalopram (LEXAPRO) 20 MG tablet; Take 1 tablet (20 mg total) by mouth daily.  Dispense: 90 tablet; Refill: 1  7. Insomnia, unspecified type Bedtime routine  8. Gastroesophageal reflux disease without esophagitis Avoid spicy foods Do not eat 2 hours prior to bedtime - dexlansoprazole (DEXILANT) 60 MG capsule; TAKE 1 CAPSULE (60 MG TOTAL) BY MOUTH 2 (TWO) TIMES DAILY.  Dispense: 180 capsule; Refill: 1  9. OSA (obstructive sleep apnea) Continue to wear CPAP  10. Severe obesity (BMI >= 40) (HCC) Discussed diet and exercise for person with BMI >25 Will recheck weight in 3-6 months  11. Hypothyroidism due to medication - levothyroxine (SYNTHROID, LEVOTHROID) 175 MCG tablet; Take 1 tablet (175 mcg total) by mouth daily. Except Sunday.   Takes 200 mg tab on Sunday.  Dispense: 90 tablet; Refill: 1  12. Primary insomnia Bedtime routine - zolpidem (AMBIEN) 10 MG tablet; TAKE 1 TABLET BY MOUTH EVERY DAY AT BEDTIME AS NEEDED  Dispense: 30 tablet; Refill: 3   Labs pending Health Maintenance reviewed Diet and exercise encouraged  Follow up plan: 3 months   Mary-Margaret Hassell Done, FNP

## 2018-08-16 LAB — LIPID PANEL
Chol/HDL Ratio: 6 ratio — ABNORMAL HIGH (ref 0.0–4.4)
Cholesterol, Total: 174 mg/dL (ref 100–199)
HDL: 29 mg/dL — ABNORMAL LOW (ref >39)
LDL Calculated: 93 mg/dL (ref 0–99)
Triglycerides: 259 mg/dL — ABNORMAL HIGH (ref 0–149)
VLDL Cholesterol Cal: 52 mg/dL — ABNORMAL HIGH (ref 5–40)

## 2018-08-16 LAB — CMP14+EGFR
ALT: 16 IU/L (ref 0–32)
AST: 26 IU/L (ref 0–40)
Albumin/Globulin Ratio: 2.2 (ref 1.2–2.2)
Albumin: 4.1 g/dL (ref 3.8–4.8)
Alkaline Phosphatase: 84 IU/L (ref 39–117)
BUN/Creatinine Ratio: 16 (ref 12–28)
BUN: 14 mg/dL (ref 8–27)
Bilirubin Total: 0.3 mg/dL (ref 0.0–1.2)
CO2: 27 mmol/L (ref 20–29)
Calcium: 9.5 mg/dL (ref 8.7–10.3)
Chloride: 97 mmol/L (ref 96–106)
Creatinine, Ser: 0.87 mg/dL (ref 0.57–1.00)
GFR calc Af Amer: 82 mL/min/{1.73_m2} (ref >59)
GFR calc non Af Amer: 71 mL/min/{1.73_m2} (ref >59)
Globulin, Total: 1.9 g/dL (ref 1.5–4.5)
Glucose: 181 mg/dL — ABNORMAL HIGH (ref 65–99)
Potassium: 4.4 mmol/L (ref 3.5–5.2)
Sodium: 142 mmol/L (ref 134–144)
Total Protein: 6 g/dL (ref 6.0–8.5)

## 2018-08-17 ENCOUNTER — Ambulatory Visit: Payer: BLUE CROSS/BLUE SHIELD | Admitting: Nurse Practitioner

## 2018-09-08 ENCOUNTER — Encounter: Payer: Self-pay | Admitting: Physician Assistant

## 2018-09-08 ENCOUNTER — Other Ambulatory Visit: Payer: Self-pay | Admitting: Physician Assistant

## 2018-09-08 DIAGNOSIS — B354 Tinea corporis: Secondary | ICD-10-CM

## 2018-09-11 ENCOUNTER — Telehealth: Payer: BLUE CROSS/BLUE SHIELD | Admitting: Physician Assistant

## 2018-09-11 ENCOUNTER — Encounter: Payer: Self-pay | Admitting: Physician Assistant

## 2018-09-11 DIAGNOSIS — B370 Candidal stomatitis: Secondary | ICD-10-CM

## 2018-09-11 MED ORDER — FLUCONAZOLE 100 MG PO TABS: 100.0000 mg | ORAL_TABLET | Freq: Every day | ORAL | 0 refills | Status: DC

## 2018-09-11 NOTE — Progress Notes (Signed)
E Visit for Rash  We are sorry that you are not feeling well. Here is how we plan to help!   Based upon your presentation it appears you have a fungal infection (oral thrush) The preferred first line are topical treatment, however, since you taken the pills I will provide the fluconazole prescription.  I have prescribed: Fluconazole 100 mg daily for 7 days.    HOME CARE:   Take cool showers and avoid direct sunlight.  Apply cool compress or wet dressings.  Take a bath in an oatmeal bath.  Sprinkle content of one Aveeno packet under running faucet with comfortably warm water.  Bathe for 15-20 minutes, 1-2 times daily.  Pat dry with a towel. Do not rub the rash.  Use hydrocortisone cream.  Take an antihistamine like Benadryl for widespread rashes that itch.  The adult dose of Benadryl is 25-50 mg by mouth 4 times daily.  Caution:  This type of medication may cause sleepiness.  Do not drink alcohol, drive, or operate dangerous machinery while taking antihistamines.  Do not take these medications if you have prostate enlargement.  Read package instructions thoroughly on all medications that you take.  GET HELP RIGHT AWAY IF:   Symptoms don't go away after treatment.  Severe itching that persists.  If you rash spreads or swells.  If you rash begins to smell.  If it blisters and opens or develops a yellow-brown crust.  You develop a fever.  You have a sore throat.  You become short of breath.  MAKE SURE YOU:  Understand these instructions. Will watch your condition. Will get help right away if you are not doing well or get worse.  Thank you for choosing an e-visit. Your e-visit answers were reviewed by a board certified advanced clinical practitioner to complete your personal care plan. Depending upon the condition, your plan could have included both over the counter or prescription medications. Please review your pharmacy choice. Be sure that the pharmacy you have chosen  is open so that you can pick up your prescription now.  If there is a problem you may message your provider in [place] to have the prescription routed to another pharmacy. Your safety is important to [place]. If you have drug allergies check your prescription carefully.  For the next 24 hours, you can use MyChart to ask questions about today's visit, request a non-urgent call back, or ask for a work or school excuse from your e-visit provider. You will get an email in the next two days asking about your experience. I hope that your e-visit has been valuable and will speed your recovery.     I have spent 7 min in completion and review of this note- Lacy Duverney [facility]

## 2018-09-12 ENCOUNTER — Other Ambulatory Visit: Payer: Self-pay | Admitting: Nurse Practitioner

## 2018-09-12 MED ORDER — NYSTATIN 100000 UNIT/ML MT SUSP: 5.0000 mL | Freq: Four times a day (QID) | OROMUCOSAL | 0 refills | Status: DC

## 2018-09-12 NOTE — Progress Notes (Signed)
Nystatin liquid sent to pharmacy

## 2018-09-13 NOTE — Progress Notes (Signed)
Patient aware, per voice mail,  script is ready.

## 2018-10-01 ENCOUNTER — Other Ambulatory Visit: Payer: Self-pay | Admitting: Nurse Practitioner

## 2018-10-01 DIAGNOSIS — E1165 Type 2 diabetes mellitus with hyperglycemia: Secondary | ICD-10-CM

## 2018-10-24 NOTE — Progress Notes (Signed)
63 y.o. [id number] Married White or Caucasian Not Hispanic or Latino female here for annual exam. H/O hysterectomy. No bowel c/o.  Sexually active, no pain, dry but helped with lubrication.   She has had ~4 UTI's since February, managed by her primary. She is on 3 different diabetes medications. She has been having issues with thrush.    Multiple medical issues, including: diabetes, HTN, thyroid disease, sleep apnea, elevated lipids. Last HgbA1C was 7.6%    Patient's last menstrual period was 05/24/1986 (approximate).          Sexually active: Yes.    The current method of family planning is status post hysterectomy.    Exercising: Yes.    swimming, walking Smoker:  no  Health Maintenance: Pap:  06-24-14 WNL NEG HR HPV   History of abnormal Pap:  Yes, no surgery on her cervix MMG:  07/09/2018 Birads 1 negative Colonoscopy:  09-08-11 wnl, f/u 2023 BMD:   08-16-12 WNL with her primary  TDaP:  03/09/2011 Gardasil: N/A   reports that she has never smoked. She has never used smokeless tobacco. She reports that she does not drink alcohol or use drugs. 3 daughters, one died at 73 in an accident (over 27 years ago). 2 Other grown daughters, has 5 grand children  Past Medical History:  Diagnosis Date  . Abnormal Pap smear of cervix 1988   prior to hysterectomy  . Anxiety   . Arthritis 2015   spinal stenosis - Dr. Nelva Bush.  Epidural steroids  . Basal cell carcinoma of face 2008   Left  . Depression   . Diabetes mellitus without complication ([place])   . Exposure to TB    Treated x 6 months  . GERD (gastroesophageal reflux disease)   . Hiatal hernia   . Hyperlipidemia    diet controlled  . Hyperplastic colonic polyp 2003 & 2008   #3 polyps first, 1 second time  . Hypertension   . IBS (irritable bowel syndrome)   . Migraines   . OSA (obstructive sleep apnea) 07/25/2012   C- pap  . 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 Skin cancer    basal cell and 1 squamous cell on face and neck  .  Thyroid disease 2011   hyperthyroid and treated with radio active iodine - now hypothyroid  . Ulcer 1982   gastric    Past Surgical History:  Procedure Laterality Date  . COLONOSCOPY    . MOHS SURGERY  2006   left side of face  . POLYPECTOMY  colon  . THYROID SURGERY  2011   radioactive  . VAGINAL HYSTERECTOMY  1988   DUB and endometriosis    Current Outpatient Medications  Medication Sig Dispense Refill  . ACCU-CHEK FASTCLIX LANCETS MISC Use to chack blood sugar 2x a day and prn  Dx E11.9 300 each 1  . ACCU-CHEK GUIDE test strip CHECK BLOOD SUGAR TWICE DAILY AND AS NEEDED 200 each 3  . aspirin EC 81 MG tablet Take 1 tablet (81 mg total) by mouth daily.    Marland Kitchen atenolol (TENORMIN) 100 MG tablet Take 1 tablet (100 mg total) by mouth daily. 90 tablet 1  . B-D UF III MINI PEN NEEDLES 31G X 5 MM MISC USE AS DIRECTED WITH TRESIBA E11.9 100 each 11  . Calcium Carbonate-Vitamin D (CALCIUM 600 + D PO) Take 1 tablet by mouth daily.     . clonazePAM (KLONOPIN) 0.5 MG tablet Take 1 tablet (0.5 mg  total) by mouth 2 (two) times daily as needed. 60 tablet 3  . dexlansoprazole (DEXILANT) 60 MG capsule TAKE 1 CAPSULE (60 MG TOTAL) BY MOUTH 2 (TWO) TIMES DAILY. 180 capsule 1  . Dulaglutide (TRULICITY) 1.5 OZ/3.6UY SOPN Inject 1.5 mg into the skin once a week. 6 mL 1  . escitalopram (LEXAPRO) 20 MG tablet Take 1 tablet (20 mg total) by mouth daily. 90 tablet 1  . hydrochlorothiazide (HYDRODIURIL) 25 MG tablet TAKE 1 TABLET (25 MG TOTAL) BY MOUTH DAILY. 90 tablet 1  . levothyroxine (SYNTHROID, LEVOTHROID) 175 MCG tablet Take 1 tablet (175 mcg total) by mouth daily. Except Sunday.  Takes 200 mg tab on Sunday. 90 tablet 1  . loratadine (CLARITIN) 10 MG tablet Take 1 tablet (10 mg total) by mouth daily. (Patient taking differently: Take 10 mg by mouth daily as needed. ) 30 tablet 11  . pravastatin (PRAVACHOL) 40 MG tablet TAKE 1 TABLET (40 MG TOTAL) BY MOUTH EVERY EVENING. 90 tablet 1  . TRESIBA FLEXTOUCH  200 UNIT/ML SOPN INJECT 70 UNITS INTO THE SKIN DAILY. 27 mL 0  . zolpidem (AMBIEN) 10 MG tablet TAKE 1 TABLET BY MOUTH EVERY DAY AT BEDTIME AS NEEDED 30 tablet 3   No current facility-administered medications for this visit.     Family History  Adopted: Yes  Problem Relation Age of Onset  . Lung cancer Mother   . Lung cancer Maternal Aunt   . Stroke Maternal Grandfather   . Diabetes Maternal Grandfather   She isn't sure if her Aunt had lung cancer.   Review of Systems  Constitutional: Negative.   HENT: Negative.   Eyes: Negative.   Respiratory: Negative.   Cardiovascular: Negative.   Gastrointestinal: Negative.   Endocrine: Negative.   Genitourinary: Negative.   Musculoskeletal: Negative.   Skin: Negative.   Allergic/Immunologic: Negative.   Neurological: Negative.   Hematological: Negative.   Psychiatric/Behavioral: Negative.     Exam:   BP 132/76 (BP Location: Left Arm, Patient Position: Sitting, Cuff Size: Large)   Pulse 64   Temp 98.2 F (36.8 C) (Skin)   Ht 5' 4.75" (1.645 m)   Wt 283 lb (128.4 kg)   LMP 05/24/1986 (Approximate)   BMI 47.46 kg/m   Weight change: @WEIGHTCHANGE @ Height:   Height: 5' 4.75" (164.5 cm)  Ht Readings from Last 3 Encounters:  10/25/18 5' 4.75" (1.645 m)  08/15/18 5\' 5"  (1.651 m)  05/19/18 5\' 5"  (1.651 m)    General appearance: alert, cooperative and appears stated age Head: Normocephalic, without obvious abnormality, atraumatic Neck: no adenopathy, supple, symmetrical, trachea midline and thyroid normal to inspection and palpation Lungs: clear to auscultation bilaterally Cardiovascular: regular rate and rhythm Breasts: normal appearance, no masses or tenderness Abdomen: soft, non-tender; non distended,  no masses,  no organomegaly Extremities: extremities normal, atraumatic, no cyanosis or edema Skin: Skin color, texture, turgor normal. No rashes or lesions Lymph nodes: Cervical, supraclavicular, and axillary nodes normal. No  abnormal inguinal nodes palpated Neurologic: Grossly normal   Pelvic: External genitalia:  no lesions              Urethra:  normal appearing urethra with no masses, tenderness or lesions              Bartholins and Skenes: normal                 Vagina: normal appearing vagina with normal color and discharge, no lesions  Cervix: absent               Bimanual Exam:  Uterus:  uterus absent              Adnexa: no mass, fullness, tenderness               Rectovaginal: Confirms               Anus:  normal sphincter tone, no lesions  Chaperone was present for exam.  A:  Well Woman with normal exam  H/O hysterectomy  Multiple medical issues, managed by primary  P:   Labs with primary  Discussed breast self exam  Discussed calcium and vit D intake  Mammogram, colonoscopy, DEXA are up to date

## 2018-10-25 ENCOUNTER — Encounter: Payer: Self-pay | Admitting: Obstetrics and Gynecology

## 2018-10-25 ENCOUNTER — Ambulatory Visit (INDEPENDENT_AMBULATORY_CARE_PROVIDER_SITE_OTHER): Payer: BLUE CROSS/BLUE SHIELD | Admitting: Obstetrics and Gynecology

## 2018-10-25 ENCOUNTER — Other Ambulatory Visit: Payer: Self-pay

## 2018-10-25 VITALS — BP 132/76 | HR 64 | Temp 98.2°F | Ht 64.75 in | Wt 283.0 lb

## 2018-10-25 DIAGNOSIS — Z01419 Encounter for gynecological examination (general) (routine) without abnormal findings: Secondary | ICD-10-CM

## 2018-10-25 DIAGNOSIS — Z87440 Personal history of urinary (tract) infections: Secondary | ICD-10-CM

## 2018-10-25 LAB — POCT URINALYSIS DIPSTICK
Bilirubin, UA: NEGATIVE
Blood, UA: NEGATIVE
Glucose, UA: POSITIVE — AB
Ketones, UA: NEGATIVE
Nitrite, UA: NEGATIVE
Protein, UA: POSITIVE — AB
Spec Grav, UA: 1.01 (ref 1.010–1.025)
Urobilinogen, UA: 0.2 E.U./dL
pH, UA: 5 (ref 5.0–8.0)

## 2018-10-25 NOTE — Patient Instructions (Signed)

## 2018-10-30 ENCOUNTER — Other Ambulatory Visit: Payer: Self-pay | Admitting: Nurse Practitioner

## 2018-11-07 ENCOUNTER — Other Ambulatory Visit: Payer: Self-pay | Admitting: Nurse Practitioner

## 2018-11-13 ENCOUNTER — Other Ambulatory Visit: Payer: Self-pay | Admitting: Nurse Practitioner

## 2018-11-13 DIAGNOSIS — F5101 Primary insomnia: Secondary | ICD-10-CM

## 2018-11-14 ENCOUNTER — Telehealth: Payer: Self-pay

## 2018-11-14 NOTE — Telephone Encounter (Signed)
Patient has an appt this Thursday for follow up. Changed her over to a telephone visit. Patient is out of Ambien and wants to know if she can have a RF? Please review and advise

## 2018-11-16 ENCOUNTER — Encounter: Payer: Self-pay | Admitting: Nurse Practitioner

## 2018-11-16 ENCOUNTER — Ambulatory Visit: Payer: BLUE CROSS/BLUE SHIELD | Admitting: Nurse Practitioner

## 2018-11-16 ENCOUNTER — Other Ambulatory Visit: Payer: Self-pay

## 2018-11-16 ENCOUNTER — Ambulatory Visit (INDEPENDENT_AMBULATORY_CARE_PROVIDER_SITE_OTHER): Payer: BC Managed Care – PPO | Admitting: Nurse Practitioner

## 2018-11-16 DIAGNOSIS — E1165 Type 2 diabetes mellitus with hyperglycemia: Secondary | ICD-10-CM

## 2018-11-16 DIAGNOSIS — K219 Gastro-esophageal reflux disease without esophagitis: Secondary | ICD-10-CM

## 2018-11-16 DIAGNOSIS — G4700 Insomnia, unspecified: Secondary | ICD-10-CM

## 2018-11-16 DIAGNOSIS — E782 Mixed hyperlipidemia: Secondary | ICD-10-CM | POA: Diagnosis not present

## 2018-11-16 DIAGNOSIS — F411 Generalized anxiety disorder: Secondary | ICD-10-CM

## 2018-11-16 DIAGNOSIS — E039 Hypothyroidism, unspecified: Secondary | ICD-10-CM

## 2018-11-16 DIAGNOSIS — I10 Essential (primary) hypertension: Secondary | ICD-10-CM | POA: Diagnosis not present

## 2018-11-16 DIAGNOSIS — F3342 Major depressive disorder, recurrent, in full remission: Secondary | ICD-10-CM

## 2018-11-16 DIAGNOSIS — G4733 Obstructive sleep apnea (adult) (pediatric): Secondary | ICD-10-CM

## 2018-11-16 DIAGNOSIS — F5101 Primary insomnia: Secondary | ICD-10-CM

## 2018-11-16 DIAGNOSIS — Z794 Long term (current) use of insulin: Secondary | ICD-10-CM

## 2018-11-16 MED ORDER — ZOLPIDEM TARTRATE 10 MG PO TABS: ORAL_TABLET | ORAL | 3 refills | Status: DC

## 2018-11-16 MED ORDER — TRESIBA FLEXTOUCH 200 UNIT/ML ~~LOC~~ SOPN: 72.0000 [IU] | PEN_INJECTOR | Freq: Every day | SUBCUTANEOUS | 1 refills | Status: DC

## 2018-11-16 MED ORDER — TRULICITY 1.5 MG/0.5ML ~~LOC~~ SOAJ: 1.5000 mg | SUBCUTANEOUS | 1 refills | Status: DC

## 2018-11-16 NOTE — Progress Notes (Signed)
Virtual Visit via telephone Note  I connected with [name] on 11/16/18 at 10:40 by telephone and verified that I am speaking with the correct person using two identifiers. [name] is currently located at home and no one is currently with her during visit. The provider, Mary-Margaret Hassell Done, FNP is located in their office at time of visit.  I discussed the limitations, risks, security and privacy concerns of performing an evaluation and management service by telephone and the availability of in person appointments. I also discussed with the patient that there may be a patient responsible charge related to this service. The patient expressed understanding and agreed to proceed.   History and Present Illness:   Chief Complaint: Medical Management of Chronic Issues    HPI:  1. Essential hypertension No c/o chest pain, sob or headache. Does not check blood pressure at home. BP Readings from Last 3 Encounters:  10/25/18 132/76  08/15/18 130/70  05/19/18 139/74     2. Mixed hyperlipidemia Does not watch diet and does very little exercise.  3. Type 2 diabetes mellitus with hyperglycemia, with long-term current use of insulin (HCC) Last HGBA1C was 7.6%. blood sugars are running in 120-150. Was 137 this morning. No hypoglycemia  4. Hypothyroidism, unspecified type No problems that she is aware of  5. Gastroesophageal reflux disease without esophagitis Is on dexilant and is doing well  6. GAD (generalized anxiety disorder) Stress is some better. Takes klonopin as needed  7. Recurrent major depressive disorder, in full remission ([name]) Is on lexapro and is doing well. Depression screen [facility] 2/9 11/16/2018 08/15/2018 05/19/2018  Decreased Interest 1 0 0  Down, Depressed, Hopeless 1 0 0  PHQ - 2 Score 2 0 0     8. Insomnia, unspecified type Takes ambien at night and is working well.  9. OSA (obstructive sleep apnea) Wears CPAP nightly  10. Severe obesity  (BMI >= 40) (HCC) No recent weight changes    Outpatient Encounter Medications as of 11/16/2018  Medication Sig  . ACCU-CHEK FASTCLIX LANCETS MISC Use to chack blood sugar 2x a day and prn  Dx E11.9  . ACCU-CHEK GUIDE test strip CHECK BLOOD SUGAR TWICE DAILY AND AS NEEDED  . aspirin EC 81 MG tablet Take 1 tablet (81 mg total) by mouth daily.  Marland Kitchen atenolol (TENORMIN) 100 MG tablet Take 1 tablet (100 mg total) by mouth daily.  . B-D UF III MINI PEN NEEDLES 31G X 5 MM MISC USE AS DIRECTED WITH TRESIBA E11.9  . Calcium Carbonate-Vitamin D (CALCIUM 600 + D PO) Take 1 tablet by mouth daily.   . clonazePAM (KLONOPIN) 0.5 MG tablet Take 1 tablet (0.5 mg total) by mouth 2 (two) times daily as needed.  Marland Kitchen dexlansoprazole (DEXILANT) 60 MG capsule TAKE 1 CAPSULE (60 MG TOTAL) BY MOUTH 2 (TWO) TIMES DAILY.  . Dulaglutide (TRULICITY) 1.5 ZO/1.0RU SOPN Inject 1.5 mg into the skin once a week.  . escitalopram (LEXAPRO) 20 MG tablet Take 1 tablet (20 mg total) by mouth daily.  . hydrochlorothiazide (HYDRODIURIL) 25 MG tablet TAKE 1 TABLET (25 MG TOTAL) BY MOUTH DAILY.  Marland Kitchen levothyroxine (SYNTHROID) 200 MCG tablet TAKE ONE TABLET BY MOUTH ON SUNDAYS ONLY  . levothyroxine (SYNTHROID, LEVOTHROID) 175 MCG tablet Take 1 tablet (175 mcg total) by mouth daily. Except Sunday.  Takes 200 mg tab on Sunday.  . loratadine (CLARITIN) 10 MG tablet Take 1 tablet (10 mg total) by mouth daily. (Patient taking differently: Take 10 mg by  mouth daily as needed. )  . nystatin (MYCOSTATIN) 100000 UNIT/ML suspension TAKE 5 MLS (500,000 UNITS TOTAL) BY MOUTH 4 (FOUR) TIMES DAILY.  . pravastatin (PRAVACHOL) 40 MG tablet TAKE 1 TABLET (40 MG TOTAL) BY MOUTH EVERY EVENING.  . TRESIBA FLEXTOUCH 200 UNIT/ML SOPN INJECT 70 UNITS INTO THE SKIN DAILY.  Marland Kitchen zolpidem (AMBIEN) 10 MG tablet TAKE 1 TABLET BY MOUTH EVERY DAY AT BEDTIME AS NEEDED    Past Surgical History:  Procedure Laterality Date  . COLONOSCOPY    . MOHS SURGERY  2006   left side  of face  . POLYPECTOMY  colon  . THYROID SURGERY  2011   radioactive  . VAGINAL HYSTERECTOMY  1988   DUB and endometriosis    Family History  Adopted: Yes  Problem Relation Age of Onset  . Lung cancer Mother   . Lung cancer Maternal Aunt   . Stroke Maternal Grandfather   . Diabetes Maternal Grandfather     New complaints: None today  Social history: Her daughter is doing better which has helped her stress level     Review of Systems  Constitutional: Negative for diaphoresis and weight loss.  Eyes: Negative for blurred vision, double vision and pain.  Respiratory: Negative for shortness of breath.   Cardiovascular: Negative for chest pain, palpitations, orthopnea and leg swelling.  Gastrointestinal: Negative for abdominal pain.  Skin: Negative for rash.  Neurological: Negative for dizziness, sensory change, loss of consciousness, weakness and headaches.  Endo/Heme/Allergies: Negative for polydipsia. Does not bruise/bleed easily.  Psychiatric/Behavioral: Negative for memory loss. The patient does not have insomnia.   All other systems reviewed and are negative.    Observations/Objective: Alert and oriented- answers all questions appropriately No dstress  Assessment and Plan: [name] comes in today with chief complaint of Medical Management of Chronic Issues   Diagnosis and orders addressed:  1. Essential hypertension Low sodium diet  2. Mixed hyperlipidemia Low fat diet  3. Type 2 diabetes mellitus with hyperglycemia, with long-term current use of insulin (HCC) Strict caarb counting - Dulaglutide (TRULICITY) 1.5 KG/2.5KY SOPN; Inject 1.5 mg into the skin once a week.  Dispense: 6 mL; Refill: 1 - Insulin Degludec (TRESIBA FLEXTOUCH) 200 UNIT/ML SOPN; Inject 72 Units into the skin daily.  Dispense: 12 pen; Refill: 1  4. Hypothyroidism, unspecified type  5. Gastroesophageal reflux disease without esophagitis Avoid spicy foods Do not eat 2 hours  prior to bedtime  6. GAD (generalized anxiety disorder) Stress management  7. Recurrent major depressive disorder, in full remission ([name]) Stress manamgement  8.  Primary insomnia Bedtime routine - zolpidem (AMBIEN) 10 MG tablet; TAKE 1 TABLET BY MOUTH EVERY DAY AT BEDTIME AS NEEDED  Dispense: 30 tablet; Refill: 3   9. OSA (obstructive sleep apnea) Continue to wear CPAP nightly  10. Severe obesity (BMI >= 40) (HCC) Discussed diet and exercise for person with BMI >25 Will recheck weight in 3-6 months   Previous lab result sreviewed Health Maintenance reviewed Diet and exercise encouraged  Follow up plan: 3 months      I discussed the assessment and treatment plan with the patient. The patient was provided an opportunity to ask questions and all were answered. The patient agreed with the plan and demonstrated an understanding of the instructions.   The patient was advised to call back or seek an in-person evaluation if the symptoms worsen or if the condition fails to improve as anticipated.  The above assessment and management plan was discussed  with the patient. The patient verbalized understanding of and has agreed to the management plan. Patient is aware to call the clinic if symptoms persist or worsen. Patient is aware when to return to the clinic for a follow-up visit. Patient educated on when it is appropriate to go to the emergency department.   Time call ended:  11:01  I provided 21 minutes of non-face-to-face time during this encounter.    Mary-Margaret Hassell Done, FNP

## 2018-11-23 ENCOUNTER — Telehealth: Payer: Self-pay | Admitting: Nurse Practitioner

## 2018-11-27 LAB — HM DIABETES EYE EXAM

## 2018-11-27 NOTE — Telephone Encounter (Signed)
Release received and will be done 11-30-18

## 2018-12-01 ENCOUNTER — Ambulatory Visit: Payer: BLUE CROSS/BLUE SHIELD | Admitting: Nurse Practitioner

## 2018-12-25 ENCOUNTER — Telehealth: Payer: Self-pay | Admitting: Pulmonary Disease

## 2018-12-25 DIAGNOSIS — G4733 Obstructive sleep apnea (adult) (pediatric): Secondary | ICD-10-CM

## 2018-12-25 NOTE — Telephone Encounter (Signed)
LVM for patient to let her know her request was received and the order placed with [facility] and that she may be contacted by them regarding delivery or pick up required for the supplies. Nothing further needed at this time.

## 2019-01-08 ENCOUNTER — Other Ambulatory Visit: Payer: Self-pay | Admitting: Nurse Practitioner

## 2019-01-08 DIAGNOSIS — F411 Generalized anxiety disorder: Secondary | ICD-10-CM

## 2019-01-09 ENCOUNTER — Other Ambulatory Visit: Payer: Self-pay | Admitting: Nurse Practitioner

## 2019-01-09 DIAGNOSIS — F411 Generalized anxiety disorder: Secondary | ICD-10-CM

## 2019-01-09 MED ORDER — CLONAZEPAM 0.5 MG PO TABS: 0.5000 mg | ORAL_TABLET | Freq: Two times a day (BID) | ORAL | 3 refills | Status: DC | PRN

## 2019-01-16 ENCOUNTER — Other Ambulatory Visit: Payer: Self-pay | Admitting: Nurse Practitioner

## 2019-01-31 ENCOUNTER — Other Ambulatory Visit: Payer: Self-pay | Admitting: Nurse Practitioner

## 2019-02-01 ENCOUNTER — Other Ambulatory Visit: Payer: Self-pay | Admitting: Physician Assistant

## 2019-02-03 ENCOUNTER — Telehealth: Payer: BC Managed Care – PPO | Admitting: Family

## 2019-02-03 DIAGNOSIS — R300 Dysuria: Secondary | ICD-10-CM

## 2019-02-03 MED ORDER — SULFAMETHOXAZOLE-TRIMETHOPRIM 800-160 MG PO TABS: 1.0000 | ORAL_TABLET | Freq: Two times a day (BID) | ORAL | 0 refills | Status: DC

## 2019-02-03 NOTE — Progress Notes (Signed)
We are sorry that you are not feeling well.  Here is how we plan to help!  Based on what you shared with me it looks like you most likely have a simple urinary tract infection.  A UTI (Urinary Tract Infection) is a bacterial infection of the bladder.  Most cases of urinary tract infections are simple to treat but a key part of your care is to encourage you to drink plenty of fluids and watch your symptoms carefully.  I have prescribed Bactrim DS One tablet twice a day for 5 days.  Your symptoms should gradually improve. Call us if the burning in your urine worsens, you develop worsening fever, back pain or pelvic pain or if your symptoms do not resolve after completing the antibiotic.  Urinary tract infections can be prevented by drinking plenty of water to keep your body hydrated.  Also be sure when you wipe, wipe from front to back and don't hold it in!  If possible, empty your bladder every 4 hours.  Your e-visit answers were reviewed by a board certified advanced clinical practitioner to complete your personal care plan.  Depending on the condition, your plan could have included both over the counter or prescription medications.  If there is a problem please reply  once you have received a response from your provider.  Your safety is important to us.  If you have drug allergies check your prescription carefully.    You can use MyChart to ask questions about today's visit, request a non-urgent call back, or ask for a work or school excuse for 24 hours related to this e-Visit. If it has been greater than 24 hours you will need to follow up with your provider, or enter a new e-Visit to address those concerns.   You will get an e-mail in the next two days asking about your experience.  I hope that your e-visit has been valuable and will speed your recovery. Thank you for using e-visits.  Greater than 5 minutes, yet less than 10 minutes of time have been spent researching, coordinating, and  implementing care for this patient today.  Thank you for the details you included in the comment boxes. Those details are very helpful in determining the best course of treatment for you and help us to provide the best care.    

## 2019-02-09 ENCOUNTER — Other Ambulatory Visit: Payer: Self-pay | Admitting: Nurse Practitioner

## 2019-02-09 DIAGNOSIS — E032 Hypothyroidism due to medicaments and other exogenous substances: Secondary | ICD-10-CM

## 2019-02-16 ENCOUNTER — Ambulatory Visit: Payer: BC Managed Care – PPO | Admitting: Nurse Practitioner

## 2019-02-16 ENCOUNTER — Other Ambulatory Visit: Payer: Self-pay

## 2019-02-16 ENCOUNTER — Encounter: Payer: Self-pay | Admitting: Nurse Practitioner

## 2019-02-16 VITALS — BP 124/68 | HR 76 | Temp 98.0°F | Resp 20 | Ht 64.0 in | Wt 279.0 lb

## 2019-02-16 DIAGNOSIS — I10 Essential (primary) hypertension: Secondary | ICD-10-CM

## 2019-02-16 DIAGNOSIS — Z23 Encounter for immunization: Secondary | ICD-10-CM

## 2019-02-16 DIAGNOSIS — E782 Mixed hyperlipidemia: Secondary | ICD-10-CM

## 2019-02-16 DIAGNOSIS — E039 Hypothyroidism, unspecified: Secondary | ICD-10-CM

## 2019-02-16 DIAGNOSIS — K219 Gastro-esophageal reflux disease without esophagitis: Secondary | ICD-10-CM

## 2019-02-16 DIAGNOSIS — E032 Hypothyroidism due to medicaments and other exogenous substances: Secondary | ICD-10-CM

## 2019-02-16 DIAGNOSIS — F3342 Major depressive disorder, recurrent, in full remission: Secondary | ICD-10-CM

## 2019-02-16 DIAGNOSIS — E1165 Type 2 diabetes mellitus with hyperglycemia: Secondary | ICD-10-CM

## 2019-02-16 DIAGNOSIS — G629 Polyneuropathy, unspecified: Secondary | ICD-10-CM

## 2019-02-16 DIAGNOSIS — G4700 Insomnia, unspecified: Secondary | ICD-10-CM

## 2019-02-16 DIAGNOSIS — F5101 Primary insomnia: Secondary | ICD-10-CM

## 2019-02-16 DIAGNOSIS — Z794 Long term (current) use of insulin: Secondary | ICD-10-CM

## 2019-02-16 DIAGNOSIS — G4733 Obstructive sleep apnea (adult) (pediatric): Secondary | ICD-10-CM

## 2019-02-16 DIAGNOSIS — F411 Generalized anxiety disorder: Secondary | ICD-10-CM

## 2019-02-16 LAB — CMP14+EGFR
ALT: 20 IU/L (ref 0–32)
AST: 36 IU/L (ref 0–40)
Albumin/Globulin Ratio: 2.2 (ref 1.2–2.2)
Albumin: 3.8 g/dL (ref 3.8–4.8)
Alkaline Phosphatase: 86 IU/L (ref 39–117)
BUN/Creatinine Ratio: 18 (ref 12–28)
BUN: 16 mg/dL (ref 8–27)
Bilirubin Total: 0.4 mg/dL (ref 0.0–1.2)
CO2: 25 mmol/L (ref 20–29)
Calcium: 9.3 mg/dL (ref 8.7–10.3)
Chloride: 98 mmol/L (ref 96–106)
Creatinine, Ser: 0.88 mg/dL (ref 0.57–1.00)
GFR calc Af Amer: 80 mL/min/{1.73_m2} (ref >59)
GFR calc non Af Amer: 70 mL/min/{1.73_m2} (ref >59)
Globulin, Total: 1.7 g/dL (ref 1.5–4.5)
Glucose: 228 mg/dL — ABNORMAL HIGH (ref 65–99)
Potassium: 3.9 mmol/L (ref 3.5–5.2)
Sodium: 138 mmol/L (ref 134–144)
Total Protein: 5.5 g/dL — ABNORMAL LOW (ref 6.0–8.5)

## 2019-02-16 LAB — LIPID PANEL
Chol/HDL Ratio: 5.8 ratio — ABNORMAL HIGH (ref 0.0–4.4)
Cholesterol, Total: 156 mg/dL (ref 100–199)
HDL: 27 mg/dL — ABNORMAL LOW (ref >39)
LDL Chol Calc (NIH): 91 mg/dL (ref 0–99)
Triglycerides: 221 mg/dL — ABNORMAL HIGH (ref 0–149)
VLDL Cholesterol Cal: 38 mg/dL (ref 5–40)

## 2019-02-16 LAB — BAYER DCA HB A1C WAIVED: HB A1C (BAYER DCA - WAIVED): 8.4 % — ABNORMAL HIGH (ref <7.0)

## 2019-02-16 MED ORDER — GABAPENTIN 300 MG PO CAPS: 300.0000 mg | ORAL_CAPSULE | Freq: Every day | ORAL | 1 refills | Status: DC

## 2019-02-16 MED ORDER — CLONAZEPAM 0.5 MG PO TABS: 0.5000 mg | ORAL_TABLET | Freq: Two times a day (BID) | ORAL | 3 refills | Status: DC | PRN

## 2019-02-16 MED ORDER — TRESIBA FLEXTOUCH 200 UNIT/ML ~~LOC~~ SOPN: 72.0000 [IU] | PEN_INJECTOR | Freq: Every day | SUBCUTANEOUS | 1 refills | Status: DC

## 2019-02-16 MED ORDER — ZOLPIDEM TARTRATE 10 MG PO TABS: ORAL_TABLET | ORAL | 3 refills | Status: DC

## 2019-02-16 MED ORDER — ESCITALOPRAM OXALATE 20 MG PO TABS: 20.0000 mg | ORAL_TABLET | Freq: Every day | ORAL | 1 refills | Status: DC

## 2019-02-16 MED ORDER — TRULICITY 1.5 MG/0.5ML ~~LOC~~ SOAJ: 1.5000 mg | SUBCUTANEOUS | 1 refills | Status: DC

## 2019-02-16 MED ORDER — HYDROCHLOROTHIAZIDE 25 MG PO TABS: ORAL_TABLET | ORAL | 1 refills | Status: DC

## 2019-02-16 MED ORDER — ATENOLOL 100 MG PO TABS: 100.0000 mg | ORAL_TABLET | Freq: Every day | ORAL | 1 refills | Status: DC

## 2019-02-16 MED ORDER — TRESIBA FLEXTOUCH 200 UNIT/ML ~~LOC~~ SOPN: 80.0000 [IU] | PEN_INJECTOR | Freq: Every day | SUBCUTANEOUS | 1 refills | Status: DC

## 2019-02-16 MED ORDER — DEXILANT 60 MG PO CPDR: DELAYED_RELEASE_CAPSULE | ORAL | 1 refills | Status: DC

## 2019-02-16 MED ORDER — LEVOTHYROXINE SODIUM 175 MCG PO TABS: 175.0000 ug | ORAL_TABLET | Freq: Every day | ORAL | 1 refills | Status: DC

## 2019-02-16 MED ORDER — PRAVASTATIN SODIUM 40 MG PO TABS: ORAL_TABLET | ORAL | 1 refills | Status: DC

## 2019-02-16 NOTE — Patient Instructions (Signed)

## 2019-02-16 NOTE — Addendum Note (Signed)
Addended by: Chevis Pretty on: 02/16/2019 12:06 PM   Modules accepted: Orders

## 2019-02-16 NOTE — Progress Notes (Addendum)
Subjective:    Patient ID: [name], female    DOB: 17-Dec-1955, 63 y.o.   MRN: [id number]   Chief Complaint: Medical Management of Chronic Issues    HPI:  1. Essential hypertension No c/o chest pain, sob or headache. Does not check blood pressure at home. BP Readings from Last 3 Encounters:  10/25/18 132/76  08/15/18 130/70  05/19/18 139/74     2. Mixed hyperlipidemia Does not watch diet and does no exercise. Lab Results  Component Value Date   CHOL 174 08/15/2018   HDL 29 (L) 08/15/2018   LDLCALC 93 08/15/2018   LDLDIRECT 113 01/30/2015   TRIG 259 (H) 08/15/2018   CHOLHDL 6.0 (H) 08/15/2018     3. Type 2 diabetes mellitus with hyperglycemia, with long-term current use of insulin (HCC) Her blood sugars are all over the place. Her last visit was a televisit so labs were not done. Lab Results  Component Value Date   HGBA1C 7.6 (H) 08/15/2018    4. Hypothyroidism, unspecified type No problems that she is aware of  5. Recurrent major depressive disorder, in full remission ([place]) Is on lexapro daily and is doing well. Depression screen [facility] 2/9 02/16/2019 11/16/2018 08/15/2018  Decreased Interest 1 1 0  Down, Depressed, Hopeless 2 1 0  PHQ - 2 Score 3 2 0  Altered sleeping 0 - -  Tired, decreased energy 1 - -  Change in appetite 0 - -  Feeling bad or failure about yourself  0 - -  Trouble concentrating 0 - -  Moving slowly or fidgety/restless 0 - -  Suicidal thoughts 0 - -  PHQ-9 Score 4 - -    6. GAD (generalized anxiety disorder) Takes klonopin BID. She is a very anxious person and needs meds to stay calm. GAD 7 : Generalized Anxiety Score 02/16/2019  Nervous, Anxious, on Edge 2  Control/stop worrying 2  Worry too much - different things 2  Trouble relaxing 0  Restless 0  Easily annoyed or irritable 0  Afraid - awful might happen 0  Total GAD 7 Score 6     7. Gastroesophageal reflux disease without esophagitis Is on daily dose of dexilant and  she says that is working well for her  8. OSA (obstructive sleep apnea) She wears her CPAP nightly. Says she does not sleep well without it.  9. Insomnia, unspecified type Takes ambien to sleep at night. Says she feels rested in the  morning  10. Severe obesity (BMI >= 40) (HCC) No recent weight changes , Wt Readings from Last 3 Encounters:  02/16/19 279 lb (126.6 kg)  10/25/18 283 lb (128.4 kg)  08/15/18 285 lb (129.3 kg)   BMI Readings from Last 3 Encounters:  02/16/19 47.89 kg/m  10/25/18 47.46 kg/m  08/15/18 47.43 kg/m       Outpatient Encounter Medications as of 02/16/2019  Medication Sig  . ACCU-CHEK FASTCLIX LANCETS MISC Use to chack blood sugar 2x a day and prn  Dx E11.9  . ACCU-CHEK GUIDE test strip CHECK BLOOD SUGAR TWICE DAILY AND AS NEEDED  . aspirin EC 81 MG tablet Take 1 tablet (81 mg total) by mouth daily.  Marland Kitchen atenolol (TENORMIN) 100 MG tablet Take 1 tablet (100 mg total) by mouth daily.  . B-D UF III MINI PEN NEEDLES 31G X 5 MM MISC USE AS DIRECTED WITH TRESIBA E11.9  . Calcium Carbonate-Vitamin D (CALCIUM 600 + D PO) Take 1 tablet by mouth daily.   Marland Kitchen  clonazePAM (KLONOPIN) 0.5 MG tablet Take 1 tablet (0.5 mg total) by mouth 2 (two) times daily as needed.  Marland Kitchen dexlansoprazole (DEXILANT) 60 MG capsule TAKE 1 CAPSULE (60 MG TOTAL) BY MOUTH 2 (TWO) TIMES DAILY.  . Dulaglutide (TRULICITY) 1.5 CZ/6.6AY SOPN Inject 1.5 mg into the skin once a week.  . escitalopram (LEXAPRO) 20 MG tablet Take 1 tablet (20 mg total) by mouth daily.  . hydrochlorothiazide (HYDRODIURIL) 25 MG tablet TAKE 1 TABLET (25 MG TOTAL) BY MOUTH DAILY.  Marland Kitchen Insulin Degludec (TRESIBA FLEXTOUCH) 200 UNIT/ML SOPN Inject 72 Units into the skin daily.  Marland Kitchen levothyroxine (SYNTHROID) 175 MCG tablet TAKE 1 TABLET (175 MCG TOTAL) BY MOUTH DAILY. EXCEPT SUNDAY. TAKES 200 MG TAB ON SUNDAY.  Marland Kitchen levothyroxine (SYNTHROID) 200 MCG tablet TAKE ONE TABLET BY MOUTH ON SUNDAYS ONLY  . loratadine (CLARITIN) 10 MG tablet  Take 1 tablet (10 mg total) by mouth daily. (Patient taking differently: Take 10 mg by mouth daily as needed. )  . nystatin (MYCOSTATIN) 100000 UNIT/ML suspension TAKE 5 MLS (500,000 UNITS TOTAL) BY MOUTH 4 (FOUR) TIMES DAILY.  . pravastatin (PRAVACHOL) 40 MG tablet TAKE 1 TABLET (40 MG TOTAL) BY MOUTH EVERY EVENING.  Marland Kitchen sulfamethoxazole-trimethoprim (BACTRIM DS) 800-160 MG tablet Take 1 tablet by mouth 2 (two) times daily.  Marland Kitchen zolpidem (AMBIEN) 10 MG tablet TAKE 1 TABLET BY MOUTH EVERY DAY AT BEDTIME AS NEEDED     Past Surgical History:  Procedure Laterality Date  . COLONOSCOPY    . MOHS SURGERY  2006   left side of face  . POLYPECTOMY  colon  . THYROID SURGERY  2011   radioactive  . VAGINAL HYSTERECTOMY  1988   DUB and endometriosis    Family History  Adopted: Yes  Problem Relation Age of Onset  . Lung cancer Mother   . Lung cancer Maternal Aunt   . Stroke Maternal Grandfather   . Diabetes Maternal Grandfather     New complaints: Burning of both feet. Seems to be worse at night  Social history: Lives with her husband. They are bith retired. Sh ehas lots of stresin herlife from her daughter.  Controlled substance contract: 02/16/19    Review of Systems  Constitutional: Negative for activity change and appetite change.  HENT: Negative.   Eyes: Negative for pain.  Respiratory: Negative for shortness of breath.   Cardiovascular: Negative for chest pain, palpitations and leg swelling.  Gastrointestinal: Negative for abdominal pain.  Endocrine: Negative for polydipsia.  Genitourinary: Negative.   Skin: Negative for rash.  Neurological: Negative for dizziness, weakness and headaches.  Hematological: Does not bruise/bleed easily.  Psychiatric/Behavioral: Negative.   All other systems reviewed and are negative.      Objective:   Physical Exam Vitals signs and nursing note reviewed.  Constitutional:      General: She is not in acute distress.    Appearance: Normal  appearance. She is well-developed.  HENT:     Head: Normocephalic.     Nose: Nose normal.  Eyes:     Pupils: Pupils are equal, round, and reactive to light.  Neck:     Musculoskeletal: Normal range of motion and neck supple.     Vascular: No carotid bruit or JVD.  Cardiovascular:     Rate and Rhythm: Normal rate and regular rhythm.     Heart sounds: Normal heart sounds.  Pulmonary:     Effort: Pulmonary effort is normal. No respiratory distress.     Breath sounds: Normal breath  sounds. No wheezing or rales.  Chest:     Chest wall: No tenderness.  Abdominal:     General: Bowel sounds are normal. There is no distension or abdominal bruit.     Palpations: Abdomen is soft. There is no hepatomegaly, splenomegaly, mass or pulsatile mass.     Tenderness: There is no abdominal tenderness.  Musculoskeletal: Normal range of motion.  Lymphadenopathy:     Cervical: No cervical adenopathy.  Skin:    General: Skin is warm and dry.  Neurological:     Mental Status: She is alert and oriented to person, place, and time.     Deep Tendon Reflexes: Reflexes are normal and symmetric.  Psychiatric:        Behavior: Behavior normal.        Thought Content: Thought content normal.        Judgment: Judgment normal.     BP 124/68   Pulse 76   Temp 98 F (36.7 C) (Oral)   Resp 20   Ht _0  (1.626 m)   Wt 279 lb (126.6 kg)   LMP 05/24/1986 (Approximate)   SpO2 97%   BMI 47.89 kg/m   hgba1c 8.4%      Assessment & Plan:  [name] comes in today with chief complaint of Medical Management of Chronic Issues   Diagnosis and orders addressed:  1. Essential hypertension Low sodium diet - CMP14+EGFR - atenolol (TENORMIN) 100 MG tablet; Take 1 tablet (100 mg total) by mouth daily.  Dispense: 90 tablet; Refill: 1 - hydrochlorothiazide (HYDRODIURIL) 25 MG tablet; TAKE 1 TABLET (25 MG TOTAL) BY MOUTH DAILY.  Dispense: 90 tablet; Refill: 1  2. Mixed hyperlipidemia Low fat diet -  Lipid panel - pravastatin (PRAVACHOL) 40 MG tablet; TAKE 1 TABLET (40 MG TOTAL) BY MOUTH EVERY EVENING.  Dispense: 90 tablet; Refill: 1  3. Type 2 diabetes mellitus with hyperglycemia, with long-term current use of insulin (HCC) Stricter carb counting Increase tresiba to 80u ( 76u for 3 days the 80U ) - Bayer DCA Hb A1c Waived - Microalbumin / creatinine urine ratio - Dulaglutide (TRULICITY) 1.5 DU/2.0UR SOPN; Inject 1.5 mg into the skin once a week.  Dispense: 6 mL; Refill: 1 - Insulin Degludec (TRESIBA FLEXTOUCH) 200 UNIT/ML SOPN; Inject 80 Units into the skin daily.  Dispense: 12 pen; Refill: 1  4. Hypothyroidism, unspecified type Labs pending  5. Recurrent major depressive disorder, in full remission ([place]) Stress management - escitalopram (LEXAPRO) 20 MG tablet; Take 1 tablet (20 mg total) by mouth daily.  Dispense: 90 tablet; Refill: 1  6. GAD (generalized anxiety disorder) - clonazePAM (KLONOPIN) 0.5 MG tablet; Take 1 tablet (0.5 mg total) by mouth 2 (two) times daily as needed.  Dispense: 60 tablet; Refill: 3  7. Gastroesophageal reflux disease without esophagitis Avoid spicy foods Do not eat 2 hours prior to bedtime - dexlansoprazole (DEXILANT) 60 MG capsule; TAKE 1 CAPSULE (60 MG TOTAL) BY MOUTH 2 (TWO) TIMES DAILY.  Dispense: 180 capsule; Refill: 1  8. OSA (obstructive sleep apnea) CPAP nighty  9. Insomnia, unspecified type Bedtime routine  10. Severe obesity (BMI >= 40) (HCC) Discussed diet and exercise for person with BMI >25 Will recheck weight in 3-6 months  11. Hypothyroidism due to medication - levothyroxine (SYNTHROID) 175 MCG tablet; Take 1 tablet (175 mcg total) by mouth daily. Except Sunday.  Takes 200 mg tab on Sunday.  Dispense: 90 tablet; Refill: 1  12. Primary insomnia Bedtime routine - zolpidem (AMBIEN) 10  MG tablet; TAKE 1 TABLET BY MOUTH EVERY DAY AT BEDTIME AS NEEDED  Dispense: 30 tablet; Refill: 3  13. neuropathy bil feet Gabapentin 338m qhs   Labs pending Health Maintenance reviewed Diet and exercise encouraged  Follow up plan: 3 months   Mary-Margaret MHassell Done FNP

## 2019-02-17 LAB — MICROALBUMIN / CREATININE URINE RATIO
Creatinine, Urine: 71.6 mg/dL
Microalb/Creat Ratio: 117 mg/g creat — ABNORMAL HIGH (ref 0–29)
Microalbumin, Urine: 83.7 ug/mL

## 2019-02-21 LAB — THYROID PANEL WITH TSH
Free Thyroxine Index: 2.7 (ref 1.2–4.9)
T3 Uptake Ratio: 24 % (ref 24–39)
T4, Total: 11.2 ug/dL (ref 4.5–12.0)
TSH: 3.08 u[IU]/mL (ref 0.450–4.500)

## 2019-02-21 LAB — SPECIMEN STATUS REPORT

## 2019-02-26 ENCOUNTER — Other Ambulatory Visit: Payer: Self-pay | Admitting: Nurse Practitioner

## 2019-02-26 DIAGNOSIS — E1165 Type 2 diabetes mellitus with hyperglycemia: Secondary | ICD-10-CM

## 2019-03-05 ENCOUNTER — Other Ambulatory Visit: Payer: Self-pay | Admitting: Nurse Practitioner

## 2019-04-05 ENCOUNTER — Other Ambulatory Visit: Payer: Self-pay | Admitting: Nurse Practitioner

## 2019-04-05 DIAGNOSIS — Z794 Long term (current) use of insulin: Secondary | ICD-10-CM

## 2019-04-05 DIAGNOSIS — E1165 Type 2 diabetes mellitus with hyperglycemia: Secondary | ICD-10-CM

## 2019-05-22 ENCOUNTER — Ambulatory Visit: Payer: BC Managed Care – PPO | Admitting: Nurse Practitioner

## 2019-05-22 ENCOUNTER — Other Ambulatory Visit: Payer: Self-pay

## 2019-05-22 ENCOUNTER — Encounter: Payer: Self-pay | Admitting: Nurse Practitioner

## 2019-05-22 VITALS — BP 128/71 | HR 69 | Temp 98.7°F | Resp 20 | Ht 64.0 in | Wt 269.0 lb

## 2019-05-22 DIAGNOSIS — F3342 Major depressive disorder, recurrent, in full remission: Secondary | ICD-10-CM

## 2019-05-22 DIAGNOSIS — F411 Generalized anxiety disorder: Secondary | ICD-10-CM

## 2019-05-22 DIAGNOSIS — E1165 Type 2 diabetes mellitus with hyperglycemia: Secondary | ICD-10-CM

## 2019-05-22 DIAGNOSIS — E039 Hypothyroidism, unspecified: Secondary | ICD-10-CM

## 2019-05-22 DIAGNOSIS — G629 Polyneuropathy, unspecified: Secondary | ICD-10-CM

## 2019-05-22 DIAGNOSIS — G4700 Insomnia, unspecified: Secondary | ICD-10-CM

## 2019-05-22 DIAGNOSIS — E782 Mixed hyperlipidemia: Secondary | ICD-10-CM

## 2019-05-22 DIAGNOSIS — I10 Essential (primary) hypertension: Secondary | ICD-10-CM

## 2019-05-22 DIAGNOSIS — Z794 Long term (current) use of insulin: Secondary | ICD-10-CM

## 2019-05-22 DIAGNOSIS — F5101 Primary insomnia: Secondary | ICD-10-CM

## 2019-05-22 DIAGNOSIS — G4733 Obstructive sleep apnea (adult) (pediatric): Secondary | ICD-10-CM

## 2019-05-22 DIAGNOSIS — K219 Gastro-esophageal reflux disease without esophagitis: Secondary | ICD-10-CM

## 2019-05-22 DIAGNOSIS — E032 Hypothyroidism due to medicaments and other exogenous substances: Secondary | ICD-10-CM

## 2019-05-22 LAB — BAYER DCA HB A1C WAIVED: HB A1C (BAYER DCA - WAIVED): 8.7 % — ABNORMAL HIGH (ref <7.0)

## 2019-05-22 MED ORDER — TRULICITY 1.5 MG/0.5ML ~~LOC~~ SOAJ: 1.5000 mg | SUBCUTANEOUS | 1 refills | Status: AC

## 2019-05-22 MED ORDER — CLONAZEPAM 0.5 MG PO TABS: 0.5000 mg | ORAL_TABLET | Freq: Two times a day (BID) | ORAL | 3 refills | Status: AC | PRN

## 2019-05-22 MED ORDER — DEXILANT 60 MG PO CPDR: DELAYED_RELEASE_CAPSULE | ORAL | 1 refills | Status: AC

## 2019-05-22 MED ORDER — TRESIBA FLEXTOUCH 200 UNIT/ML ~~LOC~~ SOPN: 90.0000 [IU] | PEN_INJECTOR | Freq: Every day | SUBCUTANEOUS | 1 refills | Status: DC

## 2019-05-22 MED ORDER — ZOLPIDEM TARTRATE 10 MG PO TABS: ORAL_TABLET | ORAL | 3 refills | Status: AC

## 2019-05-22 MED ORDER — HYDROCHLOROTHIAZIDE 25 MG PO TABS: ORAL_TABLET | ORAL | 1 refills | Status: AC

## 2019-05-22 MED ORDER — ATENOLOL 100 MG PO TABS: 100.0000 mg | ORAL_TABLET | Freq: Every day | ORAL | 1 refills | Status: AC

## 2019-05-22 MED ORDER — BD PEN NEEDLE MINI U/F 31G X 5 MM MISC: 3 refills | Status: AC

## 2019-05-22 MED ORDER — TRESIBA FLEXTOUCH 200 UNIT/ML ~~LOC~~ SOPN: 80.0000 [IU] | PEN_INJECTOR | Freq: Every day | SUBCUTANEOUS | 1 refills | Status: DC

## 2019-05-22 MED ORDER — PRAVASTATIN SODIUM 40 MG PO TABS: ORAL_TABLET | ORAL | 1 refills | Status: AC

## 2019-05-22 MED ORDER — LEVOTHYROXINE SODIUM 175 MCG PO TABS: 175.0000 ug | ORAL_TABLET | Freq: Every day | ORAL | 1 refills | Status: AC

## 2019-05-22 MED ORDER — ESCITALOPRAM OXALATE 20 MG PO TABS: 20.0000 mg | ORAL_TABLET | Freq: Every day | ORAL | 1 refills | Status: AC

## 2019-05-22 MED ORDER — GABAPENTIN 300 MG PO CAPS: 300.0000 mg | ORAL_CAPSULE | Freq: Every day | ORAL | 1 refills | Status: AC

## 2019-05-22 NOTE — Patient Instructions (Signed)

## 2019-05-22 NOTE — Progress Notes (Signed)
Subjective:    Patient ID: [name], female    DOB: July 18, 1955, 63 y.o.   MRN: [id number]   Chief Complaint: Medical Management of Chronic Issues    HPI:  1. Essential hypertension No c/o chest pain, sob or headache. Does not check blood pressure at home BP Readings from Last 3 Encounters:  05/22/19 128/71  02/16/19 124/68  10/25/18 132/76     2. Mixed hyperlipidemia Does not watch diet and does no exercise. Lab Results  Component Value Date   CHOL 156 02/16/2019   HDL 27 (L) 02/16/2019   LDLCALC 91 02/16/2019   LDLDIRECT 113 01/30/2015   TRIG 221 (H) 02/16/2019   CHOLHDL 5.8 (H) 02/16/2019     3. Type 2 diabetes mellitus with hyperglycemia, with long-term current use of insulin (HCC) Blood sugars have been running high. She has not been watching diet due to stress. Lab Results  Component Value Date   HGBA1C 8.4 (H) 02/16/2019     4. Hypothyroidism, unspecified type No problems that aware of  5. Gastroesophageal reflux disease without esophagitis Is on dexilant daily and is doing well on medication.  6. GAD (generalized anxiety disorder) Is on clonopin BID. GAD 7 : Generalized Anxiety Score 05/22/2019 02/16/2019  Nervous, Anxious, on Edge 3 2  Control/stop worrying 3 2  Worry too much - different things 3 2  Trouble relaxing 3 0  Restless 1 0  Easily annoyed or irritable 0 0  Afraid - awful might happen 0 0  Total GAD 7 Score 13 6  Anxiety Difficulty Not difficult at all -      7. Recurrent major depressive disorder, in full remission ([place]) Is on lexapro daily and that helps keep her going. Depression screen [facility] 2/9 05/22/2019 02/16/2019 11/16/2018  Decreased Interest 0 1 1  Down, Depressed, Hopeless 0 2 1  PHQ - 2 Score 0 3 2  Altered sleeping - 0 -  Tired, decreased energy - 1 -  Change in appetite - 0 -  Feeling bad or failure about yourself  - 0 -  Trouble concentrating - 0 -  Moving slowly or fidgety/restless - 0 -  Suicidal  thoughts - 0 -  PHQ-9 Score - 4 -     8. Insomnia, unspecified type Has to take ambien nightly in order to sleep  9. OSA (obstructive sleep apnea) Wears CPAP nightly- ususlaly feels rested in mornings  10. Severe obesity (BMI >= 40) (HCC) Weight down 10 lbs Wt Readings from Last 3 Encounters:  05/22/19 269 lb (122 kg)  02/16/19 279 lb (126.6 kg)  10/25/18 283 lb (128.4 kg)   BMI Readings from Last 3 Encounters:  05/22/19 46.17 kg/m  02/16/19 47.89 kg/m  10/25/18 47.46 kg/m       Outpatient Encounter Medications as of 05/22/2019  Medication Sig  . ACCU-CHEK FASTCLIX LANCETS MISC Use to chack blood sugar 2x a day and prn  Dx E11.9  . ACCU-CHEK GUIDE test strip CHECK BLOOD SUGAR TWICE DAILY AND AS NEEDED  . aspirin EC 81 MG tablet Take 1 tablet (81 mg total) by mouth daily.  Marland Kitchen atenolol (TENORMIN) 100 MG tablet Take 1 tablet (100 mg total) by mouth daily.  . B-D UF III MINI PEN NEEDLES 31G X 5 MM MISC USE AS DIRECTED WITH TRESIBA ONCE A DAY ** E11.9  . Calcium Carbonate-Vitamin D (CALCIUM 600 + D PO) Take 1 tablet by mouth daily.   . clonazePAM (KLONOPIN) 0.5 MG tablet Take 1  tablet (0.5 mg total) by mouth 2 (two) times daily as needed.  Marland Kitchen dexlansoprazole (DEXILANT) 60 MG capsule TAKE 1 CAPSULE (60 MG TOTAL) BY MOUTH 2 (TWO) TIMES DAILY.  . Dulaglutide (TRULICITY) 1.5 WE/3.1VQ SOPN Inject 1.5 mg into the skin once a week.  . escitalopram (LEXAPRO) 20 MG tablet Take 1 tablet (20 mg total) by mouth daily.  Marland Kitchen gabapentin (NEURONTIN) 300 MG capsule Take 1 capsule (300 mg total) by mouth at bedtime.  . hydrochlorothiazide (HYDRODIURIL) 25 MG tablet TAKE 1 TABLET (25 MG TOTAL) BY MOUTH DAILY.  Marland Kitchen Insulin Degludec (TRESIBA FLEXTOUCH) 200 UNIT/ML SOPN Inject 80 Units into the skin daily.  Marland Kitchen levothyroxine (SYNTHROID) 175 MCG tablet Take 1 tablet (175 mcg total) by mouth daily. Except Sunday.  Takes 200 mg tab on Sunday.  . nystatin (MYCOSTATIN) 100000 UNIT/ML suspension TAKE 5 MLS  (500,000 UNITS TOTAL) BY MOUTH 4 (FOUR) TIMES DAILY.  . pravastatin (PRAVACHOL) 40 MG tablet TAKE 1 TABLET (40 MG TOTAL) BY MOUTH EVERY EVENING.  Marland Kitchen zolpidem (AMBIEN) 10 MG tablet TAKE 1 TABLET BY MOUTH EVERY DAY AT BEDTIME AS NEEDED    Past Surgical History:  Procedure Laterality Date  . COLONOSCOPY    . CYST REMOVAL TRUNK  2020  . MOHS SURGERY  2006   left side of face  . POLYPECTOMY  colon  . THYROID SURGERY  2011   radioactive  . VAGINAL HYSTERECTOMY  1988   DUB and endometriosis    Family History  Adopted: Yes  Problem Relation Age of Onset  . Lung cancer Mother   . Lung cancer Maternal Aunt   . Stroke Maternal Grandfather   . Diabetes Maternal Grandfather     New complaints: None today  Social history: Daughter in hospital in [place] needing a liver transplant  Controlled substance contract: 03/14/19     Review of Systems  Constitutional: Negative for diaphoresis.  Eyes: Negative for pain.  Respiratory: Negative for shortness of breath.   Cardiovascular: Negative for chest pain, palpitations and leg swelling.  Gastrointestinal: Negative for abdominal pain.  Endocrine: Negative for polydipsia.  Skin: Negative for rash.  Neurological: Negative for dizziness, weakness and headaches.  Hematological: Does not bruise/bleed easily.  All other systems reviewed and are negative.      Objective:   Physical Exam Vitals and nursing note reviewed.  Constitutional:      General: She is not in acute distress.    Appearance: Normal appearance. She is well-developed.  HENT:     Head: Normocephalic.     Nose: Nose normal.  Eyes:     Pupils: Pupils are equal, round, and reactive to light.  Neck:     Vascular: No carotid bruit or JVD.  Cardiovascular:     Rate and Rhythm: Normal rate and regular rhythm.     Heart sounds: Normal heart sounds.  Pulmonary:     Effort: Pulmonary effort is normal. No respiratory distress.     Breath sounds: Normal breath sounds.  No wheezing or rales.  Chest:     Chest wall: No tenderness.  Abdominal:     General: Bowel sounds are normal. There is no distension or abdominal bruit.     Palpations: Abdomen is soft. There is no hepatomegaly, splenomegaly, mass or pulsatile mass.     Tenderness: There is no abdominal tenderness.  Musculoskeletal:        General: Normal range of motion.     Cervical back: Normal range of motion and neck supple.  Lymphadenopathy:     Cervical: No cervical adenopathy.  Skin:    General: Skin is warm and dry.  Neurological:     Mental Status: She is alert and oriented to person, place, and time.     Deep Tendon Reflexes: Reflexes are normal and symmetric.  Psychiatric:        Behavior: Behavior normal.        Thought Content: Thought content normal.        Judgment: Judgment normal.     BP 128/71   Pulse 69   Temp 98.7 F (37.1 C) (Temporal)   Resp 20   Ht _0  (1.626 m)   Wt 269 lb (122 kg)   LMP 05/24/1986 (Approximate)   SpO2 94%   BMI 46.17 kg/m   Hgba1c 8.7     Assessment & Plan:  Keri Veale comes in today with chief complaint of Medical Management of Chronic Issues   Diagnosis and orders addressed:  1. Essential hypertension Low sodium diet - CMP14+EGFR - atenolol (TENORMIN) 100 MG tablet; Take 1 tablet (100 mg total) by mouth daily.  Dispense: 90 tablet; Refill: 1 - hydrochlorothiazide (HYDRODIURIL) 25 MG tablet; TAKE 1 TABLET (25 MG TOTAL) BY MOUTH DAILY.  Dispense: 90 tablet; Refill: 1  2. Mixed hyperlipidemia Low fat diet - Lipid panel - pravastatin (PRAVACHOL) 40 MG tablet; TAKE 1 TABLET (40 MG TOTAL) BY MOUTH EVERY EVENING.  Dispense: 90 tablet; Refill: 1  3. Type 2 diabetes mellitus with hyperglycemia, with long-term current use of insulin (HCC) cointinue to wtach carbs in diet Increase tresiba to 85u for 1 week then 90u - hgba1c - Insulin Pen Needle (B-D UF III MINI PEN NEEDLES) 31G X 5 MM MISC; USE AS DIRECTED WITH TRESIBA ONCE A DAY  ** E11.9  Dispense: 100 each; Refill: 3 - Insulin Degludec (TRESIBA FLEXTOUCH) 200 UNIT/ML SOPN; Inject 80 Units into the skin daily.  Dispense: 12 pen; Refill: 1 - Dulaglutide (TRULICITY) 1.5 EB/5.8XE SOPN; Inject 1.5 mg into the skin once a week.  Dispense: 6 mL; Refill: 1  4. Hypothyroidism, unspecified type  5. Gastroesophageal reflux disease without esophagitis Avoid spicy foods Do not eat 2 hours prior to bedtime - dexlansoprazole (DEXILANT) 60 MG capsule; TAKE 1 CAPSULE (60 MG TOTAL) BY MOUTH 2 (TWO) TIMES DAILY.  Dispense: 180 capsule; Refill: 1  6. GAD (generalized anxiety disorder) Stress management - clonazePAM (KLONOPIN) 0.5 MG tablet; Take 1 tablet (0.5 mg total) by mouth 2 (two) times daily as needed.  Dispense: 60 tablet; Refill: 3  7. Recurrent major depressive disorder, in full remission ([place]) - escitalopram (LEXAPRO) 20 MG tablet; Take 1 tablet (20 mg total) by mouth daily.  Dispense: 90 tablet; Refill: 1  8. Insomnia, unspecified type Bedtime routine  9. OSA (obstructive sleep apnea) Wear CPAP nghtly  10. Severe obesity (BMI >= 40) (HCC) Discussed diet and exercise for person with BMI >25 Will recheck weight in 3-6 months  11. Hypothyroidism due to medication - levothyroxine (SYNTHROID) 175 MCG tablet; Take 1 tablet (175 mcg total) by mouth daily. Except Sunday.  Takes 200 mg tab on Sunday.  Dispense: 90 tablet; Refill: 1  12. Neuropathy Do not go barefooted - gabapentin (NEURONTIN) 300 MG capsule; Take 1 capsule (300 mg total) by mouth at bedtime.  Dispense: 90 capsule; Refill: 1  13. Primary insomnia Bedtime routine - zolpidem (AMBIEN) 10 MG tablet; TAKE 1 TABLET BY MOUTH EVERY DAY AT BEDTIME AS NEEDED  Dispense: 30 tablet; Refill: 3  Labs pending Health Maintenance reviewed Diet and exercise encouraged  Follow up plan: 3 months   Mary-Margaret Hassell Done, FNP

## 2019-05-23 ENCOUNTER — Ambulatory Visit: Payer: Self-pay | Admitting: Nurse Practitioner

## 2019-05-23 LAB — CMP14+EGFR
ALT: 15 IU/L (ref 0–32)
AST: 24 IU/L (ref 0–40)
Albumin/Globulin Ratio: 1.9 (ref 1.2–2.2)
Albumin: 3.9 g/dL (ref 3.8–4.8)
Alkaline Phosphatase: 93 IU/L (ref 39–117)
BUN/Creatinine Ratio: 18 (ref 12–28)
BUN: 15 mg/dL (ref 8–27)
Bilirubin Total: 0.4 mg/dL (ref 0.0–1.2)
CO2: 30 mmol/L — ABNORMAL HIGH (ref 20–29)
Calcium: 9.1 mg/dL (ref 8.7–10.3)
Chloride: 99 mmol/L (ref 96–106)
Creatinine, Ser: 0.84 mg/dL (ref 0.57–1.00)
GFR calc Af Amer: 85 mL/min/{1.73_m2} (ref >59)
GFR calc non Af Amer: 74 mL/min/{1.73_m2} (ref >59)
Globulin, Total: 2.1 g/dL (ref 1.5–4.5)
Glucose: 204 mg/dL — ABNORMAL HIGH (ref 65–99)
Potassium: 3.8 mmol/L (ref 3.5–5.2)
Sodium: 142 mmol/L (ref 134–144)
Total Protein: 6 g/dL (ref 6.0–8.5)

## 2019-05-23 LAB — LIPID PANEL
Chol/HDL Ratio: 6.1 ratio — ABNORMAL HIGH (ref 0.0–4.4)
Cholesterol, Total: 176 mg/dL (ref 100–199)
HDL: 29 mg/dL — ABNORMAL LOW (ref >39)
LDL Chol Calc (NIH): 106 mg/dL — ABNORMAL HIGH (ref 0–99)
Triglycerides: 234 mg/dL — ABNORMAL HIGH (ref 0–149)
VLDL Cholesterol Cal: 41 mg/dL — ABNORMAL HIGH (ref 5–40)

## 2019-05-28 ENCOUNTER — Other Ambulatory Visit: Payer: Self-pay | Admitting: Nurse Practitioner

## 2019-05-28 ENCOUNTER — Telehealth: Payer: Self-pay | Admitting: Nurse Practitioner

## 2019-05-28 DIAGNOSIS — Z794 Long term (current) use of insulin: Secondary | ICD-10-CM

## 2019-05-28 DIAGNOSIS — E1165 Type 2 diabetes mellitus with hyperglycemia: Secondary | ICD-10-CM

## 2019-05-29 ENCOUNTER — Telehealth: Payer: Self-pay | Admitting: *Deleted

## 2019-05-29 DIAGNOSIS — Z794 Long term (current) use of insulin: Secondary | ICD-10-CM

## 2019-05-29 DIAGNOSIS — E1165 Type 2 diabetes mellitus with hyperglycemia: Secondary | ICD-10-CM

## 2019-05-29 NOTE — Telephone Encounter (Signed)
Tresiba-Insurance rejected saying "Plan Limit exceeded"

## 2019-05-29 NOTE — Telephone Encounter (Signed)
What does that mean- need to take off trulicity? She has been on it for years

## 2019-05-30 MED ORDER — TRESIBA FLEXTOUCH 200 UNIT/ML ~~LOC~~ SOPN: 90.0000 [IU] | PEN_INJECTOR | Freq: Every day | SUBCUTANEOUS | 1 refills | Status: DC

## 2019-05-30 MED ORDER — TRESIBA FLEXTOUCH 200 UNIT/ML ~~LOC~~ SOPN: 80.0000 [IU] | PEN_INJECTOR | Freq: Every day | SUBCUTANEOUS | 1 refills | Status: DC

## 2019-05-30 NOTE — Telephone Encounter (Signed)
Drop back to 80u a day- [facility] in. [facility] Let patient know we are goingback down to 80u, but she is really going to have  To watch carbsin her diet and get that HGBA1c down

## 2019-05-30 NOTE — Addendum Note (Signed)
Addended by: Chevis Pretty on: 05/30/2019 08:24 AM   Modules accepted: Orders

## 2019-05-30 NOTE — Addendum Note (Signed)
Addended by: Milas Hock on: 05/30/2019 01:28 PM   Modules accepted: Orders

## 2019-05-30 NOTE — Telephone Encounter (Signed)
[facility] and spoke with the pharmacy and was told the insurance is not going to fill any Tresiba till 06/22/19 due to the pt picked up rx on 03/22/19 and it was a 3 month supply. The pharmacist said she has wither misplaced a box or is possibly injecting too many units at a time.  I spoke to the patient and she says she still has 5 full pens left and a partial pen left. Pt thinks this will last her till 06/20/19 which is when she should be able to fill it. So I called the pharmacy back and cancelled the 80 units and told them to keep the 90 units daily active for when the pt needs it refilled.

## 2019-05-30 NOTE — Telephone Encounter (Signed)
Being addressed.

## 2019-05-30 NOTE — Telephone Encounter (Signed)
Usually has something to do with the dosage of the medication. Not sure if formulary changed or what? Did you increase the amount of units taken daily or anything like that? On her Tyler Aas not her Trulicity

## 2019-06-15 ENCOUNTER — Other Ambulatory Visit: Payer: Self-pay

## 2019-06-15 ENCOUNTER — Ambulatory Visit: Payer: BC Managed Care – PPO | Attending: Internal Medicine

## 2019-06-15 DIAGNOSIS — Z20822 Contact with and (suspected) exposure to covid-19: Secondary | ICD-10-CM

## 2019-06-16 ENCOUNTER — Other Ambulatory Visit: Payer: Self-pay | Admitting: Nurse Practitioner

## 2019-06-16 DIAGNOSIS — E1165 Type 2 diabetes mellitus with hyperglycemia: Secondary | ICD-10-CM

## 2019-06-16 LAB — NOVEL CORONAVIRUS, NAA: SARS-CoV-2, NAA: NOT DETECTED

## 2019-06-17 ENCOUNTER — Telehealth: Payer: BC Managed Care – PPO | Admitting: Family

## 2019-06-17 ENCOUNTER — Encounter: Payer: Self-pay | Admitting: Nurse Practitioner

## 2019-06-17 ENCOUNTER — Other Ambulatory Visit: Payer: Self-pay | Admitting: Family

## 2019-06-17 DIAGNOSIS — J209 Acute bronchitis, unspecified: Secondary | ICD-10-CM

## 2019-06-17 MED ORDER — AZITHROMYCIN 250 MG PO TABS: ORAL_TABLET | ORAL | 0 refills | Status: AC

## 2019-06-17 MED ORDER — ALBUTEROL SULFATE HFA 108 (90 BASE) MCG/ACT IN AERS: 2.0000 | INHALATION_SPRAY | Freq: Four times a day (QID) | RESPIRATORY_TRACT | 0 refills | Status: DC | PRN

## 2019-06-17 MED ORDER — PREDNISONE 10 MG (21) PO TBPK: ORAL_TABLET | ORAL | 0 refills | Status: AC

## 2019-06-17 MED ORDER — BENZONATATE 100 MG PO CAPS: 100.0000 mg | ORAL_CAPSULE | Freq: Three times a day (TID) | ORAL | 0 refills | Status: AC | PRN

## 2019-06-17 NOTE — Progress Notes (Signed)
PT called and discussed medications that were sent over.

## 2019-06-17 NOTE — Progress Notes (Signed)
We are sorry that you are not feeling well.  Here is how we plan to help!  Based on your presentation I believe you most likely have A cough due to a virus.  This is called viral bronchitis and is best treated by rest, plenty of fluids and control of the cough.  You may use Ibuprofen or Tylenol as directed to help your symptoms.     In addition you may use A non-prescription cough medication called Robitussin DAC. Take 2 teaspoons every 8 hours or Delsym: take 2 teaspoons every 12 hours., A non-prescription cough medication called Mucinex DM: take 2 tablets every 12 hours. and A prescription cough medication called Tessalon Perles 100mg . You may take 1-2 capsules every 8 hours as needed for your cough. I have also sent in albuterol inhaler.   Prednisone 10 mg daily for 6 days (see taper instructions below)  Directions for 6 day taper: Day 1: 2 tablets before breakfast, 1 after both lunch & dinner and 2 at bedtime Day 2: 1 tab before breakfast, 1 after both lunch & dinner and 2 at bedtime Day 3: 1 tab at each meal & 1 at bedtime Day 4: 1 tab at breakfast, 1 at lunch, 1 at bedtime Day 5: 1 tab at breakfast & 1 tab at bedtime Day 6: 1 tab at breakfast   From your responses in the eVisit questionnaire you describe inflammation in the upper respiratory tract which is causing a significant cough.  This is commonly called Bronchitis and has four common causes:    Allergies  Viral Infections  Acid Reflux  Bacterial Infection Allergies, viruses and acid reflux are treated by controlling symptoms or eliminating the cause. An example might be a cough caused by taking certain blood pressure medications. You stop the cough by changing the medication. Another example might be a cough caused by acid reflux. Controlling the reflux helps control the cough.  USE OF BRONCHODILATOR ("RESCUE") INHALERS: There is a risk from using your bronchodilator too frequently.  The risk is that over-reliance on a  medication which only relaxes the muscles surrounding the breathing tubes can reduce the effectiveness of medications prescribed to reduce swelling and congestion of the tubes themselves.  Although you feel brief relief from the bronchodilator inhaler, your asthma may actually be worsening with the tubes becoming more swollen and filled with mucus.  This can delay other crucial treatments, such as oral steroid medications. If you need to use a bronchodilator inhaler daily, several times per day, you should discuss this with your provider.  There are probably better treatments that could be used to keep your asthma under control.     HOME CARE . Only take medications as instructed by your medical team. . Complete the entire course of an antibiotic. . Drink plenty of fluids and get plenty of rest. . Avoid close contacts especially the very young and the elderly . Cover your mouth if you cough or cough into your sleeve. . Always remember to wash your hands . A steam or ultrasonic humidifier can help congestion.   GET HELP RIGHT AWAY IF: . You develop worsening fever. . You become short of breath . You cough up blood. . Your symptoms persist after you have completed your treatment plan MAKE SURE YOU   Understand these instructions.  Will watch your condition.  Will get help right away if you are not doing well or get worse.  Your e-visit answers were reviewed by a board certified advanced  clinical practitioner to complete your personal care plan.  Depending on the condition, your plan could have included both over the counter or prescription medications. If there is a problem please reply  once you have received a response from your provider. Your safety is important to [place].  If you have drug allergies check your prescription carefully.    You can use MyChart to ask questions about today's visit, request a non-urgent call back, or ask for a work or school excuse for 24 hours related to this  e-Visit. If it has been greater than 24 hours you will need to follow up with your provider, or enter a new e-Visit to address those concerns. You will get an e-mail in the next two days asking about your experience.  I hope that your e-visit has been valuable and will speed your recovery. Thank you for using e-visits.  Approximately 5 minutes was spent documenting and reviewing patient's chart.

## 2019-06-17 NOTE — Addendum Note (Signed)
Addended by: Evelina Dun A on: 06/17/2019 09:46 AM   Modules accepted: Orders

## 2019-06-18 ENCOUNTER — Other Ambulatory Visit: Payer: Self-pay | Admitting: *Deleted

## 2019-06-18 DIAGNOSIS — E1165 Type 2 diabetes mellitus with hyperglycemia: Secondary | ICD-10-CM

## 2019-06-18 MED ORDER — TRESIBA FLEXTOUCH 200 UNIT/ML ~~LOC~~ SOPN: 90.0000 [IU] | PEN_INJECTOR | Freq: Every day | SUBCUTANEOUS | 1 refills | Status: AC

## 2019-06-22 ENCOUNTER — Telehealth: Payer: Self-pay | Admitting: Nurse Practitioner

## 2019-06-22 NOTE — Telephone Encounter (Signed)
Contacted patients husband and told him I am not sure how he would go about doing that. Other than to tell them to transfer her

## 2019-06-26 ENCOUNTER — Encounter: Payer: Self-pay | Admitting: *Deleted

## 2019-07-03 ENCOUNTER — Other Ambulatory Visit: Payer: Self-pay | Admitting: Family

## 2019-07-03 DIAGNOSIS — J209 Acute bronchitis, unspecified: Secondary | ICD-10-CM

## 2019-07-26 ENCOUNTER — Other Ambulatory Visit: Payer: Self-pay | Admitting: *Deleted

## 2019-08-23 DEATH — deceased

## 2019-08-24 ENCOUNTER — Ambulatory Visit: Payer: BC Managed Care – PPO | Admitting: Nurse Practitioner

## 2019-08-27 ENCOUNTER — Ambulatory Visit: Payer: BC Managed Care – PPO | Admitting: Nurse Practitioner

## 2019-11-14 ENCOUNTER — Ambulatory Visit: Payer: BLUE CROSS/BLUE SHIELD | Admitting: Obstetrics and Gynecology
